# Patient Record
Sex: Female | Born: 1965 | Race: White | Hispanic: No | Marital: Married
Health system: Midwestern US, Community
[De-identification: ages and names within clinical notes are randomized; demographics above are authoritative.]

## PROBLEM LIST (undated history)

## (undated) DIAGNOSIS — R0602 Shortness of breath: Secondary | ICD-10-CM

## (undated) DIAGNOSIS — R251 Tremor, unspecified: Secondary | ICD-10-CM

## (undated) DIAGNOSIS — R52 Pain, unspecified: Secondary | ICD-10-CM

## (undated) DIAGNOSIS — K224 Dyskinesia of esophagus: Secondary | ICD-10-CM

## (undated) DIAGNOSIS — M545 Low back pain, unspecified: Secondary | ICD-10-CM

## (undated) DIAGNOSIS — J321 Chronic frontal sinusitis: Secondary | ICD-10-CM

## (undated) DIAGNOSIS — Z1231 Encounter for screening mammogram for malignant neoplasm of breast: Secondary | ICD-10-CM

## (undated) DIAGNOSIS — G4733 Obstructive sleep apnea (adult) (pediatric): Secondary | ICD-10-CM

## (undated) DIAGNOSIS — M810 Age-related osteoporosis without current pathological fracture: Secondary | ICD-10-CM

## (undated) DIAGNOSIS — M25552 Pain in left hip: Secondary | ICD-10-CM

## (undated) DIAGNOSIS — G250 Essential tremor: Secondary | ICD-10-CM

## (undated) DIAGNOSIS — M79605 Pain in left leg: Secondary | ICD-10-CM

## (undated) DIAGNOSIS — M5126 Other intervertebral disc displacement, lumbar region: Secondary | ICD-10-CM

## (undated) DIAGNOSIS — F419 Anxiety disorder, unspecified: Secondary | ICD-10-CM

## (undated) DIAGNOSIS — M5020 Other cervical disc displacement, unspecified cervical region: Secondary | ICD-10-CM

---

## 2010-01-09 LAB — CBC WITH AUTOMATED DIFF
ABS. BASOPHILS: 0 10*3/uL (ref 0.0–0.1)
ABS. EOSINOPHILS: 0.1 10*3/uL (ref 0.0–0.5)
ABS. LYMPHOCYTES: 2.1 10*3/uL (ref 0.8–3.5)
ABS. MONOCYTES: 0.5 10*3/uL — ABNORMAL LOW (ref 0.8–3.5)
ABS. NEUTROPHILS: 5.6 10*3/uL (ref 1.5–8.0)
BASOPHILS: 0 % (ref 0–2)
EOSINOPHILS: 1 % (ref 0–5)
HCT: 40.9 % — ABNORMAL LOW (ref 41–53)
HGB: 14.4 g/dL (ref 12.0–16.0)
LYMPHOCYTES: 25 % (ref 19–48)
MCH: 32.3 PG — ABNORMAL HIGH (ref 27–31)
MCHC: 35.3 g/dL (ref 31–37)
MCV: 91.7 FL (ref 80–100)
MONOCYTES: 6 % (ref 3–9)
MPV: 6.7 FL (ref 5.9–10.3)
NEUTROPHILS: 67 % (ref 40–74)
PLATELET: 226 10*3/uL (ref 130–400)
RBC: 4.46 M/uL (ref 4.2–5.4)
RDW: 14 % (ref 11.5–14.5)
WBC: 8.3 10*3/uL (ref 4.5–10.8)

## 2010-01-09 LAB — HEPATIC FUNCTION PANEL
A-G Ratio: 0.9 — ABNORMAL LOW (ref 1.2–2.2)
ALT (SGPT): 23 U/L — ABNORMAL LOW (ref 30–65)
AST (SGOT): 9 U/L — ABNORMAL LOW (ref 15–37)
Albumin: 3.7 g/dL (ref 3.4–5.0)
Alk. phosphatase: 102 U/L (ref 50–136)
Bilirubin, direct: 0.1 MG/DL (ref 0.0–0.2)
Bilirubin, total: 0.7 MG/DL (ref 0.2–1.0)
Globulin: 4 g/dL — ABNORMAL HIGH (ref 2.4–3.5)
Protein, total: 7.7 g/dL (ref 6.4–8.2)

## 2010-01-09 LAB — AMYLASE: Amylase: 53 U/L (ref 25–115)

## 2010-01-09 LAB — LIPASE: Lipase: 276 U/L (ref 114–286)

## 2010-01-09 LAB — METABOLIC PANEL, BASIC
Anion gap: 8 mmol/L (ref 6–15)
BUN/Creatinine ratio: 9 (ref 7–25)
BUN: 6 MG/DL — ABNORMAL LOW (ref 7–18)
CO2: 28 MMOL/L (ref 21–32)
Calcium: 9.2 MG/DL (ref 8.5–10.1)
Chloride: 100 MMOL/L (ref 98–107)
Creatinine: 0.7 MG/DL (ref 0.6–1.3)
GFR est AA: >60 mL/min/{1.73_m2} (ref >60)
GFR est non-AA: >60 mL/min/{1.73_m2} (ref >60)
Glucose: 89 MG/DL (ref 70–110)
Potassium: 3.5 MMOL/L (ref 3.5–5.1)
Sodium: 135 MMOL/L — ABNORMAL LOW (ref 136–145)

## 2010-01-17 LAB — C DIFFICILE TOXIN BY EIA: C. difficile toxin - EIA: NEGATIVE

## 2010-01-20 LAB — CULTURE, STOOL

## 2010-01-21 LAB — OVA & PARASITES, STOOL
O&P, Trichrome stain: NEGATIVE
Ova & Parasite exam: NEGATIVE

## 2010-01-25 NOTE — Op Note (Unsigned)
[facility]      PT Name: Amber [name], Amber [name] Admitted: 01/25/2010  MR#: [id number] DOB: [birth year]  Account #: [id number] Age: 44  Dictator: Ned Grace, MD Location:          OPERATIVE REPORT    SURGERY DATE: 01/25/2010    PREOPERATIVE DIAGNOSIS:  Rule out ulcer. Rule out colitis. Rule out malignancy.    POSTOPERATIVE DIAGNOSIS:  1) Gastric bezoar. 2) Internal hemorrhoids.    OPERATION:  1) Esophagogastroduodenoscopy with biopsy.  2) Colonoscopy with biopsy.    INDICATIONS:  Nausea. Vomiting. Abdominal pain. Diarrhea.    MEDICATIONS:  Fentanyl 100 mcg IV push. Versed 6 mg IV push.    FINDINGS:  Following informed consent, the video gastroscope was advanced under  direct vision into the esophagus. The tubular esophagus was  endoscopically normal. There was no significant hiatal hernia  present. The scope was passed into the gastric lumen. There was a  large gastric bezoar along the greater curve. The visualized  gastric mucosa was unremarkable and without inflammatory change.  The pylorus was unremarkable. Scope was passed into the duodenal  bulb and descending duodenum. The duodenal mucosa was normal.  Biopsies were taken of the duodenal mucosa to rule out sprue. The  gastroscope was removed.    Colonoscope was placed and advanced to the cecum. Cecum was  identified by visualization of the appendiceal orifice and ileocecal  valve. The cecum, right colon, transverse colon, descending colon,  sigmoid region and rectum were unremarkable. No polyps, mass  lesions or inflammatory changes were noted. No significant  diverticulosis was seen. Random biopsies were taken to rule out  microscopic colitis. A retroflex examination of the rectum  revealed small internal hemorrhoids. The patient tolerated the  procedure without any obvious complication.    IMPRESSION:  1) Gastric bezoar suggesting underlying gastroparesis.  2) Hemorrhoids.      RECOMMENDATIONS:  1) Gastric Emptying Scan will be obtained.   2) She can follow up in our office in 3-4 weeks.          _____________________________  Ned Grace, M.D.    RM:df DD: 01/25/2010 09:12:47 DT: 01/25/2010 13:18:23 Job ID:  [id number]  CC:Dr. Chales Abrahams

## 2010-01-25 NOTE — Op Note (Unsigned)
[facility]      PT Name: Amber [name], Amber [name] Admitted: 01/25/2010  MR#: [id number] DOB: 24-Jun-1966  Account #: [id number] Age: 44  Dictator: Ned Grace, MD Location:          OPERATIVE REPORT    SURGERY DATE: 01/25/2010    PREOPERATIVE DIAGNOSIS:  Rule out ulcer. Rule out colitis. Rule out malignancy.    POSTOPERATIVE DIAGNOSIS:  1) Gastric bezoar. 2) Internal hemorrhoids.    OPERATION:  1) Esophagogastroduodenoscopy with biopsy.  2) Colonoscopy with biopsy.    INDICATIONS:  Nausea. Vomiting. Abdominal pain. Diarrhea.    MEDICATIONS:  Fentanyl 100 mcg IV push. Versed 6 mg IV push.    FINDINGS:  Following informed consent, the video gastroscope was advanced under  direct vision into the esophagus. The tubular esophagus was  endoscopically normal. There was no significant hiatal hernia  present. The scope was passed into the gastric lumen. There was a  large gastric bezoar along the greater curve. The visualized  gastric mucosa was unremarkable and without inflammatory change.  The pylorus was unremarkable. Scope was passed into the duodenal  bulb and descending duodenum. The duodenal mucosa was normal.  Biopsies were taken of the duodenal mucosa to rule out sprue. The  gastroscope was removed.    Colonoscope was placed and advanced to the cecum. Cecum was  identified by visualization of the appendiceal orifice and ileocecal  valve. The cecum, right colon, transverse colon, descending colon,  sigmoid region and rectum were unremarkable. No polyps, mass  lesions or inflammatory changes were noted. No significant  diverticulosis was seen. Random biopsies were taken to rule out  microscopic colitis. A retroflex examination of the rectum  revealed small internal hemorrhoids. The patient tolerated the  procedure without any obvious complication.    IMPRESSION:  1) Gastric bezoar suggesting underlying gastroparesis.  2) Hemorrhoids.      RECOMMENDATIONS:  1) Gastric Emptying Scan will be obtained.   2) She can follow up in our office in 3-4 weeks.         Electronically signed by Ned Grace, M.D.  on 01/25/2010 15:15:32        _____________________________  Ned Grace, M.D.    RM:df DD: 01/25/2010 09:12:47 DT: 01/25/2010 13:18:23 Job ID:  [id number]  CC:Dr. Chales Abrahams

## 2010-03-15 NOTE — Procedures (Unsigned)
[facility]      PT Name: Amber [name], Amber [name] Admitted: 03/13/2010  MR#: [id number] DOB: [birth year]  Account #: [id number] Age: 44  Dictator: Elayne Snare, MD Location:      DOB: [birth year] Age: 43Y     BLOOD MONITOR REPORT      DATE: 03/13/2010    PROCEDURE:  Patient was monitored from 03/13/2010 at 1 p.m. to 03/14/2010 6:45  p.m. Total of 23 out of 30 was accepted.    FINDINGS:  Throughout monitoring blood pressure was well maintained. Maximum  systolic blood pressure was 127 with diastolic of 91 at 12 p.m. The  rest of the time blood pressure was fairly well controlled. Heart  rate was well controlled from 70 to 100 beats per minute.    CONCLUSION:  Fairly well blood pressure control throughout monitoring. Maximum  blood pressure was 127/91. Her lowest blood pressure noted was  82/51 at 8 p.m. with heart rate of 61. The rest of the time, blood  pressure was within normal limits.          _______________________________________  Elayne Snare, M.D.    YP:tds DD: 03/15/2010 12:54:49 DT: 03/15/2010 17:03:28 Job ID:  [id number]    CC:        Cira Rue  [id number]  03/13/2010 Admission        Page 1 HOLTER MONITOR REPORT

## 2010-03-23 NOTE — Procedures (Unsigned)
[facility]      PT Name: Amber [name], Amber [name] Admitted: 03/23/2010  MR#: [id number] DOB: Sep 10, 1966  Account #: [id number] Age: 44  Dictator: Elon Jester A. Shacora Zynda, MD Location:         Cardiology - GXT - DRUG INDUCED TEST  Reason for GXT: Hypertension, chest pain  Medications: Xanax, BuSpar, Lisinopril, Prilosec  Allergies: PENICILLIN, TOPAMAX, CIPRO, MIGRAINE SPRAY  INTERPRETATION OF STRESS TEST:  Protocol: Lexiscan Age: 43Y Height: 65" Weight: 172  Sex: F Race:    * * * LEXISCAN 0.4 MG ADMIN. * * *    * * * POST EXERCISE RESPONSE * * *    HEART - - PREDICTED MHR: MHR ACHIEVED: % PEAK HR ACHIEVED:  TOTAL TIME: TARGET (85% MHR): STAGE: TIME:    SYMPTOMS/REASON FOR STOPPING: No chest pain.  PRE-EXERCISE ECG: Normal sinus rhythm. PRNP, no acute STT  changes.  EKG RESPONSE: No significant ST segment changes noted. No  arrhythmia.  PEAK ACTIVITY LEVEL: METS. MAXIMUM 02 CONSUMPTION (VO2):  INDEX OF CARDIAC WORK: FITNESS CLASSIFICATION:    INTERPRETATION:  1. Negative electrocardiographic test in response to Lexiscan  2. Myoview distribution report is pending.        _______________________  Sallyanne Kuster. Dwayna Kentner, M.D.    MAF:tds DD: 03/23/2010 00:00:00 DT: 03/23/2010 13:41:25    CC:

## 2010-05-27 LAB — HCG QL SERUM: HCG, Ql.: NEGATIVE

## 2010-05-27 LAB — CBC WITH AUTOMATED DIFF
ABS. BASOPHILS: 0.2 10*3/uL — ABNORMAL HIGH (ref 0.0–0.1)
ABS. EOSINOPHILS: 0 10*3/uL (ref 0.0–0.5)
ABS. LYMPHOCYTES: 1.3 10*3/uL (ref 0.8–3.5)
ABS. MONOCYTES: 0.3 10*3/uL — ABNORMAL LOW (ref 0.8–3.5)
ABS. NEUTROPHILS: 8.1 10*3/uL — ABNORMAL HIGH (ref 1.5–8.0)
BASOPHILS: 2 % (ref 0–2)
EOSINOPHILS: 0 % (ref 0–5)
HCT: 43.6 % (ref 41–53)
HGB: 15.4 g/dL (ref 12.0–16.0)
LYMPHOCYTES: 13 % — ABNORMAL LOW (ref 19–48)
MCH: 31.9 PG — ABNORMAL HIGH (ref 27–31)
MCHC: 35.3 g/dL (ref 31–37)
MCV: 90.5 FL (ref 80–100)
MONOCYTES: 3 % (ref 3–9)
MPV: 6.6 FL (ref 5.9–10.3)
NEUTROPHILS: 82 % — ABNORMAL HIGH (ref 40–74)
PLATELET: 227 10*3/uL (ref 130–400)
RBC: 4.82 M/uL (ref 4.2–5.4)
RDW: 12.7 % (ref 11.5–14.5)
WBC: 9.9 10*3/uL (ref 4.5–10.8)

## 2010-05-27 LAB — URINE MICROSCOPIC

## 2010-05-27 LAB — URINALYSIS W/ RFLX MICROSCOPIC
Blood: NEGATIVE
Glucose: NEGATIVE MG/DL
Ketone: >80 MG/DL — AB
Leukocyte Esterase: NEGATIVE
Nitrites: NEGATIVE
Specific gravity: 1.025 (ref 1.002–1.030)
Urobilinogen: 0.2 EU/DL (ref 0–1)
pH (UA): 6 (ref 4.5–8.0)

## 2010-05-27 LAB — METABOLIC PANEL, BASIC
Anion gap: 11 mmol/L (ref 6–15)
BUN/Creatinine ratio: 8 (ref 7–25)
BUN: 5 MG/DL — ABNORMAL LOW (ref 7–18)
CO2: 21 MMOL/L (ref 21–32)
Calcium: 9.5 MG/DL (ref 8.5–10.1)
Chloride: 104 MMOL/L (ref 98–107)
Creatinine: 0.6 MG/DL (ref 0.6–1.3)
GFR est AA: >60 mL/min/{1.73_m2} (ref >60)
GFR est non-AA: >60 mL/min/{1.73_m2} (ref >60)
Glucose: 98 MG/DL (ref 70–110)
Potassium: 3.9 MMOL/L (ref 3.5–5.1)
Sodium: 136 MMOL/L (ref 136–145)

## 2010-06-04 NOTE — Progress Notes (Signed)
Quick Note:    Pt of Dr Loreen Freud, report sent to hom  ______

## 2010-08-09 LAB — DRUG SCREEN, URINE
AMPHETAMINES: NEGATIVE
BARBITURATES: POSITIVE
BENZODIAZEPINES: POSITIVE
COCAINE: NEGATIVE
METHADONE: NEGATIVE
OPIATES: POSITIVE
PCP(PHENCYCLIDINE): NEGATIVE
THC (TH-CANNABINOL): POSITIVE
TRICYCLICS: NEGATIVE

## 2010-08-09 LAB — URINALYSIS W/ RFLX MICROSCOPIC
Bilirubin: NEGATIVE
Blood: NEGATIVE
Glucose: NEGATIVE MG/DL
Ketone: NEGATIVE MG/DL
Leukocyte Esterase: NEGATIVE
Nitrites: NEGATIVE
Protein: NEGATIVE MG/DL
Specific gravity: 1.025 (ref 1.002–1.030)
Urobilinogen: 1 EU/DL (ref 0–1)
pH (UA): 6 (ref 4.5–8.0)

## 2010-08-09 LAB — METABOLIC PANEL, BASIC
Anion gap: 11 mmol/L (ref 6–15)
BUN/Creatinine ratio: 15 (ref 7–25)
BUN: 9 MG/DL (ref 7–18)
CO2: 25 MMOL/L (ref 21–32)
Calcium: 8.7 MG/DL (ref 8.5–10.1)
Chloride: 102 MMOL/L (ref 98–107)
Creatinine: 0.6 MG/DL (ref 0.6–1.3)
GFR est AA: >60 mL/min/{1.73_m2} (ref >60)
GFR est non-AA: >60 mL/min/{1.73_m2} (ref >60)
Glucose: 90 MG/DL (ref 70–110)
Potassium: 4.2 MMOL/L (ref 3.5–5.1)
Sodium: 138 MMOL/L (ref 136–145)

## 2010-08-09 LAB — URINE MICROSCOPIC

## 2010-08-09 LAB — CBC WITH AUTOMATED DIFF
ABS. BASOPHILS: 0.1 10*3/uL (ref 0.0–0.1)
ABS. EOSINOPHILS: 0 10*3/uL (ref 0.0–0.5)
ABS. LYMPHOCYTES: 1.9 10*3/uL (ref 0.8–3.5)
ABS. MONOCYTES: 0.6 10*3/uL — ABNORMAL LOW (ref 0.8–3.5)
ABS. NEUTROPHILS: 7 10*3/uL (ref 1.5–8.0)
BASOPHILS: 1 % (ref 0–2)
EOSINOPHILS: 0 % (ref 0–5)
HCT: 37.2 % — ABNORMAL LOW (ref 41–53)
HGB: 13.1 g/dL (ref 12.0–16.0)
LYMPHOCYTES: 20 % (ref 19–48)
MCH: 31.5 PG — ABNORMAL HIGH (ref 27–31)
MCHC: 35.2 g/dL (ref 31–37)
MCV: 89.5 FL (ref 80–100)
MONOCYTES: 7 % (ref 3–9)
MPV: 7.3 FL (ref 5.9–10.3)
NEUTROPHILS: 72 % (ref 40–74)
PLATELET: 190 10*3/uL (ref 130–400)
RBC: 4.15 M/uL — ABNORMAL LOW (ref 4.2–5.4)
RDW: 13 % (ref 11.5–14.5)
WBC: 9.7 10*3/uL (ref 4.5–10.8)

## 2010-08-09 LAB — LIPASE: Lipase: 160 U/L (ref 73–393)

## 2010-08-09 LAB — HEPATIC FUNCTION PANEL
A-G Ratio: 0.7 — ABNORMAL LOW (ref 1.2–2.2)
ALT (SGPT): 20 U/L — ABNORMAL LOW (ref 30–65)
AST (SGOT): 13 U/L — ABNORMAL LOW (ref 15–37)
Albumin: 3 g/dL — ABNORMAL LOW (ref 3.4–5.0)
Alk. phosphatase: 100 U/L (ref 50–136)
Bilirubin, direct: <0.1 MG/DL (ref 0.0–0.2)
Bilirubin, total: 0.1 MG/DL — ABNORMAL LOW (ref 0.2–1.0)
Globulin: 4.1 g/dL — ABNORMAL HIGH (ref 2.4–3.5)
Protein, total: 7.1 g/dL (ref 6.4–8.2)

## 2010-08-09 LAB — HCG QL SERUM: HCG, Ql.: NEGATIVE

## 2010-08-09 MED ORDER — SALINE PERIPHERAL FLUSH Q8H
Freq: Three times a day (TID) | INTRAMUSCULAR | Status: DC
Start: 2010-08-09 — End: 2010-08-09

## 2010-08-09 MED ORDER — ONDANSETRON (PF) 4 MG/2 ML INJECTION
4 mg/2 mL | Freq: Once | INTRAMUSCULAR | Status: AC
Start: 2010-08-09 — End: 2010-08-09
  Administered 2010-08-09: 22:00:00 via INTRAVENOUS

## 2010-08-09 MED ORDER — SALINE PERIPHERAL FLUSH PRN
INTRAMUSCULAR | Status: DC | PRN
Start: 2010-08-09 — End: 2010-08-09

## 2010-08-09 NOTE — ED Notes (Deleted)
Presents to triage with c/o abdominal pain RUQ and nausea since Monday.

## 2010-08-09 NOTE — Progress Notes (Signed)
I have reviewed discharge instructions with the patient.  The patient verbalized understanding.

## 2010-08-09 NOTE — ED Notes (Signed)
Presents to triage with c/o menstrual cramps x 1 month. Also c/o rectal bleeding secondary to IBS.

## 2010-08-09 NOTE — Progress Notes (Signed)
Ct abd/pelvis complete 6:23 PM  Amber [name] J Jmya Uliano

## 2010-08-10 NOTE — ED Provider Notes (Signed)
Patient is a 44 y.o. female presenting with abdominal pain and anal bleeding. The history is provided by the patient. No language interpreter was used.   Abdominal Pain   This is a new problem. The current episode started more than 1 week ago. The problem occurs daily. The problem has not changed since onset. The pain is located in the generalized abdominal region. The quality of the pain is dull. The pain is mild. Pertinent negatives include no fever, no diarrhea, no flatus, no melena, no nausea, no vomiting, no dysuria, no frequency, no hematuria, no headaches, no chest pain and no back pain. Nothing worsens the pain. The pain is relieved by nothing. Past workup includes no CT scan, no ultrasound, no surgery, no esophagogastroduodenoscopy, no UGI, no colonoscopy and no barium enema. Her past medical history does not include PUD, gallstones, irritable bowel syndrome, cancer or kidney stones.   Rectal Bleeding  This is a new problem. The problem occurs daily. The problem has not changed since onset. Associated symptoms include abdominal pain. Pertinent negatives include no chest pain, no headaches and no shortness of breath. Nothing aggravates the symptoms. Nothing relieves the symptoms.        Past Medical History   Diagnosis Date   ??? Neuropathy, diabetic    ??? Neuropathy      gastoparesis   ??? IBS (irritable bowel syndrome)    ??? HTN (hypertension)           Past Surgical History   Procedure Date   ??? Delivery c-section    ??? Hx tubal ligation    ??? Hx cholecystectomy            No family history on file.     History   Social History   ??? Marital Status: Married     Spouse Name: N/A     Number of Children: N/A   ??? Years of Education: N/A   Occupational History   ??? Not on file.   Social History Main Topics   ??? Smoking status: Current Everyday Smoker -- 1.0 packs/day for 27 years   ??? Smokeless tobacco: Never Used   ??? Alcohol Use: No   ??? Drug Use: 6 per week     Special: Opiates, Marijuana      vicodin or lortab    ??? Sexually Active: Yes -- Female partner(s)     Birth Control/ Protection: Surgical   Other Topics Concern   ??? Not on file   Social History Narrative   ??? No narrative on file                    ALLERGIES: Topamax, Nasal spray, Ciprofloxacin, Aspirin, Pcn and Levsin      Review of Systems   Constitutional: Negative for fever, chills and diaphoresis.   HENT: Negative for hearing loss, ear pain, facial swelling, trouble swallowing, neck pain, neck stiffness and ear discharge.    Eyes: Negative for pain, redness and visual disturbance.   Respiratory: Negative for cough, choking, chest tightness, shortness of breath and wheezing.    Cardiovascular: Negative for chest pain, palpitations, leg swelling and syncope.   Gastrointestinal: Positive for abdominal pain and anal bleeding. Negative for nausea, vomiting, diarrhea, blood in stool, melena, abdominal distention and flatus.   Genitourinary: Negative for dysuria, urgency, frequency, hematuria, flank pain and difficulty urinating.   Musculoskeletal: Negative for back pain.   Skin: Negative for pallor and rash.   Neurological: Negative for seizures, syncope, weakness,  light-headedness and headaches.   Hematological: Negative for adenopathy. Does not bruise/bleed easily.   Psychiatric/Behavioral: Negative for behavioral problems, confusion and agitation.   All other systems reviewed and are negative.        Filed Vitals:    08/09/10 1648 08/09/10 2115   BP: 122/91 120/88   Pulse: 107    Temp: 97.8 ??F (36.6 ??C) 98.6 ??F (37 ??C)   Resp: 16 16   Height: 5\' 5"  (1.651 m)    Weight: 149 lb (67.586 kg)    SpO2: 100% 99%              Physical Exam   Nursing note and vitals reviewed.  Constitutional: She is oriented to person, place, and time. She appears well-developed and well-nourished. No distress.   HENT:   Head: Normocephalic and atraumatic.   Nose: Nose normal.    Eyes: Conjunctivae and EOM are normal. Pupils are equal, round, and reactive to light. Right eye exhibits no discharge. Left eye exhibits no discharge. No scleral icterus.   Neck: Normal range of motion. Neck supple. No tracheal deviation present.   Cardiovascular: Normal rate, regular rhythm, normal heart sounds and intact distal pulses.  Exam reveals no gallop and no friction rub.    No murmur heard.  Pulmonary/Chest: Effort normal and breath sounds normal. No respiratory distress. She has no wheezes. She has no rales.   Abdominal: Soft. Bowel sounds are normal. She exhibits no distension and no mass. No tenderness. She has no guarding.   Musculoskeletal: Normal range of motion. She exhibits no edema and no tenderness.   Lymphadenopathy:     She has no cervical adenopathy.   Neurological: She is alert and oriented to person, place, and time. She has normal reflexes. No cranial nerve deficit. Coordination normal.   Skin: Skin is warm and dry. No rash noted.   Psychiatric: She has a normal mood and affect. Her behavior is normal.        MDM     Amount and/or Complexity of Data Reviewed:   Clinical lab tests:  Ordered and reviewed  Tests in the medicine section of the CPT??:  Reviewed and ordered  Discussion of test results with the performing providers:  No   Decide to obtain previous medical records or to obtain history from someone other than the patient:  Yes   Obtain history from someone other than the patient:  Yes   Review and summarize past medical records:  Yes   Discuss the patient with another provider:  No   Independant visualization of image, tracing, or specimen:  No      Procedures

## 2010-08-21 MED ORDER — DEXAMETHASONE SODIUM PHOSPHATE 10 MG/ML IJ SOLN
10 mg/mL | Freq: Once | INTRAMUSCULAR | Status: AC
Start: 2010-08-21 — End: 2010-08-21
  Administered 2010-08-21: 06:00:00 via INTRAVENOUS

## 2010-08-21 MED ORDER — DIPHENHYDRAMINE HCL 50 MG/ML IJ SOLN
50 mg/mL | Freq: Once | INTRAMUSCULAR | Status: AC
Start: 2010-08-21 — End: 2010-08-21
  Administered 2010-08-21: 06:00:00 via INTRAVENOUS

## 2010-08-21 MED ORDER — PREDNISONE 10 MG TABLETS IN A DOSE PACK
10 mg | ORAL_TABLET | ORAL | Status: DC
Start: 2010-08-21 — End: 2011-08-28

## 2010-08-21 NOTE — ED Notes (Signed)
Patient c/o itching and hives X 2 days.  Patient had a seizure and was taken to [facility] on Sunday.  Patient states she doesn't know if the hives are from her nerves or from a sheet she had over her at [facility].

## 2010-08-21 NOTE — ED Provider Notes (Signed)
HPI Comments: Pt c/o hives for two days. She was seen at [facility] for a seizure 2 days ago. She reports that she was not given any medications. She reports hives started on her way home from the hospital. She has been using benadryl at home since.     Patient is a 44 y.o. female presenting with allergic reaction. The history is provided by the patient.   Allergic Reaction  This is a new problem. The current episode started 2 days ago. The problem occurs constantly. The problem has been gradually worsening. Pertinent negatives include no chest pain, no abdominal pain, no headaches and no shortness of breath. Nothing aggravates the symptoms. Nothing relieves the symptoms. She has tried Benadryl for the symptoms. The treatment provided no relief.        Past Medical History   Diagnosis Date   ??? Neuropathy, diabetic    ??? Neuropathy      gastoparesis   ??? IBS (irritable bowel syndrome)    ??? HTN (hypertension)           Past Surgical History   Procedure Date   ??? Delivery c-section    ??? Hx tubal ligation    ??? Hx cholecystectomy            No family history on file.     History   Social History   ??? Marital Status: Married     Spouse Name: N/A     Number of Children: N/A   ??? Years of Education: N/A   Occupational History   ??? Not on file.   Social History Main Topics   ??? Smoking status: Current Everyday Smoker -- 1.0 packs/day for 27 years   ??? Smokeless tobacco: Never Used   ??? Alcohol Use: No   ??? Drug Use: 6 per week     Special: Opiates, Marijuana      vicodin or lortab   ??? Sexually Active: Yes -- Female partner(s)     Birth Control/ Protection: Surgical   Other Topics Concern   ??? Not on file   Social History Narrative   ??? No narrative on file                    ALLERGIES: Topamax, Nasal spray, Ciprofloxacin, Aspirin, Pcn and Levsin      Review of Systems   Constitutional: Negative for fever, chills, appetite change, fatigue and unexpected weight change.    HENT: Negative for ear pain, congestion, rhinorrhea, drooling, neck pain, neck stiffness, dental problem, sinus pressure and ear discharge.    Eyes: Negative for pain, discharge, redness, itching and visual disturbance.   Respiratory: Negative for cough, chest tightness, shortness of breath and wheezing.    Cardiovascular: Negative for chest pain, palpitations and leg swelling.   Gastrointestinal: Negative for abdominal pain, constipation, blood in stool, abdominal distention and anal bleeding.   Genitourinary: Negative for urgency, frequency, hematuria, flank pain and difficulty urinating.   Musculoskeletal: Negative for myalgias, back pain, joint swelling, arthralgias and gait problem.   Skin: Positive for color change and rash ( hives).   Neurological: Negative for dizziness, seizures, syncope, speech difficulty, weakness, numbness and headaches.   Hematological: Negative for adenopathy. Does not bruise/bleed easily.   Psychiatric/Behavioral: Negative for hallucinations, behavioral problems, confusion, sleep disturbance, self-injury and agitation. The patient is not nervous/anxious.        Filed Vitals:    08/21/10 0125   BP: 133/76   Pulse: 110  Temp: 97.9 ??F (36.6 ??C)   Resp: 22   Height: 5\' 5"  (1.651 m)   Weight: 147 lb (66.679 kg)   SpO2: 100%              Physical Exam   Nursing note and vitals reviewed.  Constitutional: She is oriented to person, place, and time. Vital signs are normal. She appears well-developed and well-nourished.   HENT:   Head: Normocephalic and atraumatic.   Right Ear: External ear normal.   Left Ear: External ear normal.   Nose: Nose normal.   Mouth/Throat: Oropharynx is clear and moist.   Eyes: Conjunctivae, EOM and lids are normal. Pupils are equal, round, and reactive to light.   Neck: Normal range of motion and full passive range of motion without pain. Neck supple. No Brudzinski's sign and no Kernig's sign noted. No mass and no thyromegaly present.    Cardiovascular: Normal rate, regular rhythm, normal heart sounds, intact distal pulses and normal pulses.    Pulmonary/Chest: Effort normal and breath sounds normal. No accessory muscle usage. No respiratory distress. She has no decreased breath sounds. She has no wheezes. She has no rhonchi. She has no rales.   Abdominal: Soft. Bowel sounds are normal. There is no hepatosplenomegaly. No tenderness. She has no rigidity, no rebound, no guarding, no CVA tenderness, no pain at McBurney's point and no Murphy's sign. No hernia.   Musculoskeletal: Normal range of motion.        Right shoulder: She exhibits normal range of motion, no swelling, no effusion, normal pulse and normal strength.   Lymphadenopathy:     She has no cervical adenopathy.   Neurological: She is alert and oriented to person, place, and time. She has normal strength and normal reflexes.   Skin: Skin is warm, dry and intact. Rash noted. Rash is urticarial.         Psychiatric: She has a normal mood and affect. Her behavior is normal. Judgment and thought content normal.        MDM     Amount and/or Complexity of Data Reviewed:    Discuss the patient with another provider:  Yes  Progress:   Patient progress:  Stable      Procedures

## 2010-08-21 NOTE — ED Notes (Signed)
Discharge instructions and prescription given, patient verbalized her understanding.

## 2010-08-21 NOTE — ED Provider Notes (Signed)
I personally saw and examined the patient.  I have reviewed and agree with the MLP's findings, including all diagnostic interpretations, and plans as written.   I was present during the key portions of separately billed procedures.    Kurstin Dimarzo, MD

## 2011-01-14 NOTE — Patient Instructions (Signed)
[facility] office hours are Monday-Friday 8 am to 5 pm. Our phone number is [phone] and the fax is [phone]. Please bring all medication bottles to each office visit.    Please give at least a 48 hour notice on any medication refills.  Thank you.    An After Visit Summary was printed and given to the patient.    MyChart Activation    Thank you for requesting access to MyChart. Please follow the instructions below to securely access and download your online medical record. MyChart allows you to send messages to your doctor, view your test results, renew your prescriptions, schedule appointments, and more.    How Do I Sign Up?    1. In your internet browser, go to [email or web address].  2. Click on the First Time User? Click Here link in the Sign In box. You will see the New Member Sign Up page.  3. Enter your MyChart Access Code exactly as it appears below. You will not need to use this code after you???ve completed the sign-up process. If you do not sign up before the expiration date, you must request a new code.    MyChart Access Code: [email or web address]  Expires: 04/14/11 02:15 PM (This is the date your MyChart access code will expire)    4. Enter the last four digits of your Social Security Number (xxxx) and Date of Birth (mm/dd/yyyy) as indicated and click Submit. You will be taken to the next sign-up page.  5. Create a MyChart ID. This will be your MyChart login ID and cannot be changed, so think of one that is secure and easy to remember.  6. Create a MyChart password. You can change your password at any time.  7. Enter your Password Reset Question and Answer. This can be used at a later time if you forget your password.   8. Enter your e-mail address. You will receive e-mail notification when new information is available in MyChart.  9. Click Sign Up. You can now view and download portions of your medical record.   10. Click the Download Summary menu link to download a portable copy of your medical information.    Additional Information    If you have questions, please call [phone]. Remember, MyChart is NOT to be used for urgent needs. For medical emergencies, dial 911.

## 2011-01-14 NOTE — Progress Notes (Signed)
A user error has taken place: encounter opened in error, closed for administrative reasons.

## 2011-03-11 NOTE — Progress Notes (Signed)
Screening mammogram performed without complication.

## 2011-03-27 LAB — METABOLIC PANEL, COMPREHENSIVE
A-G Ratio: 1.2 (ref 1.2–2.2)
ALT (SGPT): 16 U/L (ref 12–78)
AST (SGOT): 10 U/L — ABNORMAL LOW (ref 15–37)
Albumin: 4.2 g/dL (ref 3.4–5.0)
Alk. phosphatase: 132 U/L (ref 50–136)
Anion gap: 6 mmol/L (ref 6–15)
BUN/Creatinine ratio: 8 (ref 7–25)
BUN: 4 MG/DL — ABNORMAL LOW (ref 7–18)
Bilirubin, total: 0.5 MG/DL (ref <0.8)
CO2: 29 MMOL/L (ref 21–32)
Calcium: 8.8 MG/DL (ref 8.5–10.1)
Chloride: 101 MMOL/L (ref 98–107)
Creatinine: 0.5 MG/DL — ABNORMAL LOW (ref 0.6–1.3)
GFR est AA: >60 mL/min/{1.73_m2} (ref >60)
GFR est non-AA: >60 mL/min/{1.73_m2} (ref >60)
Globulin: 3.6 g/dL — ABNORMAL HIGH (ref 2.4–3.5)
Glucose: 88 MG/DL (ref 70–110)
Potassium: 4 MMOL/L (ref 3.5–5.3)
Protein, total: 7.8 g/dL (ref 6.4–8.2)
Sodium: 136 MMOL/L (ref 136–145)

## 2011-03-27 LAB — CBC WITH AUTOMATED DIFF
ABS. BASOPHILS: 0 10*3/uL (ref 0.0–0.1)
ABS. EOSINOPHILS: 0 10*3/uL (ref 0.0–0.5)
ABS. LYMPHOCYTES: 2.5 10*3/uL (ref 0.8–3.5)
ABS. MONOCYTES: 0.3 10*3/uL — ABNORMAL LOW (ref 0.8–3.5)
ABS. NEUTROPHILS: 3.9 10*3/uL (ref 1.5–8.0)
BASOPHILS: 0 % (ref 0–2)
EOSINOPHILS: 0 % (ref 0–5)
HCT: 42.5 % (ref 41–53)
HGB: 14.8 g/dL (ref 12.0–16.0)
LYMPHOCYTES: 37 % (ref 19–48)
MCH: 30.1 PG (ref 27–31)
MCHC: 34.8 g/dL (ref 31–37)
MCV: 86.4 FL (ref 80–100)
MONOCYTES: 5 % (ref 3–9)
MPV: 8.8 FL (ref 5.9–10.3)
NEUTROPHILS: 58 % (ref 40–74)
PLATELET: 132 10*3/uL (ref 130–400)
RBC: 4.92 M/uL (ref 4.2–5.4)
RDW: 14.4 % (ref 11.5–14.5)
WBC: 6.8 10*3/uL (ref 4.5–10.8)

## 2011-03-27 LAB — DRUG SCREEN, URINE
AMPHETAMINES: NEGATIVE
BARBITURATES: POSITIVE
BENZODIAZEPINES: POSITIVE
COCAINE: NEGATIVE
METHADONE: NEGATIVE
OPIATES: NEGATIVE
PCP(PHENCYCLIDINE): NEGATIVE
THC (TH-CANNABINOL): POSITIVE
TRICYCLICS: NEGATIVE

## 2011-03-27 LAB — THYROID PANEL W/TSH
Free thyroxine index: 0.6 — ABNORMAL LOW (ref 1.4–5.2)
T3 Uptake: 28 % — ABNORMAL LOW (ref 31–39)
T4, Total: 2.2 ug/dL — ABNORMAL LOW (ref 4.7–13.3)
TSH: 21.2 u[IU]/mL — ABNORMAL HIGH (ref 0.35–3.74)

## 2011-03-27 MED ORDER — ONDANSETRON (PF) 4 MG/2 ML INJECTION
4 mg/2 mL | INTRAMUSCULAR | Status: AC
Start: 2011-03-27 — End: 2011-03-27
  Administered 2011-03-27: 23:00:00 via INTRAMUSCULAR

## 2011-03-27 MED ORDER — MEPERIDINE (PF) 25 MG/ML INJ SOLUTION
25 mg/ml | INTRAMUSCULAR | Status: AC
Start: 2011-03-27 — End: 2011-03-27
  Administered 2011-03-27: 23:00:00 via INTRAMUSCULAR

## 2011-03-27 MED ORDER — BUTALBITAL-ACETAMINOPHEN-CAFFEINE 50 MG-325 MG-40 MG TAB
50-325-40 mg | ORAL | Status: DC | PRN
Start: 2011-03-27 — End: 2011-03-27
  Administered 2011-03-27: 23:00:00 via ORAL

## 2011-03-27 NOTE — ED Notes (Signed)
Discharge instructions given to pt.  No questions voiced.  Ambulated to lobby without difficulty.

## 2011-03-27 NOTE — Progress Notes (Signed)
CT HEAD WITHOUT CONTRAST COMPLETED  Kyla S Blevins RT(R)(CT)(MR)

## 2011-03-27 NOTE — ED Provider Notes (Signed)
Patient is a 45 y.o. female presenting with hypertension and headaches. The history is provided by the patient.   Hypertension   This is a new problem. Associated symptoms include headaches. Pertinent negatives include no chest pain, no palpitations, no confusion, no neck pain, no dizziness and no shortness of breath.   Headache   Pertinent negatives include no fever, no palpitations, no shortness of breath, no weakness and no dizziness.        Past Medical History   Diagnosis Date   ??? Neuropathy, diabetic    ??? HTN (hypertension)    ??? IBS (irritable bowel syndrome)         Past Surgical History   Procedure Date   ??? Delivery c-section    ??? Hx cholecystectomy    ??? Hx tubal ligation      hysterectomy         No family history on file.     History     Social History   ??? Marital Status: Married     Spouse Name: N/A     Number of Children: N/A   ??? Years of Education: N/A     Occupational History   ??? Not on file.     Social History Main Topics   ??? Smoking status: Current Everyday Smoker -- 1.0 packs/day for 27 years   ??? Smokeless tobacco: Never Used   ??? Alcohol Use: No   ??? Drug Use: 6 per week     Special: Marijuana      vicodin or lortab   ??? Sexually Active: Yes -- Female partner(s)     Birth Control/ Protection: Surgical     Other Topics Concern   ??? Not on file     Social History Narrative   ??? No narrative on file                  ALLERGIES: Topamax; Nasal spray; Aspirin; Ciprofloxacin; Levsin; and Pcn      Review of Systems   Constitutional: Negative for fever, chills, appetite change, fatigue and unexpected weight change.   HENT: Negative for ear pain, congestion, rhinorrhea, drooling, neck pain, neck stiffness, dental problem, sinus pressure and ear discharge.    Eyes: Negative for pain, discharge, redness, itching and visual disturbance.   Respiratory: Negative for cough, chest tightness, shortness of breath and wheezing.    Cardiovascular: Negative for chest pain, palpitations and leg swelling.    Gastrointestinal: Negative for abdominal pain, constipation, blood in stool, abdominal distention and anal bleeding.   Genitourinary: Negative for urgency, frequency, hematuria, flank pain and difficulty urinating.   Musculoskeletal: Negative for myalgias, back pain, joint swelling, arthralgias and gait problem.   Skin: Negative for color change and rash.   Neurological: Positive for headaches. Negative for dizziness, seizures, syncope, speech difficulty, weakness and numbness.   Hematological: Negative for adenopathy. Does not bruise/bleed easily.   Psychiatric/Behavioral: Negative for hallucinations, behavioral problems, confusion, sleep disturbance, self-injury and agitation. The patient is not nervous/anxious.        Filed Vitals:    03/27/11 1709   BP: 157/97   Temp: 97.8 ??F (36.6 ??C)   Resp: 18   Height: 5\' 5"  (1.651 m)   Weight: 135 lb (61.236 kg)   SpO2: 99%            Physical Exam   [nursing notereviewed.  Constitutional: She is oriented to person, place, and time. Vital signs are normal. She appears well-developed and well-nourished.  HENT:   Head: Normocephalic and atraumatic.   Right Ear: External ear normal.   Left Ear: External ear normal.   Nose: Nose normal.   Mouth/Throat: Oropharynx is clear and moist.   Eyes: Conjunctivae, EOM and lids are normal. Pupils are equal, round, and reactive to light.   Neck: Normal range of motion and full passive range of motion without pain. Neck supple. No Brudzinski's sign and no Kernig's sign noted. No mass and no thyromegaly present.   Cardiovascular: Normal rate, regular rhythm, normal heart sounds, intact distal pulses and normal pulses.    Pulmonary/Chest: Effort normal and breath sounds normal. No accessory muscle usage. No respiratory distress. She has no decreased breath sounds. She has no wheezes. She has no rhonchi. She has no rales.    Abdominal: Soft. Bowel sounds are normal. There is no hepatosplenomegaly. There is no tenderness. There is no rigidity, no rebound, no guarding, no CVA tenderness, no tenderness at McBurney's point and negative Murphy's sign. No hernia.   Musculoskeletal: Normal range of motion.        Right shoulder: She exhibits normal range of motion, no swelling, no effusion, normal pulse and normal strength.   Lymphadenopathy:     She has no cervical adenopathy.   Neurological: She is alert and oriented to person, place, and time. She has normal strength and normal reflexes.   Skin: Skin is warm, dry and intact.   Psychiatric: She has a normal mood and affect. Her behavior is normal. Judgment and thought content normal.        MDM     Amount and/or Complexity of Data Reviewed:   Clinical lab tests:  [ordered and reviewed  Tests in the radiology section of CPT??:  [ordered and reviewed  Tests in the medicine section of the CPT??:  [ordered and reviewed  Discussion of test results with the performing providers:  Yes   Decide to obtain previous medical records or to obtain history from someone other than the patient:  Yes   Obtain history from someone other than the patient:  Yes   Review and summarize past medical records:  Yes   Discuss the patient with another provider:  Yes   Independant visualization of image, tracing, or specimen:  Yes      Procedures

## 2011-03-27 NOTE — ED Provider Notes (Signed)
I personally saw and examined the patient.  I have reviewed and agree with the MLP's findings, including all diagnostic interpretations, and plans as written.    Etsuko Dierolf, MD

## 2011-03-27 NOTE — ED Notes (Signed)
High blood pressure and headache for 3 days and left ankle swelling today.

## 2011-04-04 LAB — METABOLIC PANEL, BASIC
Anion gap: 6 mmol/L (ref 6–15)
BUN/Creatinine ratio: 24 (ref 7–25)
BUN: 12 MG/DL (ref 7–18)
CO2: 27 MMOL/L (ref 21–32)
Calcium: 8.2 MG/DL — ABNORMAL LOW (ref 8.5–10.1)
Chloride: 106 MMOL/L (ref 98–107)
Creatinine: 0.5 MG/DL — ABNORMAL LOW (ref 0.6–1.3)
GFR est AA: >60 mL/min/{1.73_m2} (ref >60)
GFR est non-AA: >60 mL/min/{1.73_m2} (ref >60)
Glucose: 68 MG/DL — ABNORMAL LOW (ref 70–110)
Potassium: 4 MMOL/L (ref 3.5–5.3)
Sodium: 139 MMOL/L (ref 136–145)

## 2011-04-04 LAB — CBC WITH AUTOMATED DIFF
ABS. BASOPHILS: 0 10*3/uL (ref 0.0–0.1)
ABS. EOSINOPHILS: 0 10*3/uL (ref 0.0–0.5)
ABS. LYMPHOCYTES: 2.5 10*3/uL (ref 0.8–3.5)
ABS. MONOCYTES: 0.3 10*3/uL — ABNORMAL LOW (ref 0.8–3.5)
ABS. NEUTROPHILS: 3.5 10*3/uL (ref 1.5–8.0)
BASOPHILS: 0 % (ref 0–2)
EOSINOPHILS: 0 % (ref 0–5)
HCT: 40 % — ABNORMAL LOW (ref 41–53)
HGB: 14 g/dL (ref 12.0–16.0)
LYMPHOCYTES: 40 % (ref 19–48)
MCH: 30.1 PG (ref 27–31)
MCHC: 35 g/dL (ref 31–37)
MCV: 86 FL (ref 80–100)
MONOCYTES: 5 % (ref 3–9)
MPV: 9.8 FL (ref 5.9–10.3)
NEUTROPHILS: 55 % (ref 40–74)
PLATELET: 127 10*3/uL — ABNORMAL LOW (ref 130–400)
RBC: 4.65 M/uL (ref 4.2–5.4)
RDW: 14.5 % (ref 11.5–14.5)
WBC: 6.3 10*3/uL (ref 4.5–10.8)

## 2011-04-04 LAB — CK: CK: 72 U/L (ref 26–192)

## 2011-04-04 LAB — TROPONIN I: Troponin-I, Qt.: <0.02 ng/mL (ref 0.00–0.05)

## 2011-04-04 MED ORDER — INDOMETHACIN 25 MG CAP
25 mg | ORAL_CAPSULE | Freq: Three times a day (TID) | ORAL | Status: AC
Start: 2011-04-04 — End: 2011-04-14

## 2011-04-04 MED ORDER — NALBUPHINE 10 MG/ML INJECTION
10 mg/mL | INTRAMUSCULAR | Status: DC
Start: 2011-04-04 — End: 2011-04-04

## 2011-04-04 NOTE — Progress Notes (Signed)
CXR COMPLETE AT 6:28 PM WITH NO COMPLICATIONS.    MELISSA A NEAL

## 2011-04-04 NOTE — ED Notes (Signed)
Resting in bed.  No complaints of chest pain.  Family at bedside

## 2011-04-04 NOTE — ED Notes (Signed)
Resting in bed.  resp even and unlabored.

## 2011-04-04 NOTE — ED Provider Notes (Signed)
HPI Comments: Pt has hx of htm and restarted on lisinopril month ago but bp still running high in am before taking lisinopril. She also takes propanolol for thyroid issue and is scheduled for partial thyroidectomy. Pt reports she was having preop testing at [facility] and bp was 168/107 and she was told she should go to the hospital. Brother reports family hx of heart disease and htn; he is asking for a heart cath.    Patient is a 45 y.o. female presenting with chest pain, shortness of breath, and hypertension. The history is provided by the patient and a relative.   Chest Pain (Angina)   This is a chronic problem. Episode onset: 4 months. The problem has not changed since onset.The problem occurs daily. The pain is mild. The quality of the pain is described as dull. The pain does not radiate. Associated symptoms include headaches and shortness of breath. Pertinent negatives include no abdominal pain, no back pain, no claudication, no cough, no diaphoresis, no dizziness, no exertional chest pressure, no fever, no hemoptysis, no irregular heartbeat, no leg pain, no lower extremity edema, no malaise/fatigue, no nausea, no near-syncope, no numbness, no orthopnea, no palpitations, no PND, no sputum production, no syncope, no vomiting and no weakness. She has tried nothing for the symptoms. Risk factors include family history, hypertension and dyslipidemia.   Shortness of Breath  This is a chronic problem. Episode onset: 4 months. Associated symptoms include headaches and chest pain. Pertinent negatives include no fever, no coryza, no rhinorrhea, no sore throat, no swollen glands, no ear pain, no neck pain, no cough, no sputum production, no hemoptysis, no wheezing, no PND, no orthopnea, no syncope, no vomiting, no abdominal pain, no rash, no leg pain, no leg swelling and no claudication.   Hypertension    This is a chronic problem. Episode onset: 4 months. Associated symptoms include chest pain, anxiety, headaches and shortness of breath. Pertinent negatives include no orthopnea, no palpitations, no PND, no confusion, no malaise/fatigue, no blurred vision, no neck pain, no peripheral edema, no dizziness, no nausea and no vomiting.        Past Medical History   Diagnosis Date   ??? Neuropathy, diabetic    ??? HTN (hypertension)    ??? IBS (irritable bowel syndrome)         Past Surgical History   Procedure Date   ??? Delivery c-section    ??? Hx cholecystectomy    ??? Hx tubal ligation      hysterectomy         No family history on file.     History     Social History   ??? Marital Status: Married     Spouse Name: N/A     Number of Children: N/A   ??? Years of Education: N/A     Occupational History   ??? Not on file.     Social History Main Topics   ??? Smoking status: Current Everyday Smoker -- 1.0 packs/day for 27 years   ??? Smokeless tobacco: Never Used   ??? Alcohol Use: No   ??? Drug Use: 6 per week     Special: Marijuana      vicodin or lortab   ??? Sexually Active: Yes -- Female partner(s)     Birth Control/ Protection: Surgical     Other Topics Concern   ??? Not on file     Social History Narrative   ??? No narrative on file  ALLERGIES: Topamax; Nasal spray; Aspirin; Ciprofloxacin; Levsin; and Pcn      Review of Systems   Constitutional: Negative.  Negative for fever, malaise/fatigue and diaphoresis.   HENT: Negative for ear pain, sore throat, rhinorrhea and neck pain.    Eyes: Negative.  Negative for blurred vision.   Respiratory: Positive for shortness of breath. Negative for cough, hemoptysis, sputum production and wheezing.    Cardiovascular: Positive for chest pain. Negative for palpitations, orthopnea, claudication, leg swelling, syncope, PND and near-syncope.   Gastrointestinal: Negative.  Negative for nausea, vomiting and abdominal pain.   Genitourinary: Negative.     Musculoskeletal: Negative.  Negative for back pain.   Skin: Negative for rash.   Neurological: Positive for headaches. Negative for dizziness, weakness and numbness.   Psychiatric/Behavioral: Negative for confusion.   [all other systems reviewed and are negative        Filed Vitals:    04/04/11 1647   BP: 156/85   Temp: 98.1 ??F (36.7 ??C)   Resp: 18   Height: 5\' 3"  (1.6 m)   Weight: 139 lb (63.05 kg)   SpO2: 100%            Physical Exam   [nursing notereviewed.  Constitutional: She is oriented to person, place, and time. She appears well-developed and well-nourished. No distress.   HENT:   Head: Normocephalic and atraumatic.   Nose: Nose normal.   Eyes: Conjunctivae and EOM are normal. Pupils are equal, round, and reactive to light.   Neck: Normal range of motion. Neck supple.   Cardiovascular: Normal rate, regular rhythm, normal heart sounds and intact distal pulses.    Pulmonary/Chest: Effort normal and breath sounds normal.   Musculoskeletal: Normal range of motion. She exhibits no edema and no tenderness.   Neurological: She is alert and oriented to person, place, and time. No cranial nerve deficit.   Skin: Skin is warm and dry.   Psychiatric: She has a normal mood and affect. Her behavior is normal. Judgment and thought content normal.        MDM     Amount and/or Complexity of Data Reviewed:   Clinical lab tests:  [ordered and reviewed  Tests in the radiology section of CPT??:  [ordered and reviewed  Tests in the medicine section of the CPT??:  [ordered and reviewed  Discussion of test results with the performing providers:  Yes   Obtain history from someone other than the patient:  Yes   Discuss the patient with another provider:  Yes   Independant visualization of image, tracing, or specimen:  Yes      Procedures      EKG Rate 63 sinus rhythm; normal axis; normal QRS  LABS Reviewed  Results for orders placed during the hospital encounter of 04/04/11   METABOLIC PANEL, BASIC       Component Value Range     Sodium 139  136 - 145 (MMOL/L)    Potassium 4.0  3.5 - 5.3 (MMOL/L)    Chloride 106  98 - 107 (MMOL/L)    CO2 27  21 - 32 (MMOL/L)    Anion gap 6  6 - 15 (mmol/L)    Glucose 68 (*) 70 - 110 (MG/DL)    BUN 12  7 - 18 (MG/DL)    Creatinine 0.5 (*) 0.6 - 1.3 (MG/DL)    BUN/Creatinine ratio 24  7 - 25 ( )    GFR est AA >60  >60 (ml/min/1.19m2)    GFR  est non-AA >60  >60 (ml/min/1.56m2)    Calcium 8.2 (*) 8.5 - 10.1 (MG/DL)   CBC WITH AUTOMATED DIFF       Component Value Range    WBC 6.3  4.5 - 10.8 (K/uL)    RBC 4.65  4.2 - 5.4 (M/uL)    HGB 14.0  12.0 - 16.0 (g/dL)    HCT 16.1 (*) 41 - 53 (%)    MCV 86.0  80 - 100 (FL)    MCH 30.1  27 - 31 (PG)    MCHC 35.0  31 - 37 (g/dL)    RDW 09.6  04.5 - 40.9 (%)    PLATELET 127 (*) 130 - 400 (K/uL)    MPV 9.8  5.9 - 10.3 (FL)    NEUTROPHILS 55  40 - 74 (%)    LYMPHOCYTES 40  19 - 48 (%)    MONOCYTES 5  3 - 9 (%)    EOSINOPHILS 0  0 - 5 (%)    BASOPHILS 0  0 - 2 (%)    ABS. NEUTROPHILS 3.5  1.5 - 8.0 (K/UL)    ABS. LYMPHOCYTES 2.5  0.8 - 3.5 (K/UL)    ABS. MONOCYTES 0.3 (*) 0.8 - 3.5 (K/UL)    ABS. EOSINOPHILS 0.0  0.0 - 0.5 (K/UL)    ABS. BASOPHILS 0.0  0.0 - 0.1 (K/UL)    DF AUTOMATED     TROPONIN I       Component Value Range    Troponin-I, Qt. <0.02  0.00 - 0.05 (ng/mL)   CK       Component Value Range    CK 72  26 - 192 (U/L)

## 2011-04-04 NOTE — ED Notes (Signed)
Discharge instructions given to pt.  No questions voiced.  Ambulated to lobby without difficulty.

## 2011-04-04 NOTE — ED Notes (Signed)
Pt presents with c/o chest pain last few hours epigastric in nature.  resp unlabored.  Pt states sob at times.  Skin pink, warm and dry.

## 2011-04-04 NOTE — ED Notes (Signed)
Dull chest pain,shortness of breath,andhigh blood pressure for 4 months.

## 2011-04-04 NOTE — ED Provider Notes (Signed)
I personally saw and examined the patient.  I have reviewed and agree with the MLP's findings, including all diagnostic interpretations, and plans as written.   I was present during the key portions of separately billed procedures.    Niylah Hassan, MD

## 2011-04-05 LAB — EKG, 12 LEAD, INITIAL
Atrial Rate: 63 {beats}/min
Calculated P Axis: 24 degrees
Calculated R Axis: 8 degrees
Calculated T Axis: 21 degrees
Diagnosis: NORMAL
P-R Interval: 154 ms
Q-T Interval: 440 ms
QRS Duration: 76 ms
QTC Calculation (Bezet): 450 ms
Ventricular Rate: 63 {beats}/min

## 2011-06-08 NOTE — ED Notes (Signed)
S/p fall down stairs x 2 hours ago.   Pain and swelling to right wrist and ankle.

## 2011-06-09 MED ORDER — TRAMADOL 50 MG TAB
50 mg | ORAL_TABLET | Freq: Four times a day (QID) | ORAL | Status: DC | PRN
Start: 2011-06-09 — End: 2012-02-04

## 2011-06-09 MED ORDER — KETOROLAC TROMETHAMINE 10 MG TAB
10 mg | ORAL_TABLET | Freq: Three times a day (TID) | ORAL | Status: DC
Start: 2011-06-09 — End: 2012-02-04

## 2011-06-09 MED ORDER — TRAMADOL 50 MG TAB
50 mg | ORAL | Status: DC
Start: 2011-06-09 — End: 2011-06-09

## 2011-06-09 MED ORDER — KETOROLAC TROMETHAMINE 10 MG TAB
10 mg | ORAL | Status: AC
Start: 2011-06-09 — End: 2011-06-09
  Administered 2011-06-09: 07:00:00 via ORAL

## 2011-06-09 MED ORDER — BUPRENORPHINE 0.3 MG/ML INJECTION
0.3 mg/mL | Freq: Once | INTRAMUSCULAR | Status: AC
Start: 2011-06-09 — End: 2011-06-09
  Administered 2011-06-09: 05:00:00 via INTRAMUSCULAR

## 2011-06-09 NOTE — ED Notes (Signed)
Pt was waiting on change of meds. Stated has allergy to ultram that it cause hives and itching, informed MD, and added this med to her allergy list, awaited med order change per MD.

## 2011-06-09 NOTE — ED Notes (Signed)
Pt and family received dc instructions, rx and meds given to go, splint care given, ice packs given, ortho info given, no voiced concerns, verbalized understanding.

## 2011-06-09 NOTE — ED Provider Notes (Addendum)
Patient is a 45 y.o. female presenting with fall. The history is provided by the patient. No language interpreter was used.   Fall  The accident occurred 6 to 12 hours ago. The fall occurred while walking. She fell from a height of ground level. She landed on hard floor. The point of impact was the right wrist. The pain is present in the right wrist. The pain is mild. She was ambulatory at the scene. There was no entrapment after the fall. There was no drug use involved in the accident. There was no alcohol use involved in the accident. Pertinent negatives include no abdominal pain, no nausea, no vomiting, no headaches, no loss of consciousness and no laceration. The risk factors include none.  The symptoms are aggravated by activity, standing and ambulation. She has tried nothing for the symptoms. It is unknown when the patient last had a tetanus shot.        Past Medical History   Diagnosis Date   ??? Neuropathy, diabetic    ??? HTN (hypertension)    ??? IBS (irritable bowel syndrome)         Past Surgical History   Procedure Date   ??? Delivery c-section    ??? Hx cholecystectomy    ??? Hx tubal ligation      hysterectomy   ??? Hx other surgical      thyroidectomy april 2012         No family history on file.     History     Social History   ??? Marital Status: Married     Spouse Name: N/A     Number of Children: N/A   ??? Years of Education: N/A     Occupational History   ??? Not on file.     Social History Main Topics   ??? Smoking status: Current Everyday Smoker -- 1.0 packs/day for 27 years   ??? Smokeless tobacco: Never Used   ??? Alcohol Use: No   ??? Drug Use: 6 per week     Special: Marijuana      vicodin or lortab   ??? Sexually Active: Yes -- Female partner(s)     Birth Control/ Protection: Surgical     Other Topics Concern   ??? Not on file     Social History Narrative   ??? No narrative on file                  ALLERGIES: Topamax; Nasal spray; Aspirin; Ciprofloxacin; Levsin; and Pcn      Review of Systems    Constitutional: Negative.  Negative for chills, activity change and appetite change.   HENT: Negative.  Negative for hearing loss, sore throat, facial swelling, neck pain and neck stiffness.    Eyes: Negative.  Negative for pain, discharge, redness and itching.   Respiratory: Negative.  Negative for cough, chest tightness, shortness of breath and wheezing.    Cardiovascular: Negative.  Negative for chest pain.   Gastrointestinal: Negative.  Negative for nausea, vomiting, abdominal pain, diarrhea and abdominal distention.   Genitourinary: Negative.  Negative for dysuria, urgency, frequency and flank pain.   Musculoskeletal: Negative.  Negative for myalgias, back pain and arthralgias.   Skin: Negative.  Negative for color change, rash and wound.   Neurological: Negative.  Negative for dizziness, seizures, loss of consciousness, weakness, light-headedness and headaches.   Hematological: Negative.  Negative for adenopathy.   Psychiatric/Behavioral: Negative.  Negative for suicidal ideas, hallucinations, confusion, self-injury and agitation.   [  all other systems reviewed and are negative        Filed Vitals:    06/08/11 2330   BP: 113/83   Pulse: 91   Temp: 98.4 ??F (36.9 ??C)   Resp: 18   Height: 5\' 3"  (1.6 m)   Weight: 60.328 kg (133 lb)   SpO2: 100%            Physical Exam   [nursing notereviewed.  Constitutional: She is oriented to person, place, and time. She appears well-developed and well-nourished.   HENT:   Head: Normocephalic and atraumatic.   Eyes: EOM are normal. Pupils are equal, round, and reactive to light.   Neck: Normal range of motion. Neck supple.   Cardiovascular: Normal rate and regular rhythm.    Pulmonary/Chest: Effort normal. No respiratory distress.   Abdominal: She exhibits no distension. There is no guarding.   Musculoskeletal: Normal range of motion. She exhibits edema and tenderness.   Neurological: She is alert and oriented to person, place, and time.    Skin: No laceration and no rash noted. No erythema.   Psychiatric: She has a normal mood and affect. Her behavior is normal.        MDM     Amount and/or Complexity of Data Reviewed:   Tests in the radiology section of CPT??:  [ordered and reviewed  Discussion of test results with the performing providers:  No   Decide to obtain previous medical records or to obtain history from someone other than the patient:  Yes   Obtain history from someone other than the patient:  Yes   Review and summarize past medical records:  Yes   Discuss the patient with another provider:  No   Independant visualization of image, tracing, or specimen:  Yes  Risk of Significant Complications, Morbidity, and/or Mortality:   Presenting problems:  [high  Diagnostic procedures:  [high  Management options:  [high  Progress:   Patient progress:  [stable      Splint, Ankle  Date/Time: 06/09/2011 12:27 AM  Performed by: tech.Supervising provider: Jearldine Cassady  Pre-procedure re-eval: Immediately prior to the procedure, the patient was reevaluated and found suitable for the planned procedure and any planned medications.  Time out: Immediately prior to the procedure a time out was called to verify the correct patient, procedure, equipment, staff and marking as appropriate..  Location details: left ankle  Splint type: sugar tong  Approach: medial and lateral  Supplies used: Ortho-Glass  Post-procedure: The splinted body part was neurovascularly unchanged following the procedure.  Patient tolerance: Patient tolerated the procedure well with no immediate complications.  My total time at bedside, performing this procedure was 16-30 minutes.  Splint, Volar  Date/Time: 06/09/2011 12:30 AM  Performed by: tech.Supervising provider: Chrissa Meetze  Pre-procedure re-eval: Immediately prior to the procedure, the patient was reevaluated and found suitable for the planned procedure and any planned medications.   Time out: Immediately prior to the procedure a time out was called to verify the correct patient, procedure, equipment, staff and marking as appropriate..  Location details: right wrist  Splint type: volar short arm  Approach: medial  Supplies used: Ortho-Glass  Post-procedure: The splinted body part was neurovascularly unchanged following the procedure.  Patient tolerance: Patient tolerated the procedure well with no immediate complications.  My total time at bedside, performing this procedure was 16-30 minutes.

## 2011-06-09 NOTE — ED Notes (Signed)
Pt denies hitting head, there is some deformity noted to right wrist area and swelling noted to right ankle outer ankle area, both areas have 2 +pulses noted (right pedal pulse and right radial pulse). Pt states she was twisting around and missed the steps landing on right side.

## 2011-06-21 NOTE — Progress Notes (Signed)
CT RIGHT WRIST COMPLETED 4:40 PM     ETHAN K HAMMONDS

## 2011-08-28 MED ORDER — HYDROCHLOROTHIAZIDE 25 MG TAB
25 mg | ORAL_TABLET | Freq: Every day | ORAL | Status: DC
Start: 2011-08-28 — End: 2011-08-28

## 2011-08-28 MED ORDER — AMLODIPINE 5 MG TAB
5 mg | ORAL_TABLET | Freq: Every day | ORAL | Status: DC
Start: 2011-08-28 — End: 2011-08-28

## 2011-08-28 MED ORDER — HYDROCHLOROTHIAZIDE 25 MG TAB
25 mg | ORAL_TABLET | Freq: Every day | ORAL | Status: DC
Start: 2011-08-28 — End: 2011-09-10

## 2011-08-28 MED ORDER — AMLODIPINE 5 MG TAB
5 mg | ORAL_TABLET | Freq: Every day | ORAL | Status: DC
Start: 2011-08-28 — End: 2011-12-23

## 2011-08-28 NOTE — Progress Notes (Signed)
Patient: Amber [name]               Sex: female          Enc Date:  08/28/2011        Date of Birth:  05-Aug-1966      Age:  45 y.o.           HPI:     Amber [name] is a 45 y.o. female who Hypertension  Patient is in for Hypertension which is increasing in severity, ran out of blood pressure medication and didn't have it refilled, no PCP.    Diet and Lifestyle: generally follows a low sodium diet, sedentary, smoker 2 ppd, caffeine intake 2 liters of pop every 2 days.  Home BP Monitoring: is not measured at home.  Pertinent ROS: taking medications as instructed, no medication side effects noted, no chest pain on exertion, no dyspnea on exertion, no swelling of ankles, no palpitations, was on lisinopril with water pill. Cardiac risk factors consist of smoking/ tobacco exposure.        Past Medical History   Diagnosis Date   ??? Neuropathy, diabetic    ??? HTN (hypertension)    ??? IBS (irritable bowel syndrome)        Past Surgical History   Procedure Date   ??? Delivery c-section    ??? Hx cholecystectomy    ??? Hx tubal ligation      hysterectomy   ??? Hx other surgical      thyroidectomy april 2012       No family history on file.    History     Social History   ??? Marital Status: Married     Spouse Name: N/A     Number of Children: N/A   ??? Years of Education: N/A     Social History Main Topics   ??? Smoking status: Current Everyday Smoker -- 1.0 packs/day for 27 years   ??? Smokeless tobacco: Never Used   ??? Alcohol Use: No   ??? Drug Use: 6 per week     Special: Marijuana      vicodin or lortab   ??? Sexually Active: Yes -- Female partner(s)     Birth Control/ Protection: Surgical     Other Topics Concern   ??? Not on file     Social History Narrative   ??? No narrative on file       Current Outpatient Prescriptions   Medication Sig Dispense Refill   ??? levothyroxine (SYNTHROID) 125 mcg tablet Take  by mouth Daily (before breakfast).         ??? conjugated estrogens (PREMARIN) 1.25 mg tablet Take 1.25 mg by mouth daily.          ??? hydrOXYzine (ATARAX) 25 mg tablet Take  by mouth two (2) times daily as needed.         ??? omeprazole (PRILOSEC) 20 mg capsule Take 20 mg by mouth daily.       ??? atropine-phenobarbital-scopolamine-hyoscyamine (DONNATAL) 16.2-0.1037 -0.0194 mg per tablet Take 1 Tab by mouth every six (6) hours as needed.       ??? alprazolam (XANAX) 1 mg tablet Take 1 mg by mouth four (4) times daily.       ??? PROMETHAZINE HCL (PHENERGAN PO) Take 25 mg by mouth daily as needed.       ??? traMADol (ULTRAM) 50 mg tablet Take 1 Tab by mouth every six (6) hours as needed for Pain.  12 Tab  0   ??? ketorolac (TORADOL) 10 mg tablet Take 1 Tab by mouth every eight (8) hours.  15 Tab  0   ??? primidone (MYSOLINE) 50 mg tablet Take 50 mg by mouth two (2) times a day.         ??? lisinopril (PRINIVIL, ZESTRIL) 20 mg tablet Take 20 mg by mouth daily.            Allergies   Allergen Reactions   ??? Topamax (Topiramate) Anaphylaxis   ??? Ultram (Tramadol) Itching   ??? Nasal Spray (Sodium Chloride) Other (comments)     Migraine nasal spray makes her throat bleed   ??? Aspirin Other (comments)     Swelling "knot on side of neck"   ??? Ciprofloxacin Other (comments)     Causes facial redness     ??? Levsin (Hyoscyamine Sulfate) Other (comments)     Blurred vision     ??? Pcn (Penicillins) Nausea and Vomiting       Review of Systems  Constitutional: positive for fatigue  Respiratory: positive for dyspnea on exertion or suppose to use CPAP mask, and does not use it  Cardiovascular: negative  Gastrointestinal: positive for diarrhea and IBS  Neurological: negative      Physical Exam:      BP 138/98   Pulse 77   Resp 16   Ht 5\' 3"  (1.6 m)   Wt 138 lb (62.596 kg)   BMI 24.45 kg/m2   SpO2 94%   LMP 07/27/2010     General appearance - alert, well appearing, and in no distress  Neck - supple, no significant adenopathy, carotids upstroke normal bilaterally, no bruits  Chest - clear to auscultation, no wheezes, rales or rhonchi, symmetric air entry   Heart - normal rate, regular rhythm, normal S1, S2, no murmurs, rubs, clicks or gallops  Abdomen - soft, nontender, nondistended, no masses or organomegaly  Extremities - peripheral pulses normal, no pedal edema, no clubbing or cyanosis    Labs Reviewed:  Lab Results   Component Value Date/Time    WBC 6.3 04/04/2011  5:25 PM    HGB 14.0 04/04/2011  5:25 PM    HCT 40.0 04/04/2011  5:25 PM    PLATELET 127 04/04/2011  5:25 PM    MCV 86.0 04/04/2011  5:25 PM     Lab Results   Component Value Date/Time    Sodium 139 04/04/2011  5:25 PM    Potassium 4.0 04/04/2011  5:25 PM    Chloride 106 04/04/2011  5:25 PM    CO2 27 04/04/2011  5:25 PM    Anion gap 6 04/04/2011  5:25 PM    Glucose 68 04/04/2011  5:25 PM    BUN 12 04/04/2011  5:25 PM    Creatinine 0.5 04/04/2011  5:25 PM    BUN/Creatinine ratio 24 04/04/2011  5:25 PM    GFR est non-AA >60 04/04/2011  5:25 PM    Calcium 8.2 04/04/2011  5:25 PM    GFR est AA >60 04/04/2011  5:25 PM     Lab Results   Component Value Date/Time    Sodium 139 04/04/2011  5:25 PM    Potassium 4.0 04/04/2011  5:25 PM    Chloride 106 04/04/2011  5:25 PM    CO2 27 04/04/2011  5:25 PM    Anion gap 6 04/04/2011  5:25 PM    Glucose 68 04/04/2011  5:25 PM    BUN 12 04/04/2011  5:25  PM    Creatinine 0.5 04/04/2011  5:25 PM    BUN/Creatinine ratio 24 04/04/2011  5:25 PM    GFR est AA >60 04/04/2011  5:25 PM    GFR est non-AA >60 04/04/2011  5:25 PM    Calcium 8.2 04/04/2011  5:25 PM    Bilirubin, total 0.5 03/27/2011  5:37 PM    ALT 16 03/27/2011  5:37 PM    AST 10 03/27/2011  5:37 PM    Alk. phosphatase 132 03/27/2011  5:37 PM    Protein, total 7.8 03/27/2011  5:37 PM    Albumin 4.2 03/27/2011  5:37 PM    Globulin 3.6 03/27/2011  5:37 PM    A-G Ratio 1.2 03/27/2011  5:37 PM           Imaging and Procedure:      Assessment/Plan     There is no problem list on file for this patient.       Encounter Diagnoses   Name Primary?   ??? Essential hypertension, benign Yes   ??? Palpitations      Orders Placed This Encounter    ??? levothyroxine (SYNTHROID) 125 mcg tablet   ??? DISCONTD: amLODIPine (NORVASC) 5 mg tablet   ??? DISCONTD: hydrochlorothiazide (HYDRODIURIL) 25 mg tablet     Myndi was seen today for hypertension.    Diagnoses and associated orders for this visit:    Essential hypertension, benign    Palpitations    Other Orders  - levothyroxine (SYNTHROID) 125 mcg tablet; Take  by mouth Daily (before breakfast).    - Discontinue: amLODIPine (NORVASC) 5 mg tablet; Take 1 Tab by mouth daily.  - Discontinue: hydrochlorothiazide (HYDRODIURIL) 25 mg tablet; Take 1 Tab by mouth daily.        Follow-up Disposition:  Return in about 4 months (around 12/28/2011).    Orders Placed This Encounter   ??? levothyroxine (SYNTHROID) 125 mcg tablet     Sig: Take  by mouth Daily (before breakfast).              LOW salt  Add Norvasc 5 mg daily  Quit Sodas

## 2011-08-28 NOTE — Patient Instructions (Signed)
MyChart Activation    Thank you for requesting access to MyChart. Please follow the instructions below to securely access and download your online medical record. MyChart allows you to send messages to your doctor, view your test results, renew your prescriptions, schedule appointments, and more.    How Do I Sign Up?    1. In your internet browser, go to [email or web address]  2. Click on the First Time User? Click Here link in the Sign In box. You will be redirect to the New Member Sign Up page.  3. Enter your MyChart Access Code exactly as it appears below. You will not need to use this code after you???ve completed the sign-up process. If you do not sign up before the expiration date, you must request a new code.    MyChart Access Code: [email or web address]  Expires: 11/26/2011  2:22 PM (This is the date your MyChart access code will expire)    4. Enter the last four digits of your Social Security Number (xxxx) and Date of Birth (mm/dd/yyyy) as indicated and click Submit. You will be taken to the next sign-up page.  5. Create a MyChart ID. This will be your MyChart login ID and cannot be changed, so think of one that is secure and easy to remember.  6. Create a MyChart password. You can change your password at any time.  7. Enter your Password Reset Question and Answer. This can be used at a later time if you forget your password.   8. Enter your e-mail address. You will receive e-mail notification when new information is available in MyChart.  9. Click Sign Up. You can now view and download portions of your medical record.  10. Click the Download Summary menu link to download a portable copy of your medical information.    Additional Information    If you have questions, please visit the Frequently Asked Questions section of the MyChart website at [email or web address] Remember, MyChart is NOT to be used for urgent needs. For medical emergencies, dial 911.

## 2011-09-02 NOTE — Telephone Encounter (Signed)
Advise patient to stop the medication and see if this relieves her symptoms.  If it does, let me know and I will see if we can order another medication for her blood pressure.

## 2011-09-02 NOTE — Telephone Encounter (Signed)
Pt notified.  Verb und.

## 2011-09-02 NOTE — Telephone Encounter (Signed)
Pt called to let you know she might be having a medication reaction to the Norvasc that you put her on recently.  She said that since she started taking the Norvasc she has started breaking out in blotchy patches on her face.  Pt states she didn't have this problem until she started the norvasc.  Pt wanting to know if you want her to continue taking the medication.

## 2011-09-09 NOTE — Telephone Encounter (Signed)
I talked with pt's spouse and he will have her call us back.

## 2011-09-09 NOTE — Telephone Encounter (Signed)
Do not restart norvasc, as this is possible allergic reaction.  Can double her lisinopril and watch her blood pressure.

## 2011-09-09 NOTE — Telephone Encounter (Signed)
Pt called last week saying that she thought Norvasc, which was a new med for her, was causing a rash.  We told her to stop it for a few days and call us back to see if rash improved.  She called back today and rash is almost completely gone.  Wants to know about restarting Norvasc.  She also wanted [place] to know that she cut her hydrochlorothiazide in half because she felt she was going to the BR too much.

## 2011-09-10 MED ORDER — LOSARTAN 50 MG TAB
50 mg | ORAL_TABLET | Freq: Every day | ORAL | Status: DC
Start: 2011-09-10 — End: 2011-09-23

## 2011-09-10 MED ORDER — HYDROCHLOROTHIAZIDE 25 MG TAB
25 mg | ORAL_TABLET | Freq: Every day | ORAL | Status: DC
Start: 2011-09-10 — End: 2011-12-23

## 2011-09-10 NOTE — Telephone Encounter (Signed)
Pt notified.

## 2011-09-10 NOTE — Telephone Encounter (Signed)
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

## 2011-09-10 NOTE — Telephone Encounter (Signed)
She is not compliant with medications, never told us she was not taking lisinopril. Since she alleges that it did not help her,  I will send cozaar to her pharmacy ([facility])  and see how this does for her.

## 2011-09-10 NOTE — Telephone Encounter (Signed)
Pt was returning your call from yesterday.  Pt said she had stopped taking the Norvasc last Monday and whelps went away on face but pt is having bad headache and bp was 129/89 and pulse 89.  Please call pt back with instructions on what to do.

## 2011-09-10 NOTE — Telephone Encounter (Signed)
Amber [name], we had stopped the Norvasc and was going to double her lisinopril.  Do you still want to double the lisinopril?  And what to tell her about her H/A?

## 2011-09-10 NOTE — Telephone Encounter (Signed)
Check notes,  I advised you to have her double lisinopril. Did she do that? Her blood pressure and pulse are not that bad, advise to see PCP re headache.

## 2011-09-10 NOTE — Telephone Encounter (Signed)
I called pt to discuss.  She states she has not taken the lisinopril for awhile now.  She ran out several weeks ago and didn't restart it.  She feels like it did not control her BP.  Her most recent readings on BP are 121/93, HR 89 today and 125/90, HR 81 last PM.

## 2011-09-23 MED ORDER — LOSARTAN 50 MG TAB
50 mg | ORAL_TABLET | Freq: Every day | ORAL | Status: DC
Start: 2011-09-23 — End: 2011-12-23

## 2011-09-23 NOTE — Telephone Encounter (Signed)
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

## 2011-10-24 LAB — DRUG SCREEN, URINE
AMPHETAMINES: NEGATIVE
BARBITURATES: POSITIVE
BENZODIAZEPINES: POSITIVE
COCAINE: NEGATIVE
METHADONE: NEGATIVE
OPIATES: NEGATIVE
PCP(PHENCYCLIDINE): NEGATIVE
THC (TH-CANNABINOL): POSITIVE
TRICYCLICS: NEGATIVE

## 2011-10-25 LAB — FAX TO: FAX TO NUMBER: 6063269809

## 2011-12-08 LAB — CBC WITH AUTOMATED DIFF
ABS. BASOPHILS: 0 10*3/uL (ref 0.0–0.1)
ABS. EOSINOPHILS: 0 10*3/uL (ref 0.0–0.5)
ABS. LYMPHOCYTES: 2.6 10*3/uL (ref 0.8–3.5)
ABS. MONOCYTES: 0.5 10*3/uL — ABNORMAL LOW (ref 0.8–3.5)
ABS. NEUTROPHILS: 6.4 10*3/uL (ref 1.5–8.0)
BASOPHILS: 0 % (ref 0–2)
EOSINOPHILS: 0 % (ref 0–5)
HCT: 40.1 % — ABNORMAL LOW (ref 41–53)
HGB: 14.2 g/dL (ref 12.0–16.0)
LYMPHOCYTES: 27 % (ref 19–48)
MCH: 32.3 PG — ABNORMAL HIGH (ref 27–31)
MCHC: 35.4 g/dL (ref 31–37)
MCV: 91.3 FL (ref 80–100)
MONOCYTES: 5 % (ref 3–9)
MPV: 8.9 FL (ref 5.9–10.3)
NEUTROPHILS: 68 % (ref 40–74)
PLATELET: 190 10*3/uL (ref 130–400)
RBC: 4.39 M/uL (ref 4.2–5.4)
RDW: 12.9 % (ref 11.5–14.5)
WBC: 9.5 10*3/uL (ref 4.5–10.8)

## 2011-12-08 LAB — URINALYSIS W/ RFLX MICROSCOPIC
Bilirubin: NEGATIVE
Blood: NEGATIVE
Glucose: NEGATIVE MG/DL
Ketone: NEGATIVE MG/DL
Leukocyte Esterase: NEGATIVE
Nitrites: NEGATIVE
Protein: NEGATIVE MG/DL
Specific gravity: <1.005 (ref 1.002–1.030)
Urobilinogen: 0.2 EU/DL (ref 0–1)
pH (UA): 6 (ref 4.5–8.0)

## 2011-12-08 LAB — DRUG SCREEN, URINE
AMPHETAMINES: NEGATIVE
BARBITURATES: POSITIVE
BENZODIAZEPINES: POSITIVE
COCAINE: NEGATIVE
METHADONE: NEGATIVE
OPIATES: NEGATIVE
PCP(PHENCYCLIDINE): NEGATIVE
THC (TH-CANNABINOL): NEGATIVE
TRICYCLICS: NEGATIVE

## 2011-12-08 LAB — PHENOBARBITAL LEVEL: Phenobarbital: 6.1 ug/mL — ABNORMAL LOW (ref 15–40)

## 2011-12-08 LAB — HCG QL SERUM: HCG, Ql.: NEGATIVE

## 2011-12-08 LAB — METABOLIC PANEL, BASIC
Anion gap: 12 mmol/L (ref 6–15)
BUN/Creatinine ratio: 12 (ref 7–25)
BUN: 7 MG/DL (ref 7–18)
CO2: 22 MMOL/L (ref 21–32)
Calcium: 8.4 MG/DL — ABNORMAL LOW (ref 8.5–10.1)
Chloride: 111 MMOL/L — ABNORMAL HIGH (ref 98–107)
Creatinine: 0.6 MG/DL (ref 0.60–1.30)
GFR est AA: >60 mL/min/{1.73_m2} (ref >60)
GFR est non-AA: >60 mL/min/{1.73_m2} (ref >60)
Glucose: 100 MG/DL (ref 70–110)
Potassium: 3.9 MMOL/L (ref 3.5–5.3)
Sodium: 145 MMOL/L (ref 136–145)

## 2011-12-08 LAB — ETHYL ALCOHOL: ALCOHOL(ETHYL),SERUM: 259 MG/DL

## 2011-12-08 MED ORDER — ACTIVATED CHARCOAL 50 GRAM/240 ML ORAL SUSP
50 gram/240 mL | ORAL | Status: DC
Start: 2011-12-08 — End: 2011-12-08

## 2011-12-08 NOTE — ED Provider Notes (Signed)
Patient is a 45 y.o. female presenting with intoxication. The history is provided by the patient, the EMS personnel and the police. The history is limited by the condition of the patient.   Alcohol intoxication  Primary symptoms include: intoxication.  Primary symptoms comment: EMS says called to pt due to pt publically intoxicated and takig xanax, pt has no medical complaints This is a new problem. Episode onset: today. The problem has not changed since onset.Suspected agents include alcohol and prescription drugs. Pertinent negatives include no fever and no vomiting.        Past Medical History   Diagnosis Date   ??? Neuropathy, diabetic    ??? HTN (hypertension)    ??? IBS (irritable bowel syndrome)         Past Surgical History   Procedure Date   ??? Delivery c-section    ??? Hx cholecystectomy    ??? Hx tubal ligation      hysterectomy   ??? Hx other surgical      thyroidectomy april 2012         No family history on file.     History     Social History   ??? Marital Status: Married     Spouse Name: N/A     Number of Children: N/A   ??? Years of Education: N/A     Occupational History   ??? Not on file.     Social History Main Topics   ??? Smoking status: Current Everyday Smoker -- 1.0 packs/day for 27 years   ??? Smokeless tobacco: Never Used   ??? Alcohol Use: No   ??? Drug Use: 6 per week     Special: Marijuana      vicodin or lortab   ??? Sexually Active: Yes -- Female partner(s)     Birth Control/ Protection: Surgical     Other Topics Concern   ??? Not on file     Social History Narrative   ??? No narrative on file                  ALLERGIES: Topamax; Ultram; Nasal spray; Aspirin; Ciprofloxacin; Levsin; and Pcn      Review of Systems   Unable to perform ROS  Constitutional: Negative for fever.   Gastrointestinal: Negative for vomiting.       Filed Vitals:    12/08/11 0153   BP: 119/69   Pulse: 119   Temp: 97.4 ??F (36.3 ??C)   Resp: 32   SpO2: 99%            Physical Exam   Nursing note and vitals reviewed.  Constitutional:         Completely uncooperative and cursing at the police and will not answer my questions   HENT:   Head: Normocephalic and atraumatic.   Eyes: Pupils are equal, round, and reactive to light.   Cardiovascular: Normal rate and regular rhythm.    No murmur heard.  Pulmonary/Chest: Effort normal and breath sounds normal. No stridor.   Abdominal: Soft. She exhibits no distension. There is no tenderness. There is no rebound and no guarding.   Musculoskeletal: She exhibits no edema.   Neurological: She is alert.   Skin: No rash noted. She is not diaphoretic.   Psychiatric:        Angry, uncooperative        MDM    Procedures

## 2011-12-08 NOTE — ED Notes (Signed)
[place] police at bedside

## 2011-12-08 NOTE — ED Notes (Signed)
Pt was brought to ER per [facility]. Pt report was intoxication and combative.

## 2011-12-08 NOTE — ED Notes (Signed)
Pt refused charcoal.

## 2011-12-08 NOTE — ED Notes (Signed)
Restraints removed. Pt was in police custody.

## 2011-12-08 NOTE — ED Notes (Signed)
I have reviewed discharge instructions with the patient.  The patient verbalized understanding.

## 2011-12-08 NOTE — ED Notes (Signed)
Pt was brought in by [facility]. Pt is combative and has been drinking. Pt refuses to answer any question.

## 2011-12-20 LAB — DRUG SCREEN, URINE
AMPHETAMINES: NEGATIVE
BARBITURATES: POSITIVE
BENZODIAZEPINES: POSITIVE
COCAINE: NEGATIVE
METHADONE: NEGATIVE
OPIATES: NEGATIVE
PCP(PHENCYCLIDINE): NEGATIVE
THC (TH-CANNABINOL): NEGATIVE
TRICYCLICS: NEGATIVE

## 2011-12-23 LAB — T4, FREE: T4, Free: 1.1 NG/DL (ref 0.76–1.46)

## 2011-12-23 LAB — FAX TO: FAX TO NUMBER: 3269809

## 2011-12-23 LAB — TSH 3RD GENERATION: TSH: 3.17 u[IU]/mL (ref 0.35–3.74)

## 2011-12-23 MED ORDER — LOSARTAN-HYDROCHLOROTHIAZIDE 100 MG-12.5 MG TAB
ORAL_TABLET | Freq: Every day | ORAL | Status: DC
Start: 2011-12-23 — End: 2014-03-24

## 2011-12-23 NOTE — Patient Instructions (Addendum)
MyChart Activation    Thank you for requesting access to MyChart. Please follow the instructions below to securely access and download your online medical record. MyChart allows you to send messages to your doctor, view your test results, renew your prescriptions, schedule appointments, and more.    How Do I Sign Up?    1. In your internet browser, go to [email or web address]  2. Click on the First Time User? Click Here link in the Sign In box. You will be redirect to the New Member Sign Up page.  3. Enter your MyChart Access Code exactly as it appears below. You will not need to use this code after you???ve completed the sign-up process. If you do not sign up before the expiration date, you must request a new code.    MyChart Access Code: [email or web address]  Expires: 03/22/2012  1:52 PM (This is the date your MyChart access code will expire)    4. Enter the last four digits of your Social Security Number (xxxx) and Date of Birth (mm/dd/yyyy) as indicated and click Submit. You will be taken to the next sign-up page.  5. Create a MyChart ID. This will be your MyChart login ID and cannot be changed, so think of one that is secure and easy to remember.  6. Create a MyChart password. You can change your password at any time.  7. Enter your Password Reset Question and Answer. This can be used at a later time if you forget your password.   8. Enter your e-mail address. You will receive e-mail notification when new information is available in MyChart.  9. Click Sign Up. You can now view and download portions of your medical record.  10. Click the Download Summary menu link to download a portable copy of your medical information.    Additional Information    If you have questions, please visit the Frequently Asked Questions section of the MyChart website at [email or web address] Remember, MyChart is NOT to be used for urgent needs. For medical emergencies, dial 911.      Call Lona Millard PA at [facility] for appointment  Stop cozaar and hydrochlorathiazide  Start Hyzaar 100/12.5 mg daily for high blood pressure  Follow up in 1 year.

## 2011-12-23 NOTE — Progress Notes (Signed)
Patient: Amber [name]               Sex: female          Enc Date:  12/23/2011        Date of Birth:  21-Oct-1966      Age:  46 y.o.           HPI:     Amber [name] is a 46 y.o. female who presents for high blood pressure, it has been up, brings in lists that indicate 151/94, 147/97, 134/89, 143.87 150/97 143/98, 145/96.  Still smokes, rare ETOH use, lightly salts food, drinks lots of sodas, mainly sprite.  Denies chest pain, has SOB, has muscle pains.  No edema, occasional palpitations.  Patient states she has stopped smoking marijuana. Does not take hctz every day, she runs to the bath room too much.    Past Medical History   Diagnosis Date   ??? Neuropathy, diabetic    ??? HTN (hypertension)    ??? IBS (irritable bowel syndrome)    ??? OSA (obstructive sleep apnea)        Past Surgical History   Procedure Date   ??? Delivery c-section    ??? Hx cholecystectomy    ??? Hx tubal ligation      hysterectomy   ??? Hx other surgical      thyroidectomy april 2012       No family history on file.    History     Social History   ??? Marital Status: Married     Spouse Name: N/A     Number of Children: N/A   ??? Years of Education: N/A     Social History Main Topics   ??? Smoking status: Current Everyday Smoker -- 1.0 packs/day for 27 years   ??? Smokeless tobacco: Never Used   ??? Alcohol Use: No   ??? Drug Use: 6 per week     Special: Marijuana      vicodin or lortab   ??? Sexually Active: Yes -- Female partner(s)     Birth Control/ Protection: Surgical     Other Topics Concern   ??? Not on file     Social History Narrative   ??? No narrative on file       Current Outpatient Prescriptions   Medication Sig Dispense Refill   ??? hydrOXYzine (VISTARIL) 25 mg capsule Take 25 mg by mouth three (3) times daily as needed.         ??? losartan-hydrochlorothiazide (HYZAAR) 100-12.5 mg per tablet Take 1 Tab by mouth daily.  30 Tab  5   ??? levothyroxine (SYNTHROID) 125 mcg tablet Take  by mouth Daily (before breakfast).         ??? primidone (MYSOLINE) 50 mg tablet Take 50  mg by mouth two (2) times a day.         ??? conjugated estrogens (PREMARIN) 1.25 mg tablet Take 1.25 mg by mouth daily.         ??? omeprazole (PRILOSEC) 20 mg capsule Take 20 mg by mouth daily.       ??? atropine-phenobarbital-scopolamine-hyoscyamine (DONNATAL) 16.2-0.1037 -0.0194 mg per tablet Take 1 Tab by mouth every six (6) hours as needed.       ??? alprazolam (XANAX) 1 mg tablet Take 1 mg by mouth four (4) times daily.       ??? PROMETHAZINE HCL (PHENERGAN PO) Take 25 mg by mouth daily as needed.       ???  traMADol (ULTRAM) 50 mg tablet Take 1 Tab by mouth every six (6) hours as needed for Pain.  12 Tab  0   ??? ketorolac (TORADOL) 10 mg tablet Take 1 Tab by mouth every eight (8) hours.  15 Tab  0   ??? hydrOXYzine (ATARAX) 25 mg tablet Take  by mouth two (2) times daily as needed.       ??? lisinopril (PRINIVIL, ZESTRIL) 20 mg tablet Take 20 mg by mouth daily.            Allergies   Allergen Reactions   ??? Topamax (Topiramate) Anaphylaxis   ??? Ultram (Tramadol) Itching   ??? Nasal Spray (Sodium Chloride) Other (comments)     Migraine nasal spray makes her throat bleed   ??? Aspirin Other (comments)     Swelling "knot on side of neck"   ??? Ciprofloxacin Other (comments)     Causes facial redness     ??? Norvasc (Amlodipine) Hives   ??? Levsin (Hyoscyamine Sulfate) Other (comments)     Blurred vision     ??? Pcn (Penicillins) Nausea and Vomiting       Review of Systems  Constitutional: positive for fatigue and malaise  Respiratory: positive for dyspnea on exertion  Cardiovascular: negative  Gastrointestinal: negative  Neurological: positive for insomnia      Physical Exam:      BP 140/80   Pulse 67   Resp 16   Ht 5\' 6"  (1.676 m)   Wt 140 lb (63.504 kg)   BMI 22.60 kg/m2   SpO2 100%   LMP 07/27/2010     General appearance - alert, well appearing, and in no distress, oriented to person, place, and time and normal appearing weight  Neck - supple, no significant adenopathy, carotids upstroke normal bilaterally, no bruits, thyroid exam: thyroid  is normal in size without nodules or tenderness  Chest - clear to auscultation, no wheezes, rales or rhonchi, symmetric air entry  Heart - normal rate, regular rhythm, normal S1, S2, no murmurs, rubs, clicks or gallops  Abdomen - soft, nontender, nondistended, no masses or organomegaly  Extremities - peripheral pulses normal, no pedal edema, no clubbing or cyanosis  Skin - normal coloration and turgor, no rashes, no suspicious skin lesions noted    Labs Reviewed:  Lab Results   Component Value Date/Time    WBC 9.5 12/08/2011  2:20 AM    HGB 14.2 12/08/2011  2:20 AM    HCT 40.1 12/08/2011  2:20 AM    PLATELET 190 12/08/2011  2:20 AM    MCV 91.3 12/08/2011  2:20 AM     Lab Results   Component Value Date/Time    Sodium 145 12/08/2011  2:20 AM    Potassium 3.9 12/08/2011  2:20 AM    Chloride 111 12/08/2011  2:20 AM    CO2 22 12/08/2011  2:20 AM    Anion gap 12 12/08/2011  2:20 AM    Glucose 100 12/08/2011  2:20 AM    BUN 7 12/08/2011  2:20 AM    Creatinine 0.60 12/08/2011  2:20 AM    BUN/Creatinine ratio 12 12/08/2011  2:20 AM    GFR est non-AA >60 12/08/2011  2:20 AM    Calcium 8.4 12/08/2011  2:20 AM    GFR est AA >60 12/08/2011  2:20 AM     Lab Results   Component Value Date/Time    Sodium 145 12/08/2011  2:20 AM    Potassium 3.9 12/08/2011  2:20 AM    Chloride 111 12/08/2011  2:20 AM    CO2 22 12/08/2011  2:20 AM    Anion gap 12 12/08/2011  2:20 AM    Glucose 100 12/08/2011  2:20 AM    BUN 7 12/08/2011  2:20 AM    Creatinine 0.60 12/08/2011  2:20 AM    BUN/Creatinine ratio 12 12/08/2011  2:20 AM    GFR est AA >60 12/08/2011  2:20 AM    GFR est non-AA >60 12/08/2011  2:20 AM    Calcium 8.4 12/08/2011  2:20 AM    Bilirubin, total 0.5 03/27/2011  5:37 PM    ALT 16 03/27/2011  5:37 PM    AST 10 03/27/2011  5:37 PM    Alk. phosphatase 132 03/27/2011  5:37 PM    Protein, total 7.8 03/27/2011  5:37 PM    Albumin 4.2 03/27/2011  5:37 PM    Globulin 3.6 03/27/2011  5:37 PM    A-G Ratio 1.2 03/27/2011  5:37 PM                  Assessment/Plan     There is no problem list on file for this patient.       1. HTN (hypertension)  hydrOXYzine (VISTARIL) 25 mg capsule, AMB POC EKG ROUTINE W/ 12 LEADS, INTER & REP, TSH, 3RD GENERATION, T4, FREE   2. Hypothyroid  TSH, 3RD GENERATION, T4, FREE     Orders Placed This Encounter   ??? TSH, 3RD GENERATION   ??? T4, FREE   ??? AMB POC EKG ROUTINE W/ 12 LEADS, INTER & REP   ??? hydrOXYzine (VISTARIL) 25 mg capsule   ??? losartan-hydrochlorothiazide (HYZAAR) 100-12.5 mg per tablet     Jonnette was seen today for hypertension.    Diagnoses and associated orders for this visit:    Htn (hypertension)  - hydrOXYzine (VISTARIL) 25 mg capsule; Take 25 mg by mouth three (3) times daily as needed.    - AMB POC EKG ROUTINE W/ 12 LEADS, INTER & REP  - TSH, 3RD GENERATION; Future  - T4, FREE; Future    Hypothyroid  - TSH, 3RD GENERATION; Future  - T4, FREE; Future    Other Orders  - losartan-hydrochlorothiazide (HYZAAR) 100-12.5 mg per tablet; Take 1 Tab by mouth daily.        Follow-up Disposition:  Return in about 1 year (around 12/22/2012).  the following changes in treatment are made: start hyzaar 100/12.5  reviewed diet, exercise and weight control  very strongly urged to quit smoking to reduce cardiovascular risk  Asked her to see PCP - referred to Lona Millard PA. Voiced understanding.    Orders Placed This Encounter   ??? TSH, 3RD GENERATION     Standing Status: Future      Number of Occurrences:       Standing Expiration Date: 06/22/2012   ??? T4, FREE     Standing Status: Future      Number of Occurrences:       Standing Expiration Date: 06/22/2012   ??? AMB POC EKG ROUTINE W/ 12 LEADS, INTER & REP     Order Specific Question:  Reason for Exam:     Answer:  htn   ??? hydrOXYzine (VISTARIL) 25 mg capsule     Sig: Take 25 mg by mouth three (3) times daily as needed.     ??? losartan-hydrochlorothiazide (HYZAAR) 100-12.5 mg per tablet     Sig: Take 1 Tab by  mouth daily.     Dispense:  30 Tab     Refill:  5

## 2012-01-13 ENCOUNTER — Encounter

## 2012-01-22 ENCOUNTER — Encounter

## 2012-02-04 NOTE — Other (Signed)
Armband checked, Name, DOB, and procedure verified with patient.

## 2012-02-05 ENCOUNTER — Inpatient Hospital Stay: Payer: BLUE CROSS/BLUE SHIELD

## 2012-02-05 LAB — EKG, 12 LEAD, INITIAL
Atrial Rate: 65 {beats}/min
Calculated P Axis: 53 degrees
Calculated R Axis: 31 degrees
Calculated T Axis: 23 degrees
Diagnosis: NORMAL
P-R Interval: 162 ms
Q-T Interval: 400 ms
QRS Duration: 78 ms
QTC Calculation (Bezet): 416 ms
Ventricular Rate: 65 {beats}/min

## 2012-02-05 LAB — ELECTROLYTES
Anion gap: 9 mmol/L (ref 6–15)
CO2: 27 MMOL/L (ref 21–32)
Chloride: 101 MMOL/L (ref 98–107)
Potassium: 3.9 MMOL/L (ref 3.5–5.3)
Sodium: 137 MMOL/L (ref 136–145)

## 2012-02-05 LAB — HEMOGLOBIN: HGB: 13.4 g/dL (ref 12.0–16.0)

## 2012-02-05 LAB — CREATININE: Creatinine: 0.5 MG/DL — ABNORMAL LOW (ref 0.60–1.30)

## 2012-02-05 MED ORDER — FENTANYL CITRATE (PF) 50 MCG/ML IJ SOLN
50 mcg/mL | INTRAMUSCULAR | Status: DC | PRN
Start: 2012-02-05 — End: 2012-02-05

## 2012-02-05 MED ORDER — PROPOFOL 10 MG/ML IV EMUL
10 mg/mL | INTRAVENOUS | Status: DC | PRN
Start: 2012-02-05 — End: 2012-02-05

## 2012-02-05 MED ORDER — ONDANSETRON (PF) 4 MG/2 ML INJECTION
4 mg/2 mL | Freq: Once | INTRAMUSCULAR | Status: AC
Start: 2012-02-05 — End: 2012-02-05
  Administered 2012-02-05: 14:00:00 via INTRAVENOUS

## 2012-02-05 MED ORDER — FLUMAZENIL 0.1 MG/ML IV SOLN
0.1 mg/mL | INTRAVENOUS | Status: DC | PRN
Start: 2012-02-05 — End: 2012-02-05

## 2012-02-05 MED ORDER — MIDAZOLAM 1 MG/ML IJ SOLN
1 mg/mL | Freq: Once | INTRAMUSCULAR | Status: AC
Start: 2012-02-05 — End: 2012-02-05
  Administered 2012-02-05: 14:00:00 via INTRAVENOUS

## 2012-02-05 MED ORDER — WATER FOR IRRIGATION, STERILE SOLUTION
Status: AC
Start: 2012-02-05 — End: 2012-02-05
  Administered 2012-02-05: 14:00:00

## 2012-02-05 MED ORDER — LACTATED RINGERS IV
INTRAVENOUS | Status: DC
Start: 2012-02-05 — End: 2012-02-05
  Administered 2012-02-05: 14:00:00 via INTRAVENOUS

## 2012-02-05 MED ORDER — NALOXONE 0.4 MG/ML INJECTION
0.4 mg/mL | INTRAMUSCULAR | Status: DC | PRN
Start: 2012-02-05 — End: 2012-02-05

## 2012-02-05 MED FILL — MIDAZOLAM 1 MG/ML IJ SOLN: 1 mg/mL | INTRAMUSCULAR | Qty: 2

## 2012-02-05 MED FILL — ONDANSETRON (PF) 4 MG/2 ML INJECTION: 4 mg/2 mL | INTRAMUSCULAR | Qty: 2

## 2012-02-05 MED FILL — WATER FOR IRRIGATION, STERILE SOLUTION: Qty: 1000

## 2012-02-05 NOTE — Procedures (Signed)
EGD and biopsy   Esophageal spasm  A 5 mm nodule in the mid esophagus, biopsied  Antral gastritis. Biopsy for H pylori taken  Normal duodenum  Dictated #[id number]

## 2012-02-05 NOTE — Op Note (Signed)
[facility]      PT Name:  Amber [name], [place]            Admitted:  02/05/2012  MR#:  [id number]                     DOB:  1966-04-15  Account #:  [id number]            Age:  46  Dictator:  Joretta Bachelor, M.D.   Location:      OPERATIVE REPORT    SURGERY DATE: 02/05/2012    PREOPERATIVE DIAGNOSIS:  Dysphagia    POSTOPERATIVE DIAGNOSIS:  1. Esophageal spasm  2. A nodule in the mid esophagus, biopsied and removed  3. Antral gastritis, biopsies  4. Normal duodenum    OPERATION:  Esophagogastroduodenoscopy and biopsy from the mid esophagus and  biopsy from the gastric antrum    ANESTHESIA:  MAC  ENDOSCOPIST:  Dr. Lyndee Leo    PERTINENT HISTORY and INDICATIONS FOR PROCEDURE:  This 46 year old White female has been complaining intermittent  dysphagia.  Esophageal spasm versus stricture versus neoplasm was  considered.  EGD was recommended.    PROCEDURE and FINDINGS:  The patient was kept in the left lateral position.  She was sedated  by the CRNA.    Olympus video endoscope was advanced through the pharynx into the  esophagus, stomach, duodenal bulb and second portion of the  duodenum.    The esophagus showed prominent contractions consistent with  esophageal dysmotility and spasm.  In the mid esophagus there was a  approximately 5 mm sized nodule.  This could be a area of thickening  on the mucosal lining.  This was biopsied and removed.  The  gastroesophageal junction was normal.  There was no appreciable  hiatus hernia.  The gastric mucosa showed antral gastritis.  Biopsies were taken for H-pylori.  Pylorus, duodenal bulb and second  portion of the duodenum appeared normal.  A retroflexed view of the  fundus was also normal.  The scope was withdrawn and the procedure  was terminated.    The endoscopic findings were discussed with patient and her family.  Appropriate postoperative instructions and follow-up appointment to  the office was given.        _____________________________  Markus Daft, M.D.    RKW:bsb DD: 02/05/2012 08:47:11  DT: 02/05/2012 09:10:07 Job ID:  [id number]  CC:          Document #:  [id number]

## 2012-02-05 NOTE — Op Note (Signed)
[facility]      PT Name:  [name], Amber [name]            Admitted:  02/05/2012  MR#:  [id number]                     DOB:  04/09/1966  Account #:  [id number]            Age:  45  Dictator:  Pieper Kasik, M.D.   Location:      OPERATIVE REPORT    SURGERY DATE: 02/05/2012    PREOPERATIVE DIAGNOSIS:  Dysphagia    POSTOPERATIVE DIAGNOSIS:  1. Esophageal spasm  2. A nodule in the mid esophagus, biopsied and removed  3. Antral gastritis, biopsies  4. Normal duodenum    OPERATION:  Esophagogastroduodenoscopy and biopsy from the mid esophagus and  biopsy from the gastric antrum    ANESTHESIA:  MAC  ENDOSCOPIST:  Dr. Girard Koontz    PERTINENT HISTORY and INDICATIONS FOR PROCEDURE:  This 45 year old White female has been complaining intermittent  dysphagia.  Esophageal spasm versus stricture versus neoplasm was  considered.  EGD was recommended.    PROCEDURE and FINDINGS:  The patient was kept in the left lateral position.  She was sedated  by the CRNA.    Olympus video endoscope was advanced through the pharynx into the  esophagus, stomach, duodenal bulb and second portion of the  duodenum.    The esophagus showed prominent contractions consistent with  esophageal dysmotility and spasm.  In the mid esophagus there was a  approximately 5 mm sized nodule.  This could be a area of thickening  on the mucosal lining.  This was biopsied and removed.  The  gastroesophageal junction was normal.  There was no appreciable  hiatus hernia.  The gastric mucosa showed antral gastritis.  Biopsies were taken for H-pylori.  Pylorus, duodenal bulb and second  portion of the duodenum appeared normal.  A retroflexed view of the  fundus was also normal.  The scope was withdrawn and the procedure  was terminated.    The endoscopic findings were discussed with patient and her family.  Appropriate postoperative instructions and follow-up appointment to  the office was given.        _____________________________  R.  K. Jaycen Vercher, M.D.    RKW:bsb DD: 02/05/2012 08:47:11  DT: 02/05/2012 09:10:07 Job ID:  [id number]  CC:          Document #:  [id number]

## 2012-02-05 NOTE — Procedures (Signed)
EGD and biopsy   Esophageal spasm  A 5 mm nodule in the mid esophagus, biopsied  Antral gastritis. Biopsy for H pylori taken  Normal duodenum  Dictated #[id number]

## 2012-02-05 NOTE — Progress Notes (Signed)
Post - Anesthesia Evaluation & Assessment Note:    Name: Amber [name]  Age: 46 y.o.  Sex: female  DOB: [birth year]  MRN: [id number]  CSN: [id number]     Patient is s/p (type of anesthesia): MAC    Visit Vitals   Item Reading   ??? BP 101/73   ??? Pulse 112   ??? Temp 97.8 ??F (36.6 ??C)   ??? Resp 14   ??? Ht 5\' 4"  (1.626 m)   ??? Wt 66.906 kg (147 lb 8 oz)   ??? BMI 25.32 kg/m2   ??? SpO2 99%     Nausea/Vomiting: no nausea and no vomiting       Post-operative hydration adequate.      Pain score: 0      Mental status & level of consciousness: somewhat drowsy, but oriented x 3.    Neurological status: moves all extremities, sensation grossly intact.      Pulmonary status: airway patent, receiving supplemental oxygen.      No cyanosis noted.  Swallowing and gag reflex intact/normal.      Complications related to anesthesia: None.     Additional comments: None.      Patient has met all discharge requirements.       Dian Situ, MD    02/05/2012 9:26 AM

## 2012-02-06 MED FILL — PROPOFOL 10 MG/ML IV EMUL: 10 mg/mL | INTRAVENOUS | Qty: 7

## 2012-02-12 ENCOUNTER — Encounter

## 2012-03-06 LAB — PAIN MGMT PANEL, URINE W/RFLX CONFIRM
Amphetamine Screen, urine: NEGATIVE ng/mL
Cannabinoid Screen, urine: NEGATIVE ng/mL
Cocaine Metab. Screen, urine: NEGATIVE ng/mL
Creatinine, urine: 18.3 mg/dL — ABNORMAL LOW (ref 20.0–300.0)
Fentanyl Screen, urine: NEGATIVE pg/mL
Meperidine Screen, urine: NEGATIVE ng/mL
Methadone Screen, urine: NEGATIVE ng/mL
Opiate Screen, urine: NEGATIVE ng/mL
Oxycodone/Oxymorphone, urine: NEGATIVE ng/mL
Phencyclidine Screen, urine: NEGATIVE ng/mL
Propoxyphene Screen, urine: NEGATIVE ng/mL
Specific Gravity: 1.0015
Tramadol Screen, urine: NEGATIVE ng/mL
pH, urine: 6.6 (ref 4.5–8.9)

## 2012-03-06 LAB — BENZODIAZEPINES, CONFIRM
Alprazolam confirm: 144 ng/mL
Alprazolam: POSITIVE — AB
Benzodiazepines: POSITIVE ng/mL — AB
Clonazepam: NEGATIVE
Flurazepam: NEGATIVE
Lorazepam: NEGATIVE
Midazolam: NEGATIVE
Nordiazepam: NEGATIVE
Oxazepam: NEGATIVE
Temazepam: NEGATIVE
Triazolam: NEGATIVE

## 2012-03-06 LAB — BARBITURATES, CONFIRM
Amobarbital: NEGATIVE
Barbiturates: POSITIVE — AB
Butalbital: NEGATIVE
Pentobarbital: NEGATIVE
Phenobarbital confirm: 585 ng/mL
Phenobarbital: POSITIVE — AB
Secobarbital: NEGATIVE

## 2012-03-20 NOTE — Progress Notes (Signed)
SCREENING MAMMOGRAM COMPLETED.

## 2012-06-12 ENCOUNTER — Encounter

## 2012-06-17 NOTE — Progress Notes (Signed)
Mri cervical and lumbar spine completed without complication. Amber [name]

## 2012-07-15 ENCOUNTER — Encounter

## 2013-03-22 ENCOUNTER — Inpatient Hospital Stay
Admit: 2013-03-22 | Discharge: 2013-03-25 | Disposition: A | Discharge status: Home / Self Care | Payer: BLUE CROSS/BLUE SHIELD | Attending: Internal Medicine | Admitting: Internal Medicine

## 2013-03-22 DIAGNOSIS — S82843A Displaced bimalleolar fracture of unspecified lower leg, initial encounter for closed fracture: Secondary | ICD-10-CM

## 2013-03-22 MED ORDER — KETOROLAC TROMETHAMINE 30 MG/ML INJECTION
30 mg/mL (1 mL) | INTRAMUSCULAR | Status: AC
Start: 2013-03-22 — End: 2013-03-22
  Administered 2013-03-22: 23:00:00 via INTRAVENOUS

## 2013-03-22 MED ORDER — HYDROMORPHONE 2 MG/ML INJECTION SOLUTION
2 mg/mL | INTRAMUSCULAR | Status: AC
Start: 2013-03-22 — End: 2013-03-22
  Administered 2013-03-22: 23:00:00 via INTRAVENOUS

## 2013-03-22 MED ORDER — ONDANSETRON (PF) 4 MG/2 ML INJECTION
4 mg/2 mL | INTRAMUSCULAR | Status: AC
Start: 2013-03-22 — End: 2013-03-22
  Administered 2013-03-22: 23:00:00 via INTRAVENOUS

## 2013-03-22 NOTE — ED Notes (Signed)
Criselda Peaches PA at bedside.

## 2013-03-22 NOTE — ED Notes (Signed)
Lab states that Nasal MRSA by PCR is negative.

## 2013-03-22 NOTE — ED Notes (Signed)
Report received from [facility]. Patient resting in bed with her spouse at bedside. Medicated per [facility] for pain. No further voiced needs at this time. No s/s of acute distress noted.

## 2013-03-22 NOTE — ED Provider Notes (Signed)
Patient is a 47 y.o. female presenting with ankle problem. The history is provided by the patient.   Ankle Injury   This is a new problem. The current episode started less than 1 hour ago. The problem occurs constantly. The problem has not changed since onset.The pain is present in the left ankle. The pain is at a severity of 10/10. Pertinent negatives include no numbness, full range of motion, no stiffness, no tingling, no itching, no back pain and no neck pain. The symptoms are aggravated by movement and palpation. There has been a history of trauma.        Past Medical History   Diagnosis Date   ??? Neuropathy, diabetic    ??? HTN (hypertension)    ??? IBS (irritable bowel syndrome)    ??? OSA (obstructive sleep apnea)    ??? GERD (gastroesophageal reflux disease)    ??? Psychiatric disorder      "nervousness"   ??? Unspecified sleep apnea      C-PAP   ??? Migraine    ??? Hypercholesteremia    ??? Diabetes      Borderline   ??? Thyroid disease      thyroidectomy   ??? Seizures      2012   ??? Difficult intubation      1992        Past Surgical History   Procedure Laterality Date   ??? Delivery c-section     ??? Hx cholecystectomy     ??? Hx other surgical       thyroidectomy april 2012   ??? Hx tubal ligation       hysterectomy         Family History   Problem Relation Age of Onset   ??? Arthritis-osteo Mother    ??? Cancer Mother    ??? Migraines Mother    ??? Headache Mother    ??? Heart Disease Mother    ??? Heart Disease Father    ??? Asthma Sister    ??? Lung Disease Brother    ??? Arthritis-osteo Maternal Grandmother    ??? Cancer Maternal Grandmother    ??? Diabetes Maternal Grandmother    ??? Hypertension Maternal Grandmother    ??? Stroke Maternal Grandmother    ??? Hypertension Maternal Grandfather    ??? Alcohol abuse Neg Hx    ??? Bleeding Prob Neg Hx    ??? Elevated Lipids Neg Hx    ??? Psychiatric Disorder Neg Hx    ??? Mental Retardation Neg Hx         History     Social History   ??? Marital Status: MARRIED     Spouse Name: N/A     Number of Children: N/A   ??? Years of  Education: N/A     Occupational History   ??? Not on file.     Social History Main Topics   ??? Smoking status: Current Every Day Smoker -- 1.00 packs/day for 27 years   ??? Smokeless tobacco: Never Used   ??? Alcohol Use: No   ??? Drug Use: 6.00 per week     Special: Marijuana      Comment: vicodin or lortab   ??? Sexually Active: Yes -- Female partner(s)     Birth Control/ Protection: Surgical     Other Topics Concern   ??? Not on file     Social History Narrative   ??? No narrative on file  ALLERGIES: Topamax; Ultram; Nasal spray; Aspirin; Ciprofloxacin; Norvasc; Levsin; and Pcn      Review of Systems   Constitutional: Negative for fever and chills.   HENT: Negative for ear pain, congestion, sore throat, rhinorrhea, trouble swallowing, neck pain, neck stiffness, dental problem, sinus pressure and ear discharge.    Eyes: Negative for photophobia, pain and discharge.   Respiratory: Negative for cough, chest tightness, shortness of breath, wheezing and stridor.    Cardiovascular: Negative for chest pain, palpitations and leg swelling.   Gastrointestinal: Negative for nausea, vomiting, abdominal pain, diarrhea, constipation and blood in stool.   Genitourinary: Negative for dysuria, urgency, frequency, hematuria, flank pain, decreased urine volume and difficulty urinating.   Musculoskeletal: Negative for myalgias, back pain, arthralgias and stiffness.   Skin: Negative for color change, itching, rash and wound.   Neurological: Negative for dizziness, tingling, seizures, syncope, light-headedness, numbness and headaches.   Hematological: Negative for adenopathy. Does not bruise/bleed easily.   Psychiatric/Behavioral: Negative for suicidal ideas, self-injury and agitation. The patient is not nervous/anxious.    All other systems reviewed and are negative.        Filed Vitals:    03/22/13 1821   BP: 122/82   Pulse: 67   Temp: 98.6 ??F (37 ??C)   Resp: 16   Height: 5\' 5"  (1.651 m)   Weight: 68.04 kg (150 lb)   SpO2: 99%             Physical Exam   Nursing note and vitals reviewed.  Constitutional: She is oriented to person, place, and time. She appears well-developed.   HENT:   Head: Normocephalic and atraumatic.   Right Ear: External ear normal.   Left Ear: External ear normal.   Nose: Nose normal.   Mouth/Throat: Oropharynx is clear and moist. No oropharyngeal exudate.   Eyes: Conjunctivae are normal. Pupils are equal, round, and reactive to light. No scleral icterus.   Neck: Normal range of motion. Neck supple. No JVD present. No tracheal deviation present. No thyromegaly present.   Cardiovascular: Normal rate, normal heart sounds and intact distal pulses.    No murmur heard.  Pulmonary/Chest: Effort normal and breath sounds normal. No respiratory distress. She has no wheezes. She has no rales. She exhibits no tenderness.   Abdominal: Soft. Bowel sounds are normal. She exhibits no distension and no mass. There is no tenderness. There is no rebound and no guarding.   Musculoskeletal: She exhibits no edema and no tenderness.        Left ankle: She exhibits decreased range of motion, swelling, ecchymosis and deformity. She exhibits no laceration. Tenderness. No head of 5th metatarsal and no proximal fibula tenderness found. Achilles tendon normal.   Lymphadenopathy:     She has no cervical adenopathy.   Neurological: She is alert and oriented to person, place, and time. She has normal strength and normal reflexes. No cranial nerve deficit or sensory deficit. GCS eye subscore is 4. GCS verbal subscore is 5. GCS motor subscore is 6.   Skin: Skin is warm and dry. No rash noted.   No track marks noted   Psychiatric: She has a normal mood and affect. Her behavior is normal.        MDM     Amount and/or Complexity of Data Reviewed:   Tests in the radiology section of CPT??:  Reviewed and ordered  Discussion of test results with the performing providers:  No   Decide to obtain previous medical records or  to obtain history from someone other than  the patient:  No   Obtain history from someone other than the patient:  No   Review and summarize past medical records:  No   Discuss the patient with another provider:  Yes   Independant visualization of image, tracing, or specimen:  Yes      Reduction of Joint  Consent: Verbal consent obtained.  Consent given by: patient  Date/Time: 03/22/2013 10:38 PM  Performed by: PASupervising provider: spears  Pre-proc eval:Immediately prior to the procedure, the patient was reevaluated and found suitable for the planned procedure and any planned medications.  Dislocation reduced: left ankle  Reduction method: direct traction  Post-reduction assessment: neurologic function unchanged and distal perfusion intact  Procedure: Successful  Patient tolerance: Patient tolerated the procedure well with no immediate complications  My total time at bedside, performing this procedure was 1-15 minutes.  Comments: Fracture care given  Splint, Sugar Tong  Date/Time: 03/22/2013 10:39 PM  Performed by: PASupervising provider: spears  Pre-procedure re-eval: Immediately prior to the procedure, the patient was reevaluated and found suitable for the planned procedure and any planned medications.  Location details: left ankle  Splint type: sugar tong  Approach: lateral and medial  Supplies used: cotton padding, elastic bandage and Ortho-Glass  Post-procedure: The splinted body part was neurovascularly unchanged following the procedure.  Patient tolerance: Patient tolerated the procedure well with no immediate complications.  My total time at bedside, performing this procedure was 1-15 minutes.  Comments: fractur care given

## 2013-03-22 NOTE — ED Provider Notes (Signed)
I personally saw and examined the patient.  I have reviewed and agree with the MLP's findings, including all diagnostic interpretations, and plans as written.   I was present during the key portions of separately billed procedures.    Lamon Rotundo, MD

## 2013-03-22 NOTE — ED Notes (Signed)
Patient complained of left ankle pain after trip and fall over dog.

## 2013-03-23 LAB — METABOLIC PANEL, BASIC
Anion gap: 10 mmol/L (ref 6–15)
BUN/Creatinine ratio: 13 (ref 7–25)
BUN: 9 MG/DL (ref 7–18)
CO2: 27 mmol/L (ref 21–32)
Calcium: 9.5 MG/DL (ref 8.5–10.1)
Chloride: 102 mmol/L (ref 98–107)
Creatinine: 0.7 MG/DL (ref 0.60–1.30)
GFR est AA: >60 mL/min/{1.73_m2} (ref >60)
GFR est non-AA: >60 mL/min/{1.73_m2} (ref >60)
Glucose: 76 mg/dL (ref 70–110)
Potassium: 3.3 mmol/L — ABNORMAL LOW (ref 3.5–5.3)
Sodium: 139 mmol/L (ref 136–145)

## 2013-03-23 LAB — CBC WITH AUTOMATED DIFF
ABS. BASOPHILS: 0 10*3/uL (ref 0.0–0.1)
ABS. EOSINOPHILS: 0 10*3/uL (ref 0.0–0.5)
ABS. LYMPHOCYTES: 2.7 10*3/uL (ref 0.8–3.5)
ABS. MONOCYTES: 0.5 10*3/uL — ABNORMAL LOW (ref 0.8–3.5)
ABS. NEUTROPHILS: 6.4 10*3/uL (ref 1.5–8.0)
BASOPHILS: 0 % (ref 0–2)
EOSINOPHILS: 0 % (ref 0–5)
HCT: 38.4 % — ABNORMAL LOW (ref 41–53)
HGB: 13.8 g/dL (ref 12.0–16.0)
LYMPHOCYTES: 28 % (ref 19–48)
MCH: 32.5 PG — ABNORMAL HIGH (ref 27–31)
MCHC: 35.9 g/dL (ref 31–37)
MCV: 90.4 FL (ref 80–100)
MONOCYTES: 5 % (ref 3–9)
MPV: 10.5 FL — ABNORMAL HIGH (ref 5.9–10.3)
NEUTROPHILS: 67 % (ref 40–74)
PLATELET: 195 10*3/uL (ref 130–400)
RBC: 4.25 M/uL (ref 4.2–5.4)
RDW: 12.6 % (ref 11.5–14.5)
WBC: 9.6 10*3/uL (ref 4.5–10.8)

## 2013-03-23 LAB — MRSA SCREEN - PCR (NASAL): MRSA by PCR, Nasal: NEGATIVE

## 2013-03-23 MED ORDER — HYDROMORPHONE 2 MG/ML INJECTION SOLUTION
2 mg/mL | Freq: Once | INTRAMUSCULAR | Status: AC
Start: 2013-03-23 — End: 2013-03-22
  Administered 2013-03-23: 01:00:00 via INTRAVENOUS

## 2013-03-23 MED ORDER — ALPRAZOLAM 1 MG TAB
1 mg | Freq: Four times a day (QID) | ORAL | Status: DC
Start: 2013-03-23 — End: 2013-03-25
  Administered 2013-03-24 – 2013-03-25 (×5): via ORAL

## 2013-03-23 MED ORDER — DEXTROSE 5% IN NORMAL SALINE IV
INTRAVENOUS | Status: DC
Start: 2013-03-23 — End: 2013-03-24
  Administered 2013-03-23: 08:00:00 via INTRAVENOUS

## 2013-03-23 MED ORDER — ACETAMINOPHEN 325 MG TABLET
325 mg | ORAL | Status: DC | PRN
Start: 2013-03-23 — End: 2013-03-25

## 2013-03-23 MED ORDER — HYDROMORPHONE 2 MG/ML INJECTION SOLUTION
2 mg/mL | Freq: Once | INTRAMUSCULAR | Status: DC
Start: 2013-03-23 — End: 2013-03-22

## 2013-03-23 MED ORDER — CONJUGATED ESTROGENS 0.625 MG TAB
0.625 mg | Freq: Every day | ORAL | Status: DC
Start: 2013-03-23 — End: 2013-03-23

## 2013-03-23 MED ORDER — LOSARTAN-HYDROCHLOROTHIAZIDE 100 MG-12.5 MG TAB
Freq: Every day | ORAL | Status: DC
Start: 2013-03-23 — End: 2013-03-23

## 2013-03-23 MED ORDER — HYDROCHLOROTHIAZIDE 25 MG TAB
25 mg | Freq: Every day | ORAL | Status: DC
Start: 2013-03-23 — End: 2013-03-24
  Administered 2013-03-24: 13:00:00 via ORAL

## 2013-03-23 MED ORDER — LOSARTAN 50 MG TAB
50 mg | Freq: Every day | ORAL | Status: DC
Start: 2013-03-23 — End: 2013-03-25
  Administered 2013-03-24 – 2013-03-25 (×2): via ORAL

## 2013-03-23 MED ORDER — HYDROMORPHONE 2 MG/ML INJECTION SOLUTION
2 mg/mL | Freq: Four times a day (QID) | INTRAMUSCULAR | Status: DC | PRN
Start: 2013-03-23 — End: 2013-03-25
  Administered 2013-03-23 – 2013-03-24 (×5): via INTRAVENOUS

## 2013-03-23 MED ORDER — ESOMEPRAZOLE MAGNESIUM 40 MG CAP, DELAYED RELEASE
40 mg | Freq: Every day | ORAL | Status: DC
Start: 2013-03-23 — End: 2013-03-25
  Administered 2013-03-24 – 2013-03-25 (×2): via ORAL

## 2013-03-23 MED ORDER — LEVOTHYROXINE 50 MCG TAB
50 mcg | Freq: Every day | ORAL | Status: DC
Start: 2013-03-23 — End: 2013-03-25
  Administered 2013-03-24 – 2013-03-25 (×2): via ORAL

## 2013-03-23 MED ORDER — ATORVASTATIN 10 MG TAB
10 mg | Freq: Every day | ORAL | Status: DC
Start: 2013-03-23 — End: 2013-03-24

## 2013-03-23 MED ORDER — HYDROMORPHONE 2 MG/ML INJECTION SOLUTION
2 mg/mL | Freq: Once | INTRAMUSCULAR | Status: DC
Start: 2013-03-23 — End: 2013-03-22
  Administered 2013-03-23: 01:00:00 via INTRAVENOUS

## 2013-03-23 MED ORDER — CYANOCOBALAMIN 500 MCG TAB
500 mcg | Freq: Every day | ORAL | Status: DC
Start: 2013-03-23 — End: 2013-03-25
  Administered 2013-03-24 – 2013-03-25 (×2): via ORAL

## 2013-03-23 MED ORDER — HYDROMORPHONE 2 MG/ML INJECTION SOLUTION
2 mg/mL | INTRAMUSCULAR | Status: AC
Start: 2013-03-23 — End: 2013-03-22
  Administered 2013-03-23: 01:00:00 via INTRAVENOUS

## 2013-03-23 MED ORDER — PRIMIDONE 50 MG TAB
50 mg | Freq: Two times a day (BID) | ORAL | Status: DC
Start: 2013-03-23 — End: 2013-03-25
  Administered 2013-03-24 – 2013-03-25 (×3): via ORAL

## 2013-03-23 MED ORDER — ONDANSETRON (PF) 4 MG/2 ML INJECTION
4 mg/2 mL | INTRAMUSCULAR | Status: DC | PRN
Start: 2013-03-23 — End: 2013-03-25
  Administered 2013-03-24: 13:00:00 via INTRAVENOUS

## 2013-03-23 MED ORDER — HYDROMORPHONE 2 MG/ML INJECTION SOLUTION
2 mg/mL | INTRAMUSCULAR | Status: AC
Start: 2013-03-23 — End: 2013-03-22
  Administered 2013-03-23: 02:00:00 via INTRAVENOUS

## 2013-03-23 MED ORDER — DOCUSATE SODIUM 100 MG CAP
100 mg | Freq: Every day | ORAL | Status: DC
Start: 2013-03-23 — End: 2013-03-25
  Administered 2013-03-24 – 2013-03-25 (×2): via ORAL

## 2013-03-23 MED ORDER — NALOXONE 1 MG/ML IJ SYRG(AKA NARCAN)
1 mg/mL | INTRAMUSCULAR | Status: AC
Start: 2013-03-23 — End: 2013-03-23

## 2013-03-23 NOTE — Op Note (Signed)
[facility]      PT Name:  Amber [name], Amber [name]            Admitted:  03/22/2013  MR#:  [id number]                     DOB:  Nov 10, 1966  Account #:  [id number]            Age:  47  Dictator:  Gladys Damme            Location:      OPERATIVE REPORT    SURGERY DATE: 03/23/2013    PREOPERATIVE DIAGNOSIS:  Displaced bimalleolar fracture of left ankle.    POSTOPERATIVE DIAGNOSIS:  Displaced bimalleolar fracture of left ankle.    OPERATION:  Open reduction and internal fixation of left ankle.    SURGEON:  Dr. Sharmon Leyden  ANESTHETIST:  Dr. Jamison Neighbor  ANESTHETIC:  General intubational    PROCEDURE:  Under intubational anesthesia, the patient's left leg was scrubbed,  prepped and draped in the usual fashion.  After confirmation of site  and procedure, pneumatic tourniquet was inflated to 300 mmHg.   A  mid lateral incision was made through the skin and subcutaneous  tissues down to the fibula.  There was moderate comminution with a  long spike.   The nerve crossed the fracture site and was carefully  protected.    The fracture was reduced and held with a single interfragmentary  screw.  A 10-hole, one-third tubular plate was then contoured and  affixed to the lateral side.   Good fixation was obtained.  The  reduction was adequate.    The medial side was approached through a mid medial incision through  the skin and subcutaneous tissues down to the fracture site which  was cleared of debris.  The fracture was then reduced and held  temporarily with K wires.   Fluoroscopy showed that the fracture was  well reduced.   Clinically and radiographically, the fracture  appeared stable with motion.    Both wounds were thoroughly irrigated.  Subcutaneous tissues were  closed using 2-0 Vicryl.  Staples were used to close the skin.  Tourniquet was released.  Total tourniquet time being 41 minutes at  300 mmHg.   Bulky dressing and splint were applied.  Anesthesia was  reversed.  The patient was returned to the  Recovery Room.      _____________________________  Gladys Damme, M.D.    WU:JW DD: 03/23/2013 22:54:42  DT: 03/24/2013 10:19:29 Job ID:  [id number]  CC:          Document #:  [id number]

## 2013-03-23 NOTE — Op Note (Signed)
Operative Note      Patient: Amber [name]               Sex: female          DOA: @INPADMDT @       Date of Birth:  02/13/1966      Age:  47 y.o.        LOS:  LOS: 1 day     Preoperative Diagnosis: Left ankle bimalleolar fracture    Postoperative Diagnosis:  Left ankle bimalleolar fracture    Surgeon: Surgeon(s) and Role:     * Gladys Damme, MD - Primary    Anesthesia:  General    Procedure:  Procedure(s) with comments:  LEFT ANKLE OPEN REDUCTION INTERNAL FIXATION - Left ankle open reduction internal fixation     Procedure in Detail: orif fracture left ankle   Estimated Blood Loss:  minimal                      Implants: * No implants in log *    Specimens: * No specimens in log *     Drains: None           Complications:  None           Counts: Sponge and needle counts were correct at the end of the procedure    D [id number]

## 2013-03-23 NOTE — Progress Notes (Signed)
Chart screened for discharge planning needs per Case Management protocol.  Referral for CM department per readmit risk tool.  CM staff to follow re: DCP needs.

## 2013-03-23 NOTE — ED Notes (Signed)
Ok to transfer to floor per Dr. Yehuda Budd.

## 2013-03-23 NOTE — Op Note (Signed)
[facility]      PT Name:  [name], Amber S            Admitted:  03/22/2013  MR#:  [id number]                     DOB:  12/13/1966  Account #:  [id number]            Age:  46  Dictator:  Nichalas Coin            Location:      OPERATIVE REPORT    SURGERY DATE: 03/23/2013    PREOPERATIVE DIAGNOSIS:  Displaced bimalleolar fracture of left ankle.    POSTOPERATIVE DIAGNOSIS:  Displaced bimalleolar fracture of left ankle.    OPERATION:  Open reduction and internal fixation of left ankle.    SURGEON:  Dr. Williemae Muriel  ANESTHETIST:  Dr. Nestor  ANESTHETIC:  General intubational    PROCEDURE:  Under intubational anesthesia, the patient's left leg was scrubbed,  prepped and draped in the usual fashion.  After confirmation of site  and procedure, pneumatic tourniquet was inflated to 300 mmHg.   A  mid lateral incision was made through the skin and subcutaneous  tissues down to the fibula.  There was moderate comminution with a  long spike.   The nerve crossed the fracture site and was carefully  protected.    The fracture was reduced and held with a single interfragmentary  screw.  A 10-hole, one-third tubular plate was then contoured and  affixed to the lateral side.   Good fixation was obtained.  The  reduction was adequate.    The medial side was approached through a mid medial incision through  the skin and subcutaneous tissues down to the fracture site which  was cleared of debris.  The fracture was then reduced and held  temporarily with K wires.   Fluoroscopy showed that the fracture was  well reduced.   Clinically and radiographically, the fracture  appeared stable with motion.    Both wounds were thoroughly irrigated.  Subcutaneous tissues were  closed using 2-0 Vicryl.  Staples were used to close the skin.  Tourniquet was released.  Total tourniquet time being 41 minutes at  300 mmHg.   Bulky dressing and splint were applied.  Anesthesia was  reversed.  The patient was returned to the  Recovery Room.      _____________________________  Dermot Gremillion, M.D.    GA:df DD: 03/23/2013 22:54:42  DT: 03/24/2013 10:19:29 Job ID:  [id number]  CC:          Document #:  [id number]

## 2013-03-23 NOTE — Consults (Signed)
[facility]      PT Name:  Amber [name], Amber [name]            Admitted:  03/22/2013  MR#:  [id number]                     DOB:  1966-05-14  Account #:  [id number]            Age:  47  Dictator:  Amber [name]            Location:           CONSULTATION REPORT    DATE OF CONSULTATION:    03/23/2013  REFERRING PHYSICIAN:          Nani [name]    REASON FOR CONSULTATION:  Displaced bimalleolar fracture left ankle    HISTORY OF PRESENT ILLNESS:  I was asked to see this lady by the Emergency Room physicians and  Amber [name] for evaluation and treatment of an injury to the left  ankle.  The patient is a pleasant 47 year old female who slipped  yesterday evening while chasing her neighbor's dog.  She sustained a  bimalleolar fracture of her ankle.  She was brought to the Emergency  Department.  Attempts at reduction were incompletely successful.  She was admitted for definitive surgical intervention.    PAST MEDICAL HISTORY:  1. History of pseudo-seizures.  2. Hypertension.  3. Hypothyroidism.  4. Gastroesophageal reflux disease.  5. Anxiety.    CURRENT MEDICATIONS:  1. Losartan.  2. Bystolic.  3. Xanax.  4. Lipitor.  5. Synthroid.    ALLERGIES:  1. TOPAMAX.  2. ULTRAM.  3. PENICILLIN.    PHYSICAL EXAMINATION:  When I examined her she is a pleasant 47 year old female in no acute  distress.   Her left leg is splinted.  She had decreased sensation  throughout the foot in a nondermatomal pattern.   She felt as if her  toes were all swollen.  There was only minimal swelling within the  ankle.  She was in a sugar-tong splint which was not removed.  Dorsalis pedis pulse was strong.   Tibialis posterior could not be  palpated because of the splint.      RADIOLOGIC AND DIAGNOSTIC DATA:  X-rays show a displaced bimalleolar fracture with a long spike up  the fibula.  There is no definite diastasis but there is talar  shift.    ASSESSMENT AND RECOMMENDATIONS:  Treatment options of nonoperative versus  operative intervention were  discussed.  The risks of anesthesia, infection, persistent pain and  need for further surgery were explained to, discussed with and  apparently understood by Amber [name] who nonetheless consented to  proceed with open reduction and internal fixation of her fracture  which will be carried out on an emergent basis.              __________________________________  Amber [name], M.D.    GA:klg  DD: 03/23/2013 21:26:08  DT: 03/24/2013 07:39:05  Job ID:  [id number]  CC:      Document #:  [id number]

## 2013-03-23 NOTE — Progress Notes (Signed)
TRANSFER - IN REPORT:    Verbal report received from Keri RN(name) on Amber [name]  being received from ER(unit) for routine progression of care      Report consisted of patient???s Situation, Background, Assessment and   Recommendations(SBAR).     Information from the following report(s) SBAR was reviewed with the receiving nurse.    Opportunity for questions and clarification was provided.      Assessment completed upon patient???s arrival to unit and care assumed.

## 2013-03-23 NOTE — H&P (Signed)
History and Physical        Patient: Amber [name]               Sex: female             MRN: [id number]     Date of Birth:  September 05, 1966      Age:  47 y.o.              Subjective:   CC: Fracture Ankle with dislocation    Amber [name] is a 48 y.o. female who presented to ER after she fell down in her yard while chasing her neighbor's dog. Pt denies any chest pain, shortness of breath. She denies any loss of consciousness.   No abdominal pain. BP controlled. She continues to smoke. C/O neck stiffness and pain.  She was found to have bi-maleolar fracture and talar dislocation.    Past Medical History   Diagnosis Date   ??? Neuropathy, diabetic    ??? HTN (hypertension)    ??? IBS (irritable bowel syndrome)    ??? OSA (obstructive sleep apnea)    ??? GERD (gastroesophageal reflux disease)    ??? Psychiatric disorder      "nervousness"   ??? Unspecified sleep apnea      C-PAP   ??? Migraine    ??? Hypercholesteremia    ??? Diabetes      Borderline   ??? Thyroid disease      thyroidectomy   ??? Seizures      2012   ??? Difficult intubation      1992       Past Surgical History   Procedure Laterality Date   ??? Delivery c-section     ??? Hx cholecystectomy     ??? Hx other surgical       thyroidectomy april 2012   ??? Hx tubal ligation       hysterectomy       Family History   Problem Relation Age of Onset   ??? Arthritis-osteo Mother    ??? Cancer Mother    ??? Migraines Mother    ??? Headache Mother    ??? Heart Disease Mother    ??? Heart Disease Father    ??? Asthma Sister    ??? Lung Disease Brother    ??? Arthritis-osteo Maternal Grandmother    ??? Cancer Maternal Grandmother    ??? Diabetes Maternal Grandmother    ??? Hypertension Maternal Grandmother    ??? Stroke Maternal Grandmother    ??? Hypertension Maternal Grandfather    ??? Alcohol abuse Neg Hx    ??? Bleeding Prob Neg Hx    ??? Elevated Lipids Neg Hx    ??? Psychiatric Disorder Neg Hx    ??? Mental Retardation Neg Hx        History     Social History   ??? Marital Status: MARRIED     Spouse Name: N/A     Number of Children: N/A    ??? Years of Education: N/A     Social History Main Topics   ??? Smoking status: Current Every Day Smoker -- 1.00 packs/day for 27 years   ??? Smokeless tobacco: Never Used   ??? Alcohol Use: No   ??? Drug Use: 6.00 per week     Special: Marijuana      Comment: vicodin or lortab   ??? Sexually Active: Yes -- Female partner(s)     Birth Control/ Protection: Surgical     Other Topics  Concern   ??? Not on file     Social History Narrative   ??? No narrative on file       Prior to Admission medications    Medication Sig Start Date End Date Taking? Authorizing Provider   esomeprazole (NEXIUM) 40 mg capsule Take 40 mg by mouth daily. Indications: GASTROESOPHAGEAL REFLUX   Yes Historical Provider   atorvastatin (LIPITOR) 40 mg tablet Take 40 mg by mouth daily.     Yes Historical Provider   losartan-hydrochlorothiazide (HYZAAR) 100-12.5 mg per tablet Take 1 Tab by mouth daily. 12/23/11  Yes Verdell Face, NP   levothyroxine (SYNTHROID) 125 mcg tablet Take 150 mcg by mouth Daily (before breakfast).   Yes Historical Provider   conjugated estrogens (PREMARIN) 1.25 mg tablet Take 1.25 mg by mouth daily.     Yes Phys Other, MD   alprazolam Prudy Feeler) 1 mg tablet Take 1 mg by mouth four (4) times daily.   Yes Phys Other, MD   cyanocobalamin (VITAMIN B-12) 500 mcg tablet Take 500 mcg by mouth daily.      Historical Provider   ergocalciferol (VITAMIN D2) 50,000 unit capsule Take 50,000 Units by mouth.      Historical Provider   hydrOXYzine (VISTARIL) 25 mg capsule Take 25 mg by mouth three (3) times daily as needed.      Historical Provider   primidone (MYSOLINE) 50 mg tablet Take 50 mg by mouth two (2) times a day.      Phys Other, MD   omeprazole (PRILOSEC) 20 mg capsule Take 20 mg by mouth daily.    Phys Other, MD   atropine-phenobarbital-scopolamine-hyoscyamine [facility]) 16.2-0.1037 -0.0194 mg per tablet Take 1 Tab by mouth every six (6) hours as needed.    Phys Other, MD   PROMETHAZINE HCL (PHENERGAN PO) Take 25 mg by mouth daily as needed.     Phys Other, MD       Allergies   Allergen Reactions   ??? Topamax (Topiramate) Anaphylaxis   ??? Ultram (Tramadol) Itching   ??? Nasal Spray (Sodium Chloride) Other (comments)     Migraine nasal spray makes her throat bleed   ??? Aspirin Other (comments)     Swelling "knot on side of neck"   ??? Ciprofloxacin Other (comments)     Causes facial redness     ??? Norvasc (Amlodipine) Hives   ??? Levsin (Hyoscyamine Sulfate) Other (comments)     Blurred vision     ??? Pcn (Penicillins) Nausea and Vomiting       Review of Systems  A comprehensive review of systems was negative except for that written in the History of Present Illness.    Objective:      Visit Vitals   Item Reading   ??? BP 126/63   ??? Pulse 70   ??? Temp 97.3 ??F (36.3 ??C)   ??? Resp 16   ??? Ht 5\' 5"  (1.651 m)   ??? Wt 150 lb   ??? BMI 24.96 kg/m2   ??? SpO2 99%       Physical Exam:    General appearance: alert, cooperative, no distress  Head: Normocephalic, without obvious abnormality, atraumatic  Eyes: conjunctivae/corneas clear. PERRL, EOM's intact.  Neck: supple, no adenopathy, no carotid bruit  Lungs: clear to auscultation bilaterally  Heart: regular rate and rhythm, S1, S2 normal, no murmur, no JVD or pedal edema  Abdomen: soft, non-tender, non-distended, bowel sounds present, no organomegaly  Extremities: limited movement;lt ankle with a bandage  Pulses:  2+ and symmetric  Neurologic: Alert and oriented X 3, normal strength and tone. Normal symmetric reflexes. Normal coordination and gait        Labs:  Recent Results (from the past 24 hour(s))   CBC WITH AUTOMATED DIFF    Collection Time     03/22/13  7:05 PM       Result Value Range    WBC 9.6  4.5 - 10.8 K/uL    RBC 4.25  4.2 - 5.4 M/uL    HGB 13.8  12.0 - 16.0 g/dL    HCT 96.0 (*) 41 - 53 %    MCV 90.4  80 - 100 FL    MCH 32.5 (*) 27 - 31 PG    MCHC 35.9  31 - 37 g/dL    RDW 45.4  09.8 - 11.9 %    PLATELET 195  130 - 400 K/uL    MPV 10.5 (*) 5.9 - 10.3 FL    NEUTROPHILS 67  40 - 74 %    LYMPHOCYTES 28  19 - 48 %    MONOCYTES 5   3 - 9 %    EOSINOPHILS 0  0 - 5 %    BASOPHILS 0  0 - 2 %    ABS. NEUTROPHILS 6.4  1.5 - 8.0 K/UL    ABS. LYMPHOCYTES 2.7  0.8 - 3.5 K/UL    ABS. MONOCYTES 0.5 (*) 0.8 - 3.5 K/UL    ABS. EOSINOPHILS 0.0  0.0 - 0.5 K/UL    ABS. BASOPHILS 0.0  0.0 - 0.1 K/UL    DF AUTOMATED     METABOLIC PANEL, BASIC    Collection Time     03/22/13  7:05 PM       Result Value Range    Sodium 139  136 - 145 mmol/L    Potassium 3.3 (*) 3.5 - 5.3 mmol/L    Chloride 102  98 - 107 mmol/L    CO2 27  21 - 32 mmol/L    Anion gap 10  6 - 15 mmol/L    Glucose 76  70 - 110 mg/dL    BUN 9  7 - 18 MG/DL    Creatinine 1.47  8.29 - 1.30 MG/DL    BUN/Creatinine ratio 13  7 - 25      GFR est AA >60  >60 ml/min/1.39m2    GFR est non-AA >60  >60 ml/min/1.5m2    Calcium 9.5  8.5 - 10.1 MG/DL   MRSA SCREEN BY PCR    Collection Time     03/22/13 10:30 PM       Result Value Range    MRSA by PCR, Nasal NEGATIVE          Assessment/Plan     Principal Problem:    Fracture of ankle, bimalleolar, left, closed (03/23/2013)    Active Problems:    Dislocation of ankle, closed (03/23/2013)      Essential hypertension, benign (03/23/2013)      Anxiety state, unspecified (03/23/2013)      Plan:  Pain control  Consult Ortho  Cont home meds  Labs in AM  Replace K    Nani Skillern, MD

## 2013-03-23 NOTE — Op Note (Signed)
Operative Note      Patient: Amber [name]               Sex: female          DOA: @INPADMDT@       Date of Birth:  01/28/1966      Age:  46 y.o.        LOS:  LOS: 1 day     Preoperative Diagnosis: Left ankle bimalleolar fracture    Postoperative Diagnosis:  Left ankle bimalleolar fracture    Surgeon: Surgeon(s) and Role:     * Tesean Stump, MD - Primary    Anesthesia:  General    Procedure:  Procedure(s) with comments:  LEFT ANKLE OPEN REDUCTION INTERNAL FIXATION - Left ankle open reduction internal fixation     Procedure in Detail: orif fracture left ankle   Estimated Blood Loss:  minimal                      Implants: * No implants in log *    Specimens: * No specimens in log *     Drains: None           Complications:  None           Counts: Sponge and needle counts were correct at the end of the procedure    D [id number]

## 2013-03-23 NOTE — ED Notes (Signed)
4C nurse unable to take report at this time. Nurse to call back when available.

## 2013-03-23 NOTE — Other (Signed)
TRANSFER - OUT REPORT:    Verbal report given to Dondra Spry RN (name) on Amber [name]  being transferred to Ortho (unit) for routine progression of care       Report consisted of patient???s Situation, Background, Assessment and   Recommendations(SBAR).     Information from the following report(s) SBAR, Intake/Output, MAR and Recent Results was reviewed with the receiving nurse.    Opportunity for questions and clarification was provided.

## 2013-03-23 NOTE — Progress Notes (Signed)
Post - Anesthesia Evaluation & Assessment Note:    Name: Amber [name]  Age: 47 y.o.  Sex: female  DOB: [birth year]  MRN: [id number]  CSN: [id number]     Patient is s/p (type of anesthesia): general anesthesia    Patient status post Procedure(s) with comments:  LEFT ANKLE OPEN REDUCTION INTERNAL FIXATION - Left ankle open reduction internal fixation on 03/23/2013 by Nani Skillern, MD    Visit Vitals   Item Reading   ??? BP 90/63   ??? Pulse 69   ??? Temp 97.8 ??F (36.6 ??C)   ??? Resp 16   ??? Ht 5\' 5"  (1.651 m)   ??? Wt 67.132 kg (148 lb)   ??? BMI 24.63 kg/m2   ??? SpO2 96%     Nausea/Vomiting: no nausea and no vomiting       Post-operative hydration adequate.      Pain score: 3      Mental status & level of consciousness: somewhat drowsy, but oriented x 3.    Neurological status: moves all extremities, sensation grossly intact.      Pulmonary status: airway patent, receiving supplemental oxygen.      No cyanosis noted.  Swallowing and gag reflex intact/normal.      Complications related to anesthesia: None.     Additional comments: None.      Patient has met all discharge requirements.       Ezzard Standing, MD    03/23/2013 11:58 PM

## 2013-03-23 NOTE — Consults (Signed)
Thanks  Bimalleolar fracture left ankle post fall  Hypertension  Meds : bystolic, synthroid,losaartan,lipitor,nexium,xanax, premarin  Allergic: topamax, ultram, penicillins  O/e: NAD  Blood pressure low, getting fluid bolus  Peripheral pulses strong  Decreased sensation throughout foot  Mild bruising  X-ray bimalleolar fracture incompletely reduced  Discussed ORIF and risks. For surgery  D [id number]

## 2013-03-23 NOTE — Progress Notes (Signed)
Pt. Wants to go outside to smoke. Pt. Informed she is not to go outside to smoke. Pt. Informed of her need for nasal O2. Pt. Husband took pt. Outside to smoke in a wheelchair even though both were told she was not allowed to go outside to smoke.

## 2013-03-23 NOTE — Progress Notes (Signed)
Late entry from this AM.  Referral via readmit risk tool.  Introduction to pt, CSW role explained.  Pt lives with spouse in a one story home with 2 steps to enter and has a CPAP at home.  Pt states she has not seen any MDs yet so is uncertain what the plan is for her but states she will need a walker and bsc.  She reports husband will be able to assist PRN.  CSW will follow closer to DC to determine if any HH will be needed.

## 2013-03-24 MED ORDER — CLINDAMYCIN IN D5W 600 MG/50 ML IV PIGGY BACK
600 mg/50 mL | Freq: Once | INTRAVENOUS | Status: AC
Start: 2013-03-24 — End: 2013-03-23
  Administered 2013-03-24: 02:00:00 via INTRAVENOUS

## 2013-03-24 MED ORDER — LIDOCAINE HCL 2 % (20 MG/ML) IJ SOLN
20 mg/mL (2 %) | INTRAMUSCULAR | Status: AC
Start: 2013-03-24 — End: ?

## 2013-03-24 MED ORDER — ONDANSETRON (PF) 4 MG/2 ML INJECTION
4 mg/2 mL | Freq: Once | INTRAMUSCULAR | Status: DC | PRN
Start: 2013-03-24 — End: 2013-03-24

## 2013-03-24 MED ORDER — FENTANYL CITRATE (PF) 50 MCG/ML IJ SOLN
50 mcg/mL | INTRAMUSCULAR | Status: AC
Start: 2013-03-24 — End: ?

## 2013-03-24 MED ORDER — HYDROMORPHONE 2 MG/ML INJECTION SOLUTION
2 mg/mL | INTRAMUSCULAR | Status: DC | PRN
Start: 2013-03-24 — End: 2013-03-24
  Administered 2013-03-24: 03:00:00 via INTRAVENOUS

## 2013-03-24 MED ORDER — EPHEDRINE SULFATE 50 MG/ML IJ SOLN
50 mg/mL | INTRAMUSCULAR | Status: AC
Start: 2013-03-24 — End: ?

## 2013-03-24 MED ORDER — FENTANYL CITRATE (PF) 50 MCG/ML IJ SOLN
50 mcg/mL | INTRAMUSCULAR | Status: DC | PRN
Start: 2013-03-24 — End: 2013-03-24
  Administered 2013-03-24 (×3): via INTRAVENOUS

## 2013-03-24 MED ORDER — SODIUM CHLORIDE 0.9 % IJ SYRG
INTRAMUSCULAR | Status: DC | PRN
Start: 2013-03-24 — End: 2013-03-24

## 2013-03-24 MED ORDER — ONDANSETRON (PF) 4 MG/2 ML INJECTION
4 mg/2 mL | Freq: Once | INTRAMUSCULAR | Status: DC
Start: 2013-03-24 — End: 2013-03-23
  Administered 2013-03-24: 03:00:00 via INTRAVENOUS

## 2013-03-24 MED ORDER — SUCCINYLCHOLINE CHLORIDE 20 MG/ML INJECTION
20 mg/mL | INTRAMUSCULAR | Status: AC
Start: 2013-03-24 — End: ?

## 2013-03-24 MED ORDER — SODIUM CHLORIDE 0.9 % IJ SYRG
Freq: Three times a day (TID) | INTRAMUSCULAR | Status: DC
Start: 2013-03-24 — End: 2013-03-23

## 2013-03-24 MED ORDER — HALOPERIDOL LACTATE 5 MG/ML IJ SOLN
5 mg/mL | Freq: Once | INTRAMUSCULAR | Status: AC
Start: 2013-03-24 — End: 2013-03-23
  Administered 2013-03-24: 03:00:00 via INTRAVENOUS

## 2013-03-24 MED ORDER — LACTATED RINGERS IV
INTRAVENOUS | Status: AC
Start: 2013-03-24 — End: 2013-03-24
  Administered 2013-03-24 (×3): via INTRAVENOUS

## 2013-03-24 MED ORDER — DEXTROSE 5% IN NORMAL SALINE IV
2 mEq/mL | INTRAVENOUS | Status: DC
Start: 2013-03-24 — End: 2013-03-25
  Administered 2013-03-24: 20:00:00 via INTRAVENOUS

## 2013-03-24 MED ORDER — HYDROCODONE-ACETAMINOPHEN 7.5 MG-325 MG TAB
ORAL | Status: DC | PRN
Start: 2013-03-24 — End: 2013-03-25
  Administered 2013-03-24 – 2013-03-25 (×5): via ORAL

## 2013-03-24 MED ORDER — DEXTROSE 5% IN NORMAL SALINE IV
INTRAVENOUS | Status: DC
Start: 2013-03-24 — End: 2013-03-24

## 2013-03-24 MED ORDER — KETOROLAC TROMETHAMINE 30 MG/ML INJECTION
30 mg/mL (1 mL) | INTRAMUSCULAR | Status: AC | PRN
Start: 2013-03-24 — End: 2013-03-23
  Administered 2013-03-24: 03:00:00 via INTRAVENOUS

## 2013-03-24 MED ORDER — SODIUM CHLORIDE 0.9 % IJ SYRG
INTRAMUSCULAR | Status: DC | PRN
Start: 2013-03-24 — End: 2013-03-23

## 2013-03-24 MED ORDER — ROCURONIUM 10 MG/ML IV
10 mg/mL | INTRAVENOUS | Status: AC
Start: 2013-03-24 — End: ?

## 2013-03-24 MED ORDER — ATORVASTATIN 40 MG TAB
40 mg | Freq: Every day | ORAL | Status: DC
Start: 2013-03-24 — End: 2013-03-25
  Administered 2013-03-24 – 2013-03-25 (×2): via ORAL

## 2013-03-24 MED ORDER — PROPOFOL 10 MG/ML IV EMUL
10 mg/mL | INTRAVENOUS | Status: AC
Start: 2013-03-24 — End: ?

## 2013-03-24 MED ORDER — SODIUM CHLORIDE 0.9 % IRRIGATION SOLN
0.9 % | Status: DC | PRN
Start: 2013-03-24 — End: 2013-03-23
  Administered 2013-03-24: 02:00:00

## 2013-03-24 MED ORDER — MIDAZOLAM 1 MG/ML IJ SOLN
1 mg/mL | Freq: Once | INTRAMUSCULAR | Status: AC
Start: 2013-03-24 — End: 2013-03-23
  Administered 2013-03-24: 01:00:00 via INTRAVENOUS

## 2013-03-24 MED FILL — HYDROMORPHONE 2 MG/ML INJECTION SOLUTION: 2 mg/mL | INTRAMUSCULAR | Qty: 1

## 2013-03-24 MED FILL — FENTANYL CITRATE (PF) 50 MCG/ML IJ SOLN: 50 mcg/mL | INTRAMUSCULAR | Qty: 2

## 2013-03-24 MED FILL — MIDAZOLAM 1 MG/ML IJ SOLN: 1 mg/mL | INTRAMUSCULAR | Qty: 2

## 2013-03-24 NOTE — Progress Notes (Signed)
Post op 1  Afebrile  Pain somewhat controlled with analgesic  Foot still diffuse abnormal sensation  Good cap refill  May d/c from my viewpoint with office follow-up

## 2013-03-24 NOTE — Progress Notes (Signed)
Pt up in chair.  Reports walking to BR with nsg.  Seated toe wiggles, knee ext, hip flex, hip ab/add 10 x arom.  Stair techniques reviewed.   Call light in reach.

## 2013-03-24 NOTE — Progress Notes (Signed)
Verbal shift change report given to Jess,RN (oncoming nurse) by Sarah,RN (offgoing nurse).  Report given with Kardex, Procedure Summary, MAR and Recent Results.

## 2013-03-24 NOTE — Progress Notes (Signed)
Follow-up with pt today, she is feeling better.  Walker and bsc arranged through [facility] at her request. RN to notify [facility] of DC (phone:  [phone]) so they can arrange a delivery time with pt.  No  HH ordered, d/w RN who does not feel it will be needed d/t dx.  Pt reports spouse will help her PRN.  Resolved unless otherwise notified.

## 2013-03-24 NOTE — Progress Notes (Signed)
Verbal shift change report given to Sarah RN (oncoming nurse) by Paulene Tayag RN (offgoing nurse).  Report given with SBAR, Kardex and MAR.

## 2013-03-24 NOTE — Progress Notes (Signed)
Progress Note      Patient: Amber [name]               Sex: female          MRN: [id number]           Date of Birth:  1966/01/26      Age:  47 y.o.                    Subjective:     Pt feels okay. C/O some pain in the ankle at surgery site controlled with analgesics. No chest pain, shortness of breath. No N/V/Abdominal pain.    Objective:      Visit Vitals   Item Reading   ??? BP 110/68   ??? Pulse 84   ??? Temp 98 ??F (36.7 ??C)   ??? Resp 18   ??? Ht 5\' 5"  (1.651 m)   ??? Wt 148 lb   ??? BMI 24.63 kg/m2   ??? SpO2 93%       General appearance: alert, cooperative, no distress   Head: Normocephalic, without obvious abnormality, atraumatic   Eyes: conjunctivae/corneas clear. PERRL, EOM's intact.   Neck: supple, no adenopathy, no carotid bruit   Lungs: clear to auscultation bilaterally   Heart: regular rate and rhythm, S1, S2 normal, no murmur, no JVD or pedal edema   Abdomen: soft, non-tender, non-distended, bowel sounds present, no organomegaly   Extremities: limited movement;lt ankle with a bandage   Pulses: 2+ and symmetric   Neurologic: Alert and oriented X 3, normal strength and tone. Normal symmetric reflexes. Normal coordination and gait     Intake and Output:  Current Shift:  04/09 0700 - 04/09 1859  In: 720 [P.O.:720]  Out: -   Last three shifts:  04/07 1900 - 04/09 0659  In: 2750 [P.O.:250; I.V.:2500]  Out: 825 [Urine:800]    Lab/Data Reviewed:  No results found for this or any previous visit (from the past 24 hour(s)).    Medications Reviewed    Assessment/Plan     Principal Problem:    Fracture of ankle, bimalleolar, left, closed (03/23/2013)    Active Problems:    Dislocation of ankle, closed (03/23/2013)      Essential hypertension, benign (03/23/2013)      Anxiety state, unspecified (03/23/2013)    Plan:  Replace K  Cont meds  Home when okay with Dr. Jackquline Berlin, MD

## 2013-03-24 NOTE — Progress Notes (Signed)
Pt. In bed trying to take soft cast/post op dsg. off leg; Instructed patient that the dsg. was to be left in place; She states that "it's not on there right". Re-assured patient that it was applied correctly and that it needed to be left on. Patient stated she wanted to go outside and smoke then; Risks in leaving the floor were discussed with the patient; Stated she understood them but was still going. Taken out in a wheelchair by husband.

## 2013-03-24 NOTE — Other (Signed)
TRANSFER - OUT REPORT:    Verbal report given to Sarah on Amber [name]  being transferred to 409 for routine progression of care       Report consisted of patient???s Situation, Background, Assessment and   Recommendations(SBAR).     Information from the following report(s) SBAR, Kardex, OR Summary, Procedure Summary, Intake/Output and MAR was reviewed with the receiving nurse.    Opportunity for questions and clarification was provided.      Ok to transfer to floor per anesthesia. No personal belongings available.

## 2013-03-24 NOTE — Progress Notes (Signed)
Physical Therapy Initial Evaluation:    Orders reviewed, chart reviewed, and initial evaluation completed on Amber [name].    Chief complaint: tib/fib fx with ORIF  Date of onset: 03/22/13  Pre-Morbid functionality: lives at home    Patient presents at an overall level of assistance of minimum assistance with basic functional mobility. Results of the evaluation consist of: Patient supine in bed with active ankle pumps and negative Homan's sign.  Patient required min asst for supine to sit and sit to stand at walker.  Patient ambulated 3 steps to bedside chair with walker and min asst.  Patient up in good condition.  NWB      Pain Screen  Pain Scale 1: Numeric (0 - 10)  Pain Intensity 1: 10  Pain Onset 1: post fall  Pain Location 1: Ankle  Pain Orientation 1: Left  Pain Description 1: Aching  Pain Intervention(s) 1: Medication (see MAR)  Patient Stated Pain Goal: 2  Pain Reassessment 1: Patient sleeping        Based on this evaluation, the patient will benefit from physicial therapy service twice daily for duration of hospital stay toward the achievement of set goals. Patient demonstrates the following rehabilitation potential towards the goals: very likely to progress    Goals: improve functional mobility, improve strength, increased bed mobility and increased gait    Treatment Plan: bed mobility, gait training, therapeutic exercise/strengthening and transfer training    The goals and plan of care have been discussed with the patient and spouse. The patient and spouse agree to work toward stated goal(s). Projected outcome possibility isfunctional with modifications to lifestyle/living situation.    Thanks for the consult.  Therapist: Heath Gold, PT

## 2013-03-25 LAB — METABOLIC PANEL, COMPREHENSIVE
A-G Ratio: 0.9 — ABNORMAL LOW (ref 1.2–2.2)
ALT (SGPT): 16 U/L (ref 12–78)
AST (SGOT): 19 U/L (ref 15–37)
Albumin: 2.8 g/dL — ABNORMAL LOW (ref 3.4–5.0)
Alk. phosphatase: 79 U/L (ref 50–136)
Anion gap: 5 mmol/L — ABNORMAL LOW (ref 6–15)
BUN/Creatinine ratio: 11 (ref 7–25)
BUN: 8 MG/DL (ref 7–18)
Bilirubin, total: 0.4 MG/DL (ref <1.1)
CO2: 29 mmol/L (ref 21–32)
Calcium: 7.8 MG/DL — ABNORMAL LOW (ref 8.5–10.1)
Chloride: 107 mmol/L (ref 98–107)
Creatinine: 0.7 MG/DL (ref 0.60–1.30)
GFR est AA: >60 mL/min/{1.73_m2} (ref >60)
GFR est non-AA: >60 mL/min/{1.73_m2} (ref >60)
Globulin: 3.2 g/dL (ref 2.4–3.5)
Glucose: 85 mg/dL (ref 70–110)
Potassium: 3.6 mmol/L (ref 3.5–5.3)
Protein, total: 6 g/dL — ABNORMAL LOW (ref 6.4–8.2)
Sodium: 141 mmol/L (ref 136–145)

## 2013-03-25 LAB — CBC WITH AUTOMATED DIFF
ABS. BASOPHILS: 0 10*3/uL (ref 0.0–0.1)
ABS. EOSINOPHILS: 0 10*3/uL (ref 0.0–0.5)
ABS. LYMPHOCYTES: 1.6 10*3/uL (ref 0.8–3.5)
ABS. MONOCYTES: 0.6 10*3/uL — ABNORMAL LOW (ref 0.8–3.5)
ABS. NEUTROPHILS: 3.3 10*3/uL (ref 1.5–8.0)
BASOPHILS: 0 % (ref 0–2)
EOSINOPHILS: 0 % (ref 0–5)
HCT: 27.8 % — ABNORMAL LOW (ref 41–53)
HGB: 9.6 g/dL — ABNORMAL LOW (ref 12.0–16.0)
LYMPHOCYTES: 29 % (ref 19–48)
MCH: 31.7 PG — ABNORMAL HIGH (ref 27–31)
MCHC: 34.5 g/dL (ref 31–37)
MCV: 91.7 FL (ref 80–100)
MONOCYTES: 10 % — ABNORMAL HIGH (ref 3–9)
MPV: 9.2 FL (ref 5.9–10.3)
NEUTROPHILS: 61 % (ref 40–74)
NRBC: 0 PER 100 WBC
PLATELET: 128 10*3/uL — ABNORMAL LOW (ref 130–400)
RBC: 3.03 M/uL — ABNORMAL LOW (ref 4.2–5.4)
RDW: 12.9 % (ref 11.5–14.5)
WBC: 5.4 10*3/uL (ref 4.5–10.8)

## 2013-03-25 MED ORDER — HYDROCODONE-ACETAMINOPHEN 7.5 MG-325 MG TAB
ORAL_TABLET | Freq: Four times a day (QID) | ORAL | Status: DC | PRN
Start: 2013-03-25 — End: 2013-04-21

## 2013-03-25 NOTE — Progress Notes (Signed)
Call received from Chere at [facility] stating they delivered walker to pt's room this AM and for pt to notify them once she gets home and they will deliver her bsc.  D/W pt who agrees to do so.  Pt to be discharged today.  Resolved.

## 2013-03-25 NOTE — Discharge Summary (Signed)
Discharge Summary      Patient: Amber [name]               Sex: female            MRN: [id number]      Date of Birth:  Nov 01, 1966      Age:  47 y.o.        Nani Skillern, MD            Admit Date: 03/22/2013    Discharge Date: 03/25/2013    Admission Diagnoses: Displaced bimalleolar fracture of left lower leg  Left ankle bimalleolar fracture    Discharge Diagnoses:    Problem List as of 03/25/2013 Date Reviewed: 12/23/2011        ICD-9-CM Class Noted - Resolved    Hypokalemia 276.8  03/24/2013 - Present        *Fracture of ankle, bimalleolar, left, closed 824.4  03/23/2013 - Present        Essential hypertension, benign 401.1  03/23/2013 - Present        Anxiety state, unspecified 300.00  03/23/2013 - Present        RESOLVED: Dislocation of ankle, closed 837.0  03/23/2013 - 03/24/2013              Discharge Condition: Fair    Brief HPI & Hospital Course:   Pt fell in the yard chasing a dog and had a bi-maleolar fracture of the lt ankle with dislocation. Pt was admitted and then Dr. Orma Flaming was consulted who did open reduction and fixation. Pt tolerated the procedure well. Dr. Orma Flaming advised to discharge pt and follow with him as outpatient.   Pt had hypokalemia that was replaced. She will follow up with me in 1 month.    Consults: Orthopedics    Discharge Medications:   Cannot display discharge medications since this patient is not currently admitted.      Activity: activity as advised by Ortho  Diet: Regular Diet    Wound Care: As directed    Follow-up: Nani Skillern, MD in 1 week    Pertinent Pending Laboratory / Radiologic Studies:    Recent Results (from the past 24 hour(s))   METABOLIC PANEL, COMPREHENSIVE    Collection Time     03/25/13  2:55 AM       Result Value Range    Sodium 141  136 - 145 mmol/L    Potassium 3.6  3.5 - 5.3 mmol/L    Chloride 107  98 - 107 mmol/L    CO2 29  21 - 32 mmol/L    Anion gap 5 (*) 6 - 15 mmol/L    Glucose 85  70 - 110 mg/dL    BUN 8  7 - 18 MG/DL    Creatinine 5.62  1.30 - 1.30 MG/DL     BUN/Creatinine ratio 11  7 - 25      GFR est AA >60  >60 ml/min/1.27m2    GFR est non-AA >60  >60 ml/min/1.63m2    Calcium 7.8 (*) 8.5 - 10.1 MG/DL    Bilirubin, total 0.4  <1.1 MG/DL    ALT 16  12 - 78 U/L    AST 19  15 - 37 U/L    Alk. phosphatase 79  50 - 136 U/L    Protein, total 6.0 (*) 6.4 - 8.2 g/dL    Albumin 2.8 (*) 3.4 - 5.0 g/dL    Globulin 3.2  2.4 - 3.5 g/dL  A-G Ratio 0.9 (*) 1.2 - 2.2     CBC WITH AUTOMATED DIFF    Collection Time     03/25/13  2:55 AM       Result Value Range    WBC 5.4  4.5 - 10.8 K/uL    RBC 3.03 (*) 4.2 - 5.4 M/uL    HGB 9.6 (*) 12.0 - 16.0 g/dL    HCT 16.1 (*) 41 - 53 %    MCV 91.7  80 - 100 FL    MCH 31.7 (*) 27 - 31 PG    MCHC 34.5  31 - 37 g/dL    RDW 09.6  04.5 - 40.9 %    PLATELET 128 (*) 130 - 400 K/uL    MPV 9.2  5.9 - 10.3 FL    NEUTROPHILS 61  40 - 74 %    LYMPHOCYTES 29  19 - 48 %    MONOCYTES 10 (*) 3 - 9 %    EOSINOPHILS 0  0 - 5 %    BASOPHILS 0  0 - 2 %    ABS. NEUTROPHILS 3.3  1.5 - 8.0 K/UL    ABS. LYMPHOCYTES 1.6  0.8 - 3.5 K/UL    ABS. MONOCYTES 0.6 (*) 0.8 - 3.5 K/UL    ABS. EOSINOPHILS 0.0  0.0 - 0.5 K/UL    ABS. BASOPHILS 0.0  0.0 - 0.1 K/UL    DF AUTOMATED      NRBC 0.0  0 PER 100 WBC          Physical Exam on the Day of Discharge:    Feeling well.   OBJECTIVE:   The patient appears well, alert, in no distress.   BP 100/54   Pulse 83   Temp(Src) 98.1 ??F (36.7 ??C)   Resp 16   Ht 5\' 5"  (1.651 m)   Wt 148 lb   BMI 24.63 kg/m2   SpO2 99%   LMP 07/27/2010  ENT: normal.    Neck: supple. No adenopathy or thyromegaly. PERLA.   Lungs: clear, good air entry.   Cardiovascular:S1 and S2.   Abdomen: soft without tenderness, guarding, mass or organomegaly.     Neurological: Grossly nonfocal     Total time spent in discharge day management: >30 minutes.    Nani Skillern, MD

## 2013-03-25 NOTE — Progress Notes (Signed)
Verbal shift change report given to Seth,RN (oncoming nurse) by Sarah,RN (offgoing nurse).  Report given with Kardex, Procedure Summary, MAR and Recent Results.

## 2013-03-25 NOTE — Progress Notes (Signed)
Pt sitting at EOB. Sit to stand I . Gait training with SW 40 feet NWB L. Gait steady. Verbally reviewed scooting up steps for safety d/t NWB status. Up in chair. Reclined ex a/aarom SLR, hip ab/add, toe wiggles. Call light in reach. L le elevated on 2 pillows. Call light in reach.

## 2013-04-21 MED ORDER — RANOLAZINE SR 500 MG 12 HR TAB
500 mg | ORAL_TABLET | Freq: Two times a day (BID) | ORAL | Status: DC
Start: 2013-04-21 — End: 2014-05-18

## 2013-04-21 MED ORDER — NITROGLYCERIN 0.4 MG SUBLINGUAL TAB
0.4 mg | SUBLINGUAL | Status: DC | PRN
Start: 2013-04-21 — End: 2014-12-29

## 2013-04-21 NOTE — Patient Instructions (Signed)
MyChart Activation    Thank you for requesting access to MyChart. Please follow the instructions below to securely access and download your online medical record. MyChart allows you to send messages to your doctor, view your test results, renew your prescriptions, schedule appointments, and more.    How Do I Sign Up?    1. In your internet browser, go to [email or web address]  2. Click on the First Time User? Click Here link in the Sign In box. You will be redirect to the New Member Sign Up page.  3. Enter your MyChart Access Code exactly as it appears below. You will not need to use this code after you???ve completed the sign-up process. If you do not sign up before the expiration date, you must request a new code.    MyChart Access Code: [email or web address]  Expires: 06/23/2013 10:59 AM (This is the date your MyChart access code will expire)    4. Enter the last four digits of your Social Security Number (xxxx) and Date of Birth (mm/dd/yyyy) as indicated and click Submit. You will be taken to the next sign-up page.  5. Create a MyChart ID. This will be your MyChart login ID and cannot be changed, so think of one that is secure and easy to remember.  6. Create a MyChart password. You can change your password at any time.  7. Enter your Password Reset Question and Answer. This can be used at a later time if you forget your password.   8. Enter your e-mail address. You will receive e-mail notification when new information is available in MyChart.  9. Click Sign Up. You can now view and download portions of your medical record.  10. Click the Download Summary menu link to download a portable copy of your medical information.    Additional Information    If you have questions, please visit the Frequently Asked Questions section of the MyChart website at [email or web address] Remember, MyChart is NOT to be used for urgent needs. For medical emergencies, dial 911.

## 2013-04-21 NOTE — Progress Notes (Addendum)
Patient: Amber [name]               Sex: female          Enc Date:  04/21/2013        Date of Birth:  Nov 06, 1966      Age:  47 y.o.           HPI:     Amber [name] is a 47 y.o. female who presents with history of HTN.  She has recently broken her left foot.  Has been having some chest pain and was given NTG and relieved the chest pain.  She states she only had to take one.  She describes chest pain as burning sensation.  Shortness of breath with pain. Pain located mid chest and under left breast and wanted to go up her neck.     Brother is Fonnie Jarvis with CAD  Father with CAD      Chief Complaint   Patient presents with   ??? Hypertension     EKG done today, Stress 03/23/10, Holter 03/15/10   ??? Diabetes         Past Medical History   Diagnosis Date   ??? Neuropathy, diabetic    ??? IBS (irritable bowel syndrome)    ??? OSA (obstructive sleep apnea)    ??? GERD (gastroesophageal reflux disease)    ??? Psychiatric disorder      "nervousness"   ??? Migraine    ??? Hypercholesteremia    ??? Unspecified sleep apnea      C-PAP   ??? Diabetes      Borderline   ??? HTN (hypertension)    ??? Thyroid disease      thyroidectomy   ??? Seizures      2012   ??? Difficult intubation      1992   ??? Unspecified adverse effect of anesthesia      prolonged awakening once.   ??? Chronic pain      hx of LBP and LESI's   ??? Other ill-defined conditions 03-22-13     13.8, K 3.3, creat 0.7   ??? Other ill-defined conditions Apr 2011     myoview: normal   ??? Other ill-defined conditions Feb 2013     EKG: NSR   ??? Other ill-defined conditions      high cholesterol       Past Surgical History   Procedure Laterality Date   ??? Delivery c-section     ??? Hx cholecystectomy       lap chole   ??? Hx tubal ligation       lap hysterectomy   ??? Hx other surgical       thyroidectomy april 2012   ??? Hx other surgical       C/section with stillborn - under general anesthesia   ??? Hx gi       colonoscopy, EGD       Family History   Problem Relation Age of Onset   ??? Arthritis-osteo Mother    ???  Cancer Mother    ??? Migraines Mother    ??? Headache Mother    ??? Heart Disease Mother    ??? Heart Disease Father    ??? Asthma Sister    ??? Lung Disease Brother    ??? Arthritis-osteo Maternal Grandmother    ??? Cancer Maternal Grandmother    ??? Diabetes Maternal Grandmother    ??? Hypertension Maternal Grandmother    ??? Stroke Maternal Grandmother    ???  Hypertension Maternal Grandfather    ??? Alcohol abuse Neg Hx    ??? Bleeding Prob Neg Hx    ??? Elevated Lipids Neg Hx    ??? Psychiatric Disorder Neg Hx    ??? Mental Retardation Neg Hx        History     Social History   ??? Marital Status: MARRIED     Spouse Name: N/A     Number of Children: N/A   ??? Years of Education: N/A     Social History Main Topics   ??? Smoking status: Current Every Day Smoker -- 1.00 packs/day for 27 years   ??? Smokeless tobacco: Never Used   ??? Alcohol Use: No   ??? Drug Use: 6.00 per week     Special: Marijuana      Comment: vicodin or lortab   ??? Sexually Active: Yes -- Female partner(s)     Birth Control/ Protection: Surgical     Other Topics Concern   ??? Not on file     Social History Narrative   ??? No narrative on file       Current Outpatient Prescriptions   Medication Sig Dispense Refill   ??? nebivolol (BYSTOLIC) 5 mg tablet Take  by mouth daily.       ??? conjugated estrogens (PREMARIN) 1.25 mg tablet Take  by mouth.       ??? esomeprazole (NEXIUM) 40 mg capsule Take 40 mg by mouth daily. Indications: GASTROESOPHAGEAL REFLUX       ??? atorvastatin (LIPITOR) 40 mg tablet Take 40 mg by mouth daily.         ??? hydrOXYzine (VISTARIL) 25 mg capsule Take 25 mg by mouth three (3) times daily as needed.         ??? losartan-hydrochlorothiazide (HYZAAR) 100-12.5 mg per tablet Take 1 Tab by mouth daily.  30 Tab  5   ??? levothyroxine (SYNTHROID) 125 mcg tablet Take 150 mcg by mouth Daily (before breakfast).       ??? alprazolam (XANAX) 1 mg tablet Take 1 mg by mouth four (4) times daily.       ??? PROMETHAZINE HCL (PHENERGAN PO) Take 25 mg by mouth daily as needed.            Allergies    Allergen Reactions   ??? Topamax (Topiramate) Anaphylaxis   ??? Ultram (Tramadol) Itching   ??? Nasal Spray (Sodium Chloride) Other (comments)     Migraine nasal spray makes her throat bleed   ??? Aspirin Other (comments)     Swelling "knot on side of neck"   ??? Ciprofloxacin Other (comments)     Causes facial redness     ??? Norvasc (Amlodipine) Hives   ??? Levsin (Hyoscyamine Sulfate) Other (comments)     Blurred vision     ??? Pcn (Penicillins) Nausea and Vomiting       Review of Systems- positives are in BOLD  Constitutional: negative for fevers, sweats, fatigue and weight loss  Head: negative for headache, tenderness, dizziness, syncope  Eyes: negative for visual field changes  Respiratory: positive for shortness of breath   Cardiovascular: positive for chest pain  Gastrointestinal: negative for dyspepsia, nausea, vomiting, melena and abdominal pain  Genitourinary:negative for hematuria  Musculoskeletal:positive for leg pain  Endocrine: negative for polydipsia, polyuria  Hematologic/Lymphatic: negative for easy bruising and lymphadenopathy  Neurological: negative for headaches, vertigo and weakness  Psychological: negative for depression, anxiety or sleep disturbance      Physical Exam:  BP 139/90   Pulse 81   Resp 20   Ht 5\' 5"  (1.651 m)   Wt 144 lb (65.318 kg)   BMI 23.96 kg/m2   SpO2 95%   LMP 07/27/2010     General: Alert and oriented. No acute distress.   Head NC/AT   Eyes Pink Conjunctiva anicteric sclera, reactive pupils   Neck: No Thyromegaly JVD   Teeth: No evidence of dental caries, moist, pink mucosa  Lungs: Clear to auscultation bilaterally.   Heart: Regular rate and rhythm, no murmur   Abdomen: Soft, non-tender. Non distended   Extremities: No edema or wounds noted  Skin:  Warm, dry and intact.  No rashes noted      Labs Reviewed:  Admission on 03/22/2013, Discharged on 03/25/2013   Component Date Value Range Status   ??? WBC 03/22/2013 9.6  4.5 - 10.8 K/uL Final   ??? RBC 03/22/2013 4.25  4.2 - 5.4 M/uL Final    ??? HGB 03/22/2013 13.8  12.0 - 16.0 g/dL Final   ??? HCT 64/40/3474 38.4* 41 - 53 % Final   ??? MCV 03/22/2013 90.4  80 - 100 FL Final   ??? MCH 03/22/2013 32.5* 27 - 31 PG Final   ??? MCHC 03/22/2013 35.9  31 - 37 g/dL Final   ??? RDW 25/95/6387 12.6  11.5 - 14.5 % Final   ??? PLATELET 03/22/2013 195  130 - 400 K/uL Final   ??? MPV 03/22/2013 10.5* 5.9 - 10.3 FL Final   ??? NEUTROPHILS 03/22/2013 67  40 - 74 % Final   ??? LYMPHOCYTES 03/22/2013 28  19 - 48 % Final   ??? MONOCYTES 03/22/2013 5  3 - 9 % Final   ??? EOSINOPHILS 03/22/2013 0  0 - 5 % Final   ??? BASOPHILS 03/22/2013 0  0 - 2 % Final   ??? ABS. NEUTROPHILS 03/22/2013 6.4  1.5 - 8.0 K/UL Final   ??? ABS. LYMPHOCYTES 03/22/2013 2.7  0.8 - 3.5 K/UL Final   ??? ABS. MONOCYTES 03/22/2013 0.5* 0.8 - 3.5 K/UL Final   ??? ABS. EOSINOPHILS 03/22/2013 0.0  0.0 - 0.5 K/UL Final   ??? ABS. BASOPHILS 03/22/2013 0.0  0.0 - 0.1 K/UL Final   ??? DF 03/22/2013 AUTOMATED   Final   ??? Sodium 03/22/2013 139  136 - 145 mmol/L Final   ??? Potassium 03/22/2013 3.3* 3.5 - 5.3 mmol/L Final   ??? Chloride 03/22/2013 102  98 - 107 mmol/L Final   ??? CO2 03/22/2013 27  21 - 32 mmol/L Final   ??? Anion gap 03/22/2013 10  6 - 15 mmol/L Final   ??? Glucose 03/22/2013 76  70 - 110 mg/dL Final   ??? BUN 56/43/3295 9  7 - 18 MG/DL Final   ??? Creatinine 03/22/2013 0.70  0.60 - 1.30 MG/DL Final   ??? BUN/Creatinine ratio 03/22/2013 13  7 - 25   Final   ??? GFR est AA 03/22/2013 >60  >60 ml/min/1.27m2 Final   ??? GFR est non-AA 03/22/2013 >60  >60 ml/min/1.18m2 Final    Comment: Estimated GFR is calculated using the                           Modification of Diet Renal Disease(MDRD)                           Study Equation,reported for both African  Americans and non-African Americans,and                           normalized to 1.60m2 body surface area.                           The physician must decide which value                           applies to the patient.The MDRD study                           equation should  only be used in individuals                           age 62 or older.It has not been validated                           for the following:individuals over 70,                           pregnant women,patients with serious                           comorbid conditions,or on certain                           medications,or persons with extremes                           of body size,muscle mass,or nutritional                           status   ??? Calcium 03/22/2013 9.5  8.5 - 10.1 MG/DL Final   ??? MRSA by PCR, Nasal 03/22/2013 NEGATIVE    Final    Comment: A positive test result is presumptive for the presence of living and/or                            nonviable MRSA DNA.    It does not necessarily indicate an active infection.                             Results from the PCR MRSA assay should be interpreted in conjunction with                            other laboratory and clinical data available.   ??? Sodium 03/25/2013 141  136 - 145 mmol/L Final   ??? Potassium 03/25/2013 3.6  3.5 - 5.3 mmol/L Final   ??? Chloride 03/25/2013 107  98 - 107 mmol/L Final   ??? CO2 03/25/2013 29  21 - 32 mmol/L Final   ??? Anion gap 03/25/2013 5* 6 - 15 mmol/L Final   ??? Glucose 03/25/2013 85  70 - 110 mg/dL Final   ??? BUN 16/09/9603 8  7 - 18 MG/DL Final   ??? Creatinine 03/25/2013 0.70  0.60 - 1.30 MG/DL Final   ???  BUN/Creatinine ratio 03/25/2013 11  7 - 25   Final   ??? GFR est AA 03/25/2013 >60  >60 ml/min/1.74m2 Final   ??? GFR est non-AA 03/25/2013 >60  >60 ml/min/1.36m2 Final    Comment: Estimated GFR is calculated using the                           Modification of Diet Renal Disease(MDRD)                           Study Equation,reported for both African                           Americans and non-African Americans,and                           normalized to 1.68m2 body surface area.                           The physician must decide which value                           applies to the patient.The MDRD study                            equation should only be used in individuals                           age 7 or older.It has not been validated                           for the following:individuals over 70,                           pregnant women,patients with serious                           comorbid conditions,or on certain                           medications,or persons with extremes                           of body size,muscle mass,or nutritional                           status   ??? Calcium 03/25/2013 7.8* 8.5 - 10.1 MG/DL Final   ??? Bilirubin, total 03/25/2013 0.4  <1.1 MG/DL Final   ??? ALT 09/60/4540 16  12 - 78 U/L Final   ??? AST 03/25/2013 19  15 - 37 U/L Final   ??? Alk. phosphatase 03/25/2013 79  50 - 136 U/L Final   ??? Protein, total 03/25/2013 6.0* 6.4 - 8.2 g/dL Final   ??? Albumin 98/10/9146 2.8* 3.4 - 5.0 g/dL Final   ??? Globulin 82/95/6213 3.2  2.4 - 3.5 g/dL Final   ??? A-G Ratio 03/25/2013 0.9* 1.2 - 2.2   Final   ??? WBC 03/25/2013 5.4  4.5 - 10.8 K/uL Final   ???  RBC 03/25/2013 3.03* 4.2 - 5.4 M/uL Final   ??? HGB 03/25/2013 9.6* 12.0 - 16.0 g/dL Final    Comment: RESULTS CHECKED WITH PREVIOUS RESULTS                           THE ABOVE RESULT REFLECTS A SIGNIFICANT                           CHANGE FROM THE PREVIOUSLY REPORTED                            VALUE (DELTA FAILURE). IF THIS RESULT                            APPEARS QUESTIONABLE AS RELATED TO THE                           PATIENT'S CLINICAL FINDINGS, CONFIRMATION                           BY TESTING OF A NEWLY COLLECTED                           SPECIMEN SHOULD BE CONSIDERED                           RESULT CALLED TO READ BACK BY                           [id number] GAIL RN CLG   ??? HCT 03/25/2013 27.8* 41 - 53 % Final   ??? MCV 03/25/2013 91.7  80 - 100 FL Final   ??? MCH 03/25/2013 31.7* 27 - 31 PG Final   ??? MCHC 03/25/2013 34.5  31 - 37 g/dL Final   ??? RDW 47/82/9562 12.9  11.5 - 14.5 % Final   ??? PLATELET 03/25/2013 128* 130 - 400 K/uL Final   ??? MPV 03/25/2013 9.2  5.9 - 10.3 FL  Final   ??? NEUTROPHILS 03/25/2013 61  40 - 74 % Final   ??? LYMPHOCYTES 03/25/2013 29  19 - 48 % Final   ??? MONOCYTES 03/25/2013 10* 3 - 9 % Final   ??? EOSINOPHILS 03/25/2013 0  0 - 5 % Final   ??? BASOPHILS 03/25/2013 0  0 - 2 % Final   ??? ABS. NEUTROPHILS 03/25/2013 3.3  1.5 - 8.0 K/UL Final   ??? ABS. LYMPHOCYTES 03/25/2013 1.6  0.8 - 3.5 K/UL Final   ??? ABS. MONOCYTES 03/25/2013 0.6* 0.8 - 3.5 K/UL Final   ??? ABS. EOSINOPHILS 03/25/2013 0.0  0.0 - 0.5 K/UL Final   ??? ABS. BASOPHILS 03/25/2013 0.0  0.0 - 0.1 K/UL Final   ??? DF 03/25/2013 AUTOMATED   Final   ??? NRBC 03/25/2013 0.0  0 PER 100 WBC Final         Imaging and Procedure:  Results for orders placed in visit on 12/23/11   AMB POC EKG ROUTINE W/ 12 LEADS, INTER & REP     Status: Normal    Impression:     EKG NSR       Cardiac Studies  Last ECG results  Sinus  Rhythm   Low voltage in precordial leads.    -RSR(V1) -nondiagnostic.   ABNORMAL  Rate 67      Assessment/Plan     Patient Active Problem List   Diagnosis Code   ??? Fracture of ankle, bimalleolar, left, closed 824.4   ??? Essential hypertension, benign 401.1   ??? Anxiety state, unspecified 300.00   ??? Hypokalemia 276.8        the following changes in treatment are made: .  Stress test  Next week chemical due to leg injury  ECHO as well for heart function  NTG SL   Ranexa 500 mg po BID for chest pain  On BB and ARB will continue these  RTC in 1 month and sooner if necessary       Delorse Lek, APRN  11:30 AM

## 2013-05-07 NOTE — Telephone Encounter (Signed)
Pt schedules for stress and echo test June 6 at 8am. Left message for pt to call office back.

## 2013-05-23 NOTE — ED Notes (Signed)
Patient signed in requesting an ankle x-ray and to have staples removed from previous surgery done on 03/23/13 to repair trimalleolar fracture of right ankle by Dr. Sharmon Leyden.

## 2013-07-31 LAB — COMPLIANCE DRUG SCREEN/PRESCRIPTION MONITORING: Note: 0

## 2013-08-06 ENCOUNTER — Inpatient Hospital Stay
Admit: 2013-08-06 | Discharge: 2013-08-07 | Disposition: A | Discharge status: Home / Self Care | Payer: BLUE CROSS/BLUE SHIELD | Attending: Psychiatry | Admitting: Psychiatry

## 2013-08-06 DIAGNOSIS — F3289 Other specified depressive episodes: Secondary | ICD-10-CM

## 2013-08-06 LAB — CBC WITH AUTOMATED DIFF
ABS. BASOPHILS: 0 10*3/uL (ref 0.0–0.1)
ABS. EOSINOPHILS: 0 10*3/uL (ref 0.0–0.5)
ABS. LYMPHOCYTES: 3.3 10*3/uL (ref 0.8–3.5)
ABS. MONOCYTES: 0.4 10*3/uL — ABNORMAL LOW (ref 0.8–3.5)
ABS. NEUTROPHILS: 3.9 10*3/uL (ref 1.5–8.0)
BASOPHILS: 0 % (ref 0–2)
EOSINOPHILS: 0 % (ref 0–5)
HCT: 36.1 % — ABNORMAL LOW (ref 41–53)
HGB: 12.8 g/dL (ref 12.0–16.0)
LYMPHOCYTES: 43 % (ref 19–48)
MCH: 32.5 PG — ABNORMAL HIGH (ref 27–31)
MCHC: 35.5 g/dL (ref 31–37)
MCV: 91.6 FL (ref 80–100)
MONOCYTES: 6 % (ref 3–9)
MPV: 9.2 FL (ref 5.9–10.3)
NEUTROPHILS: 51 % (ref 40–74)
PLATELET: 157 10*3/uL (ref 130–400)
RBC: 3.94 M/uL — ABNORMAL LOW (ref 4.2–5.4)
RDW: 12.6 % (ref 11.5–14.5)
WBC: 7.6 10*3/uL (ref 4.5–10.8)

## 2013-08-06 LAB — METABOLIC PANEL, COMPREHENSIVE
A-G Ratio: 1 — ABNORMAL LOW (ref 1.2–2.2)
ALT (SGPT): 16 U/L (ref 12–78)
AST (SGOT): 12 U/L — ABNORMAL LOW (ref 15–37)
Albumin: 3.6 g/dL (ref 3.4–5.0)
Alk. phosphatase: 85 U/L (ref 50–136)
Anion gap: 9 mmol/L (ref 6–15)
BUN/Creatinine ratio: 13 (ref 7–25)
BUN: 12 MG/DL (ref 7–18)
Bilirubin, total: 0.4 MG/DL (ref <1.1)
CO2: 27 mmol/L (ref 21–32)
Calcium: 8.9 MG/DL (ref 8.5–10.1)
Chloride: 106 mmol/L (ref 98–107)
Creatinine: 0.9 MG/DL (ref 0.60–1.30)
GFR est AA: >60 mL/min/{1.73_m2} (ref >60)
GFR est non-AA: >60 mL/min/{1.73_m2} (ref >60)
Globulin: 3.5 g/dL (ref 2.4–3.5)
Glucose: 96 mg/dL (ref 70–110)
Potassium: 3.4 mmol/L — ABNORMAL LOW (ref 3.5–5.3)
Protein, total: 7.1 g/dL (ref 6.4–8.2)
Sodium: 142 mmol/L (ref 136–145)

## 2013-08-06 LAB — DRUG SCREEN, URINE
AMPHETAMINES: NEGATIVE
BARBITURATES: NEGATIVE
BENZODIAZEPINES: POSITIVE
COCAINE: NEGATIVE
METHADONE: NEGATIVE
OPIATES: POSITIVE
PCP(PHENCYCLIDINE): NEGATIVE
THC (TH-CANNABINOL): POSITIVE
TRICYCLICS: NEGATIVE

## 2013-08-06 LAB — ETHYL ALCOHOL: ALCOHOL(ETHYL),SERUM: <10 MG/DL

## 2013-08-06 LAB — THYROID PANEL W/TSH
Free thyroxine index: 6 — ABNORMAL HIGH (ref 1.4–5.2)
T3 Uptake: 34 % (ref 31–39)
T4, Total: 17.5 ug/dL — ABNORMAL HIGH (ref 4.7–13.3)
TSH: 0.71 u[IU]/mL (ref 0.35–3.74)

## 2013-08-06 LAB — HCG QL SERUM: HCG, Ql.: NEGATIVE

## 2013-08-06 MED ORDER — TRAZODONE 150 MG TAB
150 mg | Freq: Every evening | ORAL | Status: DC | PRN
Start: 2013-08-06 — End: 2013-08-06

## 2013-08-06 MED ORDER — HYDROXYZINE PAMOATE 50 MG CAP
50 mg | Freq: Four times a day (QID) | ORAL | Status: DC | PRN
Start: 2013-08-06 — End: 2013-08-07
  Administered 2013-08-06 – 2013-08-07 (×2): via ORAL

## 2013-08-06 MED ORDER — NEBIVOLOL 5 MG TAB
5 mg | Freq: Every day | ORAL | Status: DC
Start: 2013-08-06 — End: 2013-08-06

## 2013-08-06 MED ORDER — DULOXETINE 20 MG CAP, DELAYED RELEASE
20 mg | Freq: Every day | ORAL | Status: DC
Start: 2013-08-06 — End: 2013-08-07
  Administered 2013-08-06 – 2013-08-07 (×2): via ORAL

## 2013-08-06 MED ORDER — MULTIVITAMIN TAB
Freq: Every day | ORAL | Status: DC
Start: 2013-08-06 — End: 2013-08-07
  Administered 2013-08-06 – 2013-08-07 (×2): via ORAL

## 2013-08-06 MED ORDER — TIZANIDINE 4 MG TAB
4 mg | Freq: Every evening | ORAL | Status: DC
Start: 2013-08-06 — End: 2013-08-07
  Administered 2013-08-07: 01:00:00 via ORAL

## 2013-08-06 MED ORDER — MAGNESIUM HYDROXIDE 2,400 MG/10 ML ORAL SUSP
2400 mg/10 mL | Freq: Four times a day (QID) | ORAL | Status: DC | PRN
Start: 2013-08-06 — End: 2013-08-07

## 2013-08-06 MED ORDER — RANOLAZINE SR 500 MG 12 HR TAB
500 mg | Freq: Two times a day (BID) | ORAL | Status: DC
Start: 2013-08-06 — End: 2013-08-07
  Administered 2013-08-07 (×2): via ORAL

## 2013-08-06 MED ORDER — NICOTINE 10 MG INHALATION CARTRIDGE
10 mg | RESPIRATORY_TRACT | Status: DC | PRN
Start: 2013-08-06 — End: 2013-08-07
  Administered 2013-08-06: 12:00:00 via RESPIRATORY_TRACT

## 2013-08-06 MED ORDER — ATORVASTATIN 40 MG TAB
40 mg | Freq: Every evening | ORAL | Status: DC
Start: 2013-08-06 — End: 2013-08-07
  Administered 2013-08-07: 01:00:00 via ORAL

## 2013-08-06 MED ORDER — ALUM-MAG HYDROXIDE-SIMETH 400 MG-400 MG-40 MG/5 ML ORAL SUSP
400-400-40 mg/5 mL | ORAL | Status: DC | PRN
Start: 2013-08-06 — End: 2013-08-07

## 2013-08-06 MED ORDER — ALPRAZOLAM 1 MG TAB
1 mg | Freq: Four times a day (QID) | ORAL | Status: DC
Start: 2013-08-06 — End: 2013-08-07
  Administered 2013-08-06 – 2013-08-07 (×4): via ORAL

## 2013-08-06 MED ORDER — CONJUGATED ESTROGENS 0.625 MG TAB
0.625 mg | Freq: Every day | ORAL | Status: DC
Start: 2013-08-06 — End: 2013-08-07
  Administered 2013-08-07: 13:00:00 via ORAL

## 2013-08-06 MED ORDER — LOSARTAN 50 MG TAB
50 mg | Freq: Every day | ORAL | Status: DC
Start: 2013-08-06 — End: 2013-08-06

## 2013-08-06 MED ORDER — NEBIVOLOL 5 MG TAB
5 mg | Freq: Every day | ORAL | Status: DC
Start: 2013-08-06 — End: 2013-08-07
  Administered 2013-08-06: 21:00:00 via ORAL

## 2013-08-06 MED ORDER — HYDROCHLOROTHIAZIDE 25 MG TAB
25 mg | Freq: Every day | ORAL | Status: DC
Start: 2013-08-06 — End: 2013-08-07
  Administered 2013-08-07: 13:00:00 via ORAL
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
## 2013-08-06 NOTE — Other (Signed)
Pt attended group on the topic of Substance Abuse and Nutrition. Pt noted to be attentive to video.  Video provided education designed to increase understanding of ways substance abuse affects nutrition and why nutrition is important for recovery.  10:50am-11:25am    Amber [name]
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
## 2013-08-06 NOTE — Behavioral Health Treatment Team (Signed)
Pt in lounge with others, AAOx3. Reported mild anxiety, denies depression. Denies SI, HI, and hallucinations. Stated, "Sometimes I have a temper but I try to take the high road." Denies further wants or needs at this time. Will continue to monitor.

## 2013-08-06 NOTE — ED Provider Notes (Signed)
Patient is a 47 y.o. female presenting with mental health disorder. The history is provided by the patient.   Mental Health Problem   This is a new problem. The current episode started less than 1 hour ago. The problem has not changed since onset.Pertinent negatives include no confusion, no seizures, no weakness, no agitation, no hallucinations and no self-injury. Associated symptoms comments: Homicidal  . Mental status baseline is normal.  Risk factors include the patient not taking meds correctly. Past medical history comments: snn.        Past Medical History   Diagnosis Date   ??? Neuropathy, diabetic    ??? IBS (irritable bowel syndrome)    ??? OSA (obstructive sleep apnea)    ??? GERD (gastroesophageal reflux disease)    ??? Psychiatric disorder      "nervousness"   ??? Migraine    ??? Hypercholesteremia    ??? Unspecified sleep apnea      C-PAP   ??? HTN (hypertension)    ??? Thyroid disease      thyroidectomy   ??? Seizures      2012   ??? Difficult intubation      1992   ??? Unspecified adverse effect of anesthesia      prolonged awakening once.   ??? Chronic pain      hx of LBP and LESI's   ??? Other ill-defined conditions 03-22-13     13.8, K 3.3, creat 0.7   ??? Other ill-defined conditions Apr 2011     myoview: normal   ??? Other ill-defined conditions Feb 2013     EKG: NSR   ??? Other ill-defined conditions      high cholesterol        Past Surgical History   Procedure Laterality Date   ??? Delivery c-section     ??? Hx cholecystectomy       lap chole   ??? Hx tubal ligation       lap hysterectomy   ??? Hx other surgical       thyroidectomy april 2012   ??? Hx other surgical       C/section with stillborn - under general anesthesia   ??? Hx gi       colonoscopy, EGD   ??? Hx orthopaedic       trimalleolar fx repair 03/23/13         Family History   Problem Relation Age of Onset   ??? Arthritis-osteo Mother    ??? Cancer Mother    ??? Migraines Mother    ??? Headache Mother    ??? Heart Disease Mother    ??? Heart Disease Father    ??? Asthma Sister    ??? Lung Disease  Brother    ??? Arthritis-osteo Maternal Grandmother    ??? Cancer Maternal Grandmother    ??? Diabetes Maternal Grandmother    ??? Hypertension Maternal Grandmother    ??? Stroke Maternal Grandmother    ??? Hypertension Maternal Grandfather    ??? Alcohol abuse Neg Hx    ??? Bleeding Prob Neg Hx    ??? Elevated Lipids Neg Hx    ??? Psychiatric Disorder Neg Hx    ??? Mental Retardation Neg Hx         History     Social History   ??? Marital Status: MARRIED     Spouse Name: N/A     Number of Children: N/A   ??? Years of Education: N/A     Occupational History   ???  Not on file.     Social History Main Topics   ??? Smoking status: Current Every Day Smoker -- 2.00 packs/day for 27 years   ??? Smokeless tobacco: Never Used   ??? Alcohol Use: No   ??? Drug Use: 6.00 per week     Special: Marijuana      Comment: vicodin or lortab   ??? Sexually Active: Yes -- Female partner(s)     Birth Control/ Protection: Surgical     Other Topics Concern   ??? Not on file     Social History Narrative   ??? No narrative on file                  ALLERGIES: Topamax; Ultram; Nasal spray; Aspirin; Ciprofloxacin; Norvasc; Levsin; and Pcn      Review of Systems   Constitutional: Negative.  Negative for chills, activity change and appetite change.   HENT: Negative.  Negative for hearing loss, sore throat, facial swelling, neck pain and neck stiffness.    Eyes: Negative.  Negative for pain, discharge, redness and itching.   Respiratory: Negative.  Negative for cough, chest tightness, shortness of breath and wheezing.    Cardiovascular: Negative.  Negative for chest pain.   Gastrointestinal: Negative.  Negative for nausea, vomiting, abdominal pain, diarrhea and abdominal distention.   Genitourinary: Negative.  Negative for dysuria, urgency, frequency and flank pain.   Musculoskeletal: Negative.  Negative for myalgias, back pain and arthralgias.   Skin: Negative.  Negative for color change, rash and wound.   Neurological: Negative.  Negative for dizziness, seizures, weakness,  light-headedness and headaches.   Hematological: Negative for adenopathy.   Psychiatric/Behavioral: Negative.  Negative for suicidal ideas, hallucinations, confusion, self-injury and agitation.   All other systems reviewed and are negative.        Filed Vitals:    08/06/13 0426   BP: 130/81   Pulse: 55   Temp: 98 ??F (36.7 ??C)   Resp: 22   Height: 5\' 4"  (1.626 m)   Weight: 58.968 kg (130 lb)   SpO2: 99%            Physical Exam   Nursing note and vitals reviewed.  Constitutional: She is oriented to person, place, and time. She appears well-developed and well-nourished.   HENT:   Head: Normocephalic and atraumatic.   Right Ear: External ear normal.   Left Ear: External ear normal.   Nose: Nose normal.   Eyes: Conjunctivae and EOM are normal. Pupils are equal, round, and reactive to light.   Neck: Normal range of motion. Neck supple.   Cardiovascular: Normal rate, regular rhythm and normal heart sounds.    Pulmonary/Chest: Effort normal and breath sounds normal. No respiratory distress.   Abdominal: Soft. Bowel sounds are normal. She exhibits no distension. There is no tenderness. There is no rebound and no guarding.   Musculoskeletal: Normal range of motion. She exhibits no edema.   Neurological: She is alert and oriented to person, place, and time.   Skin: Skin is warm and dry. No rash noted. No erythema.   Psychiatric: She has a normal mood and affect. Her behavior is normal.        MDM     Amount and/or Complexity of Data Reviewed:   Clinical lab tests:  Ordered and reviewed  Tests in the medicine section of the CPT??:  Reviewed and ordered  Discussion of test results with the performing providers:  No   Decide to obtain previous  medical records or to obtain history from someone other than the patient:  Yes   Obtain history from someone other than the patient:  Yes   Review and summarize past medical records:  Yes   Discuss the patient with another provider:  No  Risk of Significant Complications, Morbidity, and/or  Mortality:   Presenting problems:  Moderate  Diagnostic procedures:  Moderate  Management options:  Moderate  Progress:   Patient progress:  Stable      Procedures
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
## 2013-08-06 NOTE — ED Notes (Signed)
Pt. Not cooperative.  Combative and swinging at staff.  Refuses to give urine sample.  Refuses to speak to MD.  Refuses to change into admission scrubs.  Guard at bedside.

## 2013-08-06 NOTE — Other (Signed)
Conducted Activity Therapy Assessment with pt. Information provided by patient. Leisure Interests include social activities and solitary activities.  Possible Leisure Barriers include motivation. Pt did agree to attend daily Leisure group activities and leisure exploration with Activities Therapist.    Lance B. Sydnor

## 2013-08-06 NOTE — Other (Signed)
TRANSFER - OUT REPORT:    Verbal report given to amy, rn(name) on Amber [name]  being transferred to behavioral health center(unit) for routine progression of care       Report consisted of patient???s Situation, Background, Assessment and   Recommendations(SBAR).     Information from the following report(s) Kardex, ED Summary and MAR was reviewed with the receiving nurse.    Opportunity for questions and clarification was provided.

## 2013-08-06 NOTE — Other (Signed)
[facility]  Group Note  [address]  [place], [place]  [address]  RONNETT PULLIN  [birth year]    Date of service: 08/06/2013    Start time: 1517  Stop time:  1600 (Patient arrived to group late and left group early)    Type of session: Psychotherapy    Problem number: 1    Short term goal (STG): Patient will identify one negative coping strategy.    Intervention/techniques: Listened/Empathized, Queired/Probed, Promoted Peer Support, Facilitated Disclosure and Provided Feedback    Patient mental status/affect: Anxious and Labile    Patient behavior/appearance: Attentive and Cooperative    Special patient treatment accommodations provided (describe): None    Patient response and progress towards goals: Patient participated in group session. Patient met goal as evidenced by identification of the following negative coping strategy: aggression, excessive spending      AMY L SMITH    Clinician Signature:_____________________________________________    Clinician PRINTED name & Credential______________________________    Date:________________Time:________________

## 2013-08-06 NOTE — Behavioral Health Treatment Team (Signed)
Verbal shift change report given to AMY LYNN MILLER, RN (oncoming nurse) by Tory Emerald (offgoing nurse). Report included the following information SBAR.     TRANSFER - IN REPORT:    Verbal report received from Amy,RN on BH(name) on Amber [name]  being received from ER(unit) for routine progression of care      Report consisted of patient???s Situation, Background, Assessment and   Recommendations(SBAR).     Information from the following report(s) SBAR was reviewed with the receiving nurse.    Opportunity for questions and clarification was provided.      Assessment completed upon patient???s arrival to unit and care assumed. (Pt arrived at shift change.)
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
## 2013-08-07 MED ORDER — MULTIVITAMIN TAB
ORAL_TABLET | Freq: Every day | ORAL | Status: AC
Start: 2013-08-07 — End: 2013-11-05

## 2013-08-07 MED ORDER — DULOXETINE 20 MG CAP, DELAYED RELEASE
20 mg | ORAL_CAPSULE | Freq: Every day | ORAL | Status: DC
Start: 2013-08-07 — End: 2013-09-15

## 2013-08-07 MED ORDER — HYDROXYZINE PAMOATE 50 MG CAP
50 mg | ORAL_CAPSULE | Freq: Four times a day (QID) | ORAL | Status: AC | PRN
Start: 2013-08-07 — End: 2013-09-06

## 2013-08-07 MED ORDER — ALPRAZOLAM 1 MG TAB
1 mg | ORAL_TABLET | Freq: Four times a day (QID) | ORAL | Status: AC
Start: 2013-08-07 — End: 2013-09-06

## 2013-08-07 MED ORDER — LEVOTHYROXINE 25 MCG TAB
25 mcg | Freq: Every day | ORAL | Status: DC
Start: 2013-08-07 — End: 2013-08-07
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
## 2013-08-07 NOTE — Behavioral Health Treatment Team (Signed)
Discharge instructions explained. Pt verbalized understanding.

## 2013-08-07 NOTE — Other (Signed)
[facility]   Group Note   [address]   [place], [place] [address]   Nonda Lou   06/12/1984   Date of service: 08/07/2013   Start time: 1128  Stop time: 1200  Type of session: Psychotherapy   Short term goal (STG): Discussion re: Assertiveness and Confidence. Pt's processed own experiences and did role play activity to practice assertiveness.   Intervention/techniques: Informed, Validated/Supported, Guided, Prompted/Cued, Listened/Empathized, Promoted Peer Support, Facilitated Disclosure and Provided Feedback   Patient mental status/affect: Calm  Patient behavior/appearance: Neatly Groomed, Attentive, Cooperative and Motivated   Special patient treatment accommodations provided (describe): None   Patient response and progress towards goals: Pt. Participated fully sharing thoughts and interacting appropriately with co members.     Pricilla Larsson, LPCA  Clinician Signature:_____________________________________________   Clinician PRINTED name & Credential______________________________   Date:________________Time:________________

## 2013-08-07 NOTE — Behavioral Health Treatment Team (Signed)
Alert. Oriented x 4. Reports sleeping well. Denies SI/HI/AVH. Cooperative. C/O anxiety-rates 7/10. Denies-depression, Generalized pain-rates 8/10.  Medicated per MAR. Assessment ongoing. Will continue to monitor.

## 2013-08-07 NOTE — Behavioral Health Treatment Team (Signed)
Discharge instructions, prescriptions and belongings given to patient. Ambulated off the unit in good condition, escorted by staff.
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
## 2013-08-07 NOTE — Other (Signed)
Group Therapy Note: Pt. Did not come for group.

## 2013-08-07 NOTE — Behavioral Health Treatment Team (Signed)
Pt in bed with eyes closed. Respirations even and unlabored. No s/s distress. Will continue to monitor.
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
## 2013-08-07 NOTE — Other (Signed)
Case management Note: Discussed aftercare and follow up. Pt. Signed release for [facility] for follow up care and tx. Pt additionally signed a release for her PCP Dr. Lorina Rabon.

## 2013-08-07 NOTE — Other (Signed)
Case Management Note: Attempt to make follow up appts w/ [facility]. Office is closed. Attempt follow up w/ PCP Dr. Lorina Rabon. Answering services direct to call back Monday

## 2013-09-13 ENCOUNTER — Inpatient Hospital Stay
Admit: 2013-09-13 | Discharge: 2013-09-15 | Disposition: A | Discharge status: Home / Self Care | Payer: BLUE CROSS/BLUE SHIELD | Attending: Psychiatry | Admitting: Psychiatry

## 2013-09-13 DIAGNOSIS — F3289 Other specified depressive episodes: Secondary | ICD-10-CM

## 2013-09-13 LAB — URINALYSIS W/ RFLX MICROSCOPIC
Blood: NEGATIVE
Glucose: NEGATIVE mg/dL
Ketone: NEGATIVE mg/dL
Leukocyte Esterase: NEGATIVE
Nitrites: NEGATIVE
Specific gravity: >1.03 — ABNORMAL HIGH (ref 1.002–1.030)
Urobilinogen: 1 EU/dL (ref 0–1)
pH (UA): 6 (ref 4.5–8.0)

## 2013-09-13 LAB — CBC WITH AUTOMATED DIFF
ABS. BASOPHILS: 0 10*3/uL (ref 0.0–0.1)
ABS. EOSINOPHILS: 0 10*3/uL (ref 0.0–0.5)
ABS. LYMPHOCYTES: 3 10*3/uL (ref 0.8–3.5)
ABS. MONOCYTES: 0.4 10*3/uL — ABNORMAL LOW (ref 0.8–3.5)
ABS. NEUTROPHILS: 3.8 10*3/uL (ref 1.5–8.0)
BASOPHILS: 0 % (ref 0–2)
EOSINOPHILS: 0 % (ref 0–5)
HCT: 38 % — ABNORMAL LOW (ref 41–53)
HGB: 13.2 g/dL (ref 12.0–16.0)
LYMPHOCYTES: 41 % (ref 19–48)
MCH: 31.3 PG — ABNORMAL HIGH (ref 27–31)
MCHC: 34.7 g/dL (ref 31–37)
MCV: 90 FL (ref 80–100)
MONOCYTES: 6 % (ref 3–9)
MPV: 9.2 FL (ref 5.9–10.3)
NEUTROPHILS: 53 % (ref 40–74)
PLATELET: 141 10*3/uL (ref 130–400)
RBC: 4.22 M/uL (ref 4.2–5.4)
RDW: 12.3 % (ref 11.5–14.5)
WBC: 7.3 10*3/uL (ref 4.5–10.8)

## 2013-09-13 LAB — METABOLIC PANEL, COMPREHENSIVE
A-G Ratio: 1.1 — ABNORMAL LOW (ref 1.2–2.2)
ALT (SGPT): 18 U/L (ref 12–78)
AST (SGOT): 15 U/L (ref 15–37)
Albumin: 3.8 g/dL (ref 3.4–5.0)
Alk. phosphatase: 94 U/L (ref 50–136)
Anion gap: 5 mmol/L — ABNORMAL LOW (ref 6–15)
BUN/Creatinine ratio: 20 (ref 7–25)
BUN: 14 MG/DL (ref 7–18)
Bilirubin, total: 0.8 MG/DL (ref <1.1)
CO2: 28 mmol/L (ref 21–32)
Calcium: 8.2 MG/DL — ABNORMAL LOW (ref 8.5–10.1)
Chloride: 105 mmol/L (ref 98–107)
Creatinine: 0.7 MG/DL (ref 0.60–1.30)
GFR est AA: >60 mL/min/{1.73_m2} (ref >60)
GFR est non-AA: >60 mL/min/{1.73_m2} (ref >60)
Globulin: 3.5 g/dL (ref 2.4–3.5)
Glucose: 84 mg/dL (ref 70–110)
Potassium: 3.4 mmol/L — ABNORMAL LOW (ref 3.5–5.3)
Protein, total: 7.3 g/dL (ref 6.4–8.2)
Sodium: 138 mmol/L (ref 136–145)

## 2013-09-13 LAB — DRUG SCREEN, URINE
AMPHETAMINES: NEGATIVE
BARBITURATES: NEGATIVE
BENZODIAZEPINES: POSITIVE
COCAINE: NEGATIVE
METHADONE: NEGATIVE
OPIATES: NEGATIVE
PCP(PHENCYCLIDINE): NEGATIVE
THC (TH-CANNABINOL): NEGATIVE
TRICYCLICS: NEGATIVE

## 2013-09-13 LAB — THYROID PANEL W/TSH
Free thyroxine index: 4.7 (ref 1.4–5.2)
T3 Uptake: 31 % (ref 31–39)
T4, Total: 15.2 ug/dL — ABNORMAL HIGH (ref 4.7–13.3)
TSH: 0.17 u[IU]/mL — ABNORMAL LOW (ref 0.35–3.74)

## 2013-09-13 LAB — URINE MICROSCOPIC

## 2013-09-13 LAB — SALICYLATE: Salicylate level: 3.3 MG/DL (ref 2.8–20.0)

## 2013-09-13 LAB — ACETAMINOPHEN: Acetaminophen level: <2 ug/mL — ABNORMAL LOW (ref 10–30)

## 2013-09-13 LAB — ETHYL ALCOHOL: ALCOHOL(ETHYL),SERUM: NOT DETECTED MG/DL

## 2013-09-13 MED ORDER — RISPERIDONE 0.25 MG TAB
0.25 mg | Freq: Every evening | ORAL | Status: DC
Start: 2013-09-13 — End: 2013-09-14

## 2013-09-13 MED ORDER — NITROGLYCERIN 0.4 MG SUBLINGUAL TAB
0.4 mg | SUBLINGUAL | Status: DC | PRN
Start: 2013-09-13 — End: 2013-09-13

## 2013-09-13 MED ORDER — LOSARTAN 50 MG TAB
50 mg | Freq: Every day | ORAL | Status: DC
Start: 2013-09-13 — End: 2013-09-15
  Administered 2013-09-14 – 2013-09-15 (×2): via ORAL

## 2013-09-13 MED ORDER — LORAZEPAM 0.5 MG TAB
0.5 mg | ORAL | Status: DC | PRN
Start: 2013-09-13 — End: 2013-09-14
  Administered 2013-09-14: 12:00:00 via ORAL

## 2013-09-13 MED ORDER — LEVOTHYROXINE 100 MCG TAB
100 mcg | Freq: Every day | ORAL | Status: DC
Start: 2013-09-13 — End: 2013-09-15
  Administered 2013-09-14 – 2013-09-15 (×2): via ORAL

## 2013-09-13 MED ORDER — ESOMEPRAZOLE MAGNESIUM 40 MG CAP, DELAYED RELEASE
40 mg | Freq: Every day | ORAL | Status: DC
Start: 2013-09-13 — End: 2013-09-15
  Administered 2013-09-14 – 2013-09-15 (×2): via ORAL

## 2013-09-13 MED ORDER — ATORVASTATIN 40 MG TAB
40 mg | Freq: Every day | ORAL | Status: DC
Start: 2013-09-13 — End: 2013-09-15
  Administered 2013-09-15: 01:00:00 via ORAL

## 2013-09-13 MED ORDER — NEBIVOLOL 5 MG TAB
5 mg | Freq: Every day | ORAL | Status: DC
Start: 2013-09-13 — End: 2013-09-15
  Administered 2013-09-14 – 2013-09-15 (×2): via ORAL

## 2013-09-13 MED ORDER — HYDROCHLOROTHIAZIDE 25 MG TAB
25 mg | Freq: Every day | ORAL | Status: DC
Start: 2013-09-13 — End: 2013-09-15
  Administered 2013-09-14 – 2013-09-15 (×2): via ORAL

## 2013-09-13 MED ORDER — DULOXETINE 20 MG CAP, DELAYED RELEASE
20 mg | Freq: Every day | ORAL | Status: DC
Start: 2013-09-13 — End: 2013-09-14
  Administered 2013-09-14: 12:00:00 via ORAL

## 2013-09-13 MED ORDER — MULTIVITAMIN TAB
Freq: Every day | ORAL | Status: DC
Start: 2013-09-13 — End: 2013-09-15
  Administered 2013-09-14 – 2013-09-15 (×2): via ORAL

## 2013-09-13 MED ORDER — NITROGLYCERIN 0.4 MG SUBLINGUAL TAB
0.4 mg | SUBLINGUAL | Status: DC | PRN
Start: 2013-09-13 — End: 2013-09-15

## 2013-09-13 MED ORDER — RANOLAZINE SR 500 MG 12 HR TAB
500 mg | Freq: Two times a day (BID) | ORAL | Status: DC
Start: 2013-09-13 — End: 2013-09-15
  Administered 2013-09-14 – 2013-09-15 (×3): via ORAL

## 2013-09-13 NOTE — ED Notes (Signed)
Pt states she fell on Saturday landing on left hip and hitting right brow on something. PA informed of this finding.

## 2013-09-13 NOTE — Other (Signed)
Therapeutic Recreation Note: Pt declined scheduled rereation group (3:15pm-4:15pm) opting to remain in TV room.

## 2013-09-13 NOTE — ED Notes (Signed)
Lab at bedside for blood draw.
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
## 2013-09-13 NOTE — Behavioral Health Treatment Team (Signed)
Patient admitted to the services of Dr. Daria Pastures for suicidal and homicidal ideation. Alert and oriented X 3. Vitals within normal limits. Complains of left hip pain rated 5/10 and right orbital pain from a fall on Saturday prior to admission. She was wand-ed and brought onto the unit by [place] and [place], MHT's. No contraband found. Assessment complete: pupils equal and reactive. Skin turgor within normal limits. Normal bilateral lung sounds. Heart, normal rhythm and rate. Bowel sounds active. Pedal pulses equal and intact. No edema noted. She was then oriented to the unit and room 107-02. Please see addendum and admission for further details. Orders processing. Will monitor.

## 2013-09-13 NOTE — H&P (Signed)
Already dictated [id number]

## 2013-09-13 NOTE — Other (Signed)
TRANSFER - OUT REPORT:    Verbal report given to Debbie RN(name) on Amber [name]  being transferred to Care Unit(unit) for routine progression of care       Report consisted of patient???s Situation, Background, Assessment and   Recommendations(SBAR).     Information from the following report(s) SBAR, ED Summary and MAR was reviewed with the receiving nurse.    Opportunity for questions and clarification was provided.

## 2013-09-13 NOTE — ED Notes (Signed)
Intake coordinator at bedside- security at bedside.

## 2013-09-13 NOTE — Behavioral Health Treatment Team (Signed)
Shift assessment complete. Pt resting in bed comfortably. No s/s of distress noted. Pt denies any withdrawal complaints. She scored a 2 on the CIWA. Pt rates her anxiety is "up there", and that she is trying to "sleep off" her depression. Pt denies suicidal/ homicidal ideations, as well as any auditory or visual hallucinations. Pt refused bedtime dose of Risperdal, stating "Oh no, that medicine has been recalled for causing boys to get boobies." Attempted to educate pt regarding meds. Will continue to monitor for changes
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
## 2013-09-13 NOTE — ED Notes (Addendum)
Pt to er 10 via ems from Dr Urban Gibson office for further eval and psych eval- pt states was there to get medicine refills and pt states that the doctor's office got a phone call from pt's niece that she was supposedly selling her xanax and pt got upset  And states she is homicidal. Pt states she dont know how she feels right now about wanting to hurt herself. EMS reports [place] [employer] at facility pt was at.

## 2013-09-13 NOTE — Progress Notes (Signed)
Ct head w/o completed 11:25 AM

## 2013-09-13 NOTE — ED Provider Notes (Signed)
I personally saw and examined the patient.  I have reviewed and agree with the MLP's findings, including all diagnostic interpretations, and plans as written.   I was present during the key portions of separately billed procedures.    Cyndra Feinberg H Koen Antilla, MD

## 2013-09-14 MED ORDER — RISPERIDONE 1 MG TAB
1 mg | Freq: Every evening | ORAL | Status: DC
Start: 2013-09-14 — End: 2013-09-15
  Administered 2013-09-15 (×2): via ORAL

## 2013-09-14 MED ORDER — NICOTINE 10 MG INHALATION CARTRIDGE
10 mg | RESPIRATORY_TRACT | Status: DC | PRN
Start: 2013-09-14 — End: 2013-09-15

## 2013-09-14 MED ORDER — DULOXETINE 30 MG CAP, DELAYED RELEASE
30 mg | Freq: Every day | ORAL | Status: DC
Start: 2013-09-14 — End: 2013-09-15
  Administered 2013-09-15: 12:00:00 via ORAL

## 2013-09-14 MED ORDER — ALPRAZOLAM 1 MG TAB
1 mg | Freq: Four times a day (QID) | ORAL | Status: DC
Start: 2013-09-14 — End: 2013-09-15
  Administered 2013-09-15 (×2): via ORAL

## 2013-09-14 NOTE — Other (Signed)
Group Therapy Note: Pt did not attend group therapy. Pt remained in room.

## 2013-09-14 NOTE — Behavioral Health Treatment Team (Signed)
Up and about the unit this morning. Alert and oriented X 3. Vitals within normal limits. Reports sleeping on and off throughout the night. No complaints of pain or discomfort at this time. Assessment complete: pupils equal and reactive. Skin turgor and capillary refill within normal limits. Normal bilateral lung sounds. Heart, normal rate and rhythm. Bowel sounds active. Last bowel movement 2 days ago. Pedal pulses equal and intact. No edema noted.   Anxiety and depression rated 10/10. Denies feelings of SI, HI, A/V hallucinations and paranoia. No wants or needs expressed. Will monitor.

## 2013-09-14 NOTE — Behavioral Health Treatment Team (Signed)
Verbal shift change report given to April, RN (oncoming nurse) by Alicia, RN (offgoing nurse). Report included the following information SBAR.

## 2013-09-14 NOTE — Other (Signed)
DUTY TO WARN NOTE:  Unable to perform duty to warn as pt will not disclose the name of her niece that she was wanting to harm. Pt denies such thoughts this date.

## 2013-09-14 NOTE — Progress Notes (Signed)
Problem: Suicide/Homicide (Adult/Pediatric)  Goal: *STG: Attends activities and groups  Outcome: Not Progressing Towards Goal  Variance: Patient slowly responding      Goal: *STG/LTG: No longer expresses self destructive or suicidal/homicidal thoughts  Outcome: Resolved/Met Date Met:  09/14/13  Pt denies that she presently has thoughts of wanting to harm herself or anyone else  Goal: *LTG: Identifies available community resources  Outcome: Resolved/Met Date Met:  09/14/13  [facility], [facility] ED or BH, [facility] ED or BH, PCP  Goal: *LTG: Develops proactive suicide prevention plan  Outcome: Resolved/Met Date Met:  09/14/13  Stay away from negative influences, follow-up with [facility], play pool, take meds as prescribed, see a counselor on a regular basis.    Problem: Depressed Mood (Adult/Pediatric)  Goal: *STG: Attends activities and groups  Outcome: Not Progressing Towards Goal  Variance: Patient slowly responding      Goal: *STG/LTG: NO LONGER EXPRESSES SELF DESTRUCTIVE OR SUICIDAL/HOMICIDAL THOUGHTS  Outcome: Resolved/Met Date Met:  09/14/13  Pt denies that she presently has thoughts of wanting to harm herself or anyone else    Problem: Anxiety-Behavioral Health (Adult/Pediatric)  Goal: *STG: Attends activities and groups  Outcome: Not Progressing Towards Goal  Variance: Patient slowly responding      Goal: *STG/LTG: Trigger identification  Outcome: Resolved/Met Date Met:  09/14/13  Not sleeping, when husband calls her names

## 2013-09-14 NOTE — Other (Signed)
Start Time: 1019  End Time:  1117    Individual Therapy Note: Met with pt for 1:1. Treatment plans initiated and pt agreed with same. Also completed release of information, aftercare plan, suicide risk, and psychosocial assessment. Pt is able to identify a proactive homicide prevention plan-see risk assessment for further information. Pt denies that she is presently experiencing any thoughts of wanting to harm herself or anyone else. States that she was having thoughts of wanting to harm her niece because she stole her Xanax and then call her PCP office and reported that pt was selling her pills. Dr. Urban Gibson office will no longer write her meds for her. Pt does not wish to return to his office and will arrange PCP follow-up on her own. Pleasant and smiling. Cooperative. Appropriate. Denies SI/HI. Poor insight. No concerns voiced.
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
## 2013-09-14 NOTE — Progress Notes (Signed)
Chart screened for discharge planning needs per Case Management protocol.  Based on the information currently available in medical record, no intervention is required from this department at this time as there are no needs identified. Pt will meet with [facility] therapist for individual and group therapy throughout hospitalization.  CM/CSW available to assist PRN should  needs arise.

## 2013-09-14 NOTE — Behavioral Health Treatment Team (Signed)
Shift assessment complete. Pt up to TV room with other pt's. No s/s of distress noted. Pt rates her anxiety as a 10, and depression as a 9-10. She denies suicidal/ homicidal ideations, as well as any auditory or visual hallucinations. Will continue to monitor for changes

## 2013-09-14 NOTE — Other (Signed)
Session: 1010-1100  Group Therapy Note: Patient did not attend group despite prompting.
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
## 2013-09-14 NOTE — Other (Addendum)
Discharge Planning Note: Pt to follow-up with [facility] on 09/16/13 @ 0930 with Lynette. Pt declines coordination of follow-up services with PCP.

## 2013-09-14 NOTE — Other (Addendum)
Recreational Group Therapy: Pt did not participate in group therapy. Pt remained in bed.    Session Time: 1300-1500

## 2013-09-14 NOTE — Behavioral Health Treatment Team (Signed)
In the lounge, watching TV with others. No complaints, no acute distress noted. All wants and needs addressed throughout the shift. Assessment complete. Report to oncoming RN.

## 2013-09-14 NOTE — Behavioral Health Treatment Team (Signed)
Pt resting in bed comfortably. No s/s of distress noted. All questions and concerns addressed this shift. Will give report to oncoming shift

## 2013-09-14 NOTE — Behavioral Health Treatment Team (Signed)
Patient declined nursing therapy group regarding the effects of addition on family members.

## 2013-09-15 MED ORDER — ALPRAZOLAM 1 MG TAB
1 mg | ORAL_TABLET | Freq: Four times a day (QID) | ORAL | Status: AC
Start: 2013-09-15 — End: 2013-09-22

## 2013-09-15 MED ORDER — RISPERIDONE 1 MG TAB
1 mg | Freq: Every evening | ORAL | Status: DC
Start: 2013-09-15 — End: 2013-09-15

## 2013-09-15 MED ORDER — QUETIAPINE 25 MG TAB
25 mg | Freq: Every evening | ORAL | Status: DC
Start: 2013-09-15 — End: 2013-09-15

## 2013-09-15 MED ORDER — RISPERIDONE 1 MG TAB
1 mg | ORAL_TABLET | Freq: Every evening | ORAL | Status: AC
Start: 2013-09-15 — End: 2013-09-29

## 2013-09-15 MED ORDER — DULOXETINE 30 MG CAP, DELAYED RELEASE
30 mg | ORAL_CAPSULE | Freq: Every day | ORAL | Status: AC
Start: 2013-09-15 — End: 2013-09-29
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
## 2013-09-15 NOTE — Progress Notes (Signed)
Problem: Depressed Mood (Adult/Pediatric)  Goal: *STG: Verbalizes anger, guilt, and other feelings in a constructive manor  Outcome: Resolved/Met Date Met:  09/15/13  Verbalizing same appropriately  Goal: *STG: Demonstrates reduction in symptoms and increase in insight into coping skills/future focused  Outcome: Resolved/Met Date Met:  09/15/13  Reports feeling less depressed and appears to be more insightful  Goal: *LTG: Returns to previous level of functioning and participates with after care plan  Outcome: Resolved/Met Date Met:  09/15/13  Reports feeling like she is back to her baseline level of functioning and pt will follow-up with [facility]  Goal: *LTG: Understands illness and can identify signs of relapse  Outcome: Resolved/Met Date Met:  09/15/13  Pt understands illness. Reports that at the onset of depression, she experiences boredom, extreme anger
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
## 2013-09-15 NOTE — Behavioral Health Treatment Team (Signed)
Pt resting in bed comfortably. No s/s of distress noted. All questions and concerns addressed this shift. Will give report to oncoming shift

## 2013-09-15 NOTE — Behavioral Health Treatment Team (Signed)
Verbal shift change report given to Amy, RN (oncoming nurse) by Alicia, RN (offgoing nurse). Report included the following information SBAR.

## 2013-09-15 NOTE — Other (Signed)
Conducted Activity Therapy Assessment with pt. Information provided by patient. Leisure Interests include social activities and solitary activities.  Possible Leisure Barriers include attitude and motivation. Pt did not agree to attend daily Leisure group activities and leisure exploration with Activities Therapist.    Lance B. Sydnor

## 2013-09-15 NOTE — Discharge Summary (Signed)
Already dictated [id number]    NB    Patient changed her mind and now wants to be on Risperdal to help stabilize her mood. Out patient provider to check patient prolactin level HBA1c,Fasting blood sugar.

## 2013-09-15 NOTE — Other (Signed)
[facility]  Group Note  [address]  [place], [place]  [address]  Amber [name]  1966-12-09    Date of service: 09/15/13    Start time: 1020  Stop time: 1110    Type of session: Psychoeducational    Problem number: 1    Short term goal (STG): Cognitive Distortions and fixing same.    Intervention/techniques: Informed, Validated/Supported, Modeled/Rehearsed, Prompted/Cued, Listened/Empathized, Observed/Monitored and Evaluated/Interpreted    Patient mental status/affect: Calm    Patient behavior/appearance: Neatly Groomed, Attentive and Cooperative    Special patient treatment accommodations provided (describe): none    Patient response and progress towards goals: Appropriate contribution. No concerns voiced.        Herbert Pun    Clinician Signature:_____________________________________________    Clinician PRINTED name & Credential______________________________    Date:________________Time:________________

## 2013-09-15 NOTE — Other (Signed)
Therapeutic Recreation Note: Pt declined scheduled rereation group opting to remain in the TV room.
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
## 2013-09-15 NOTE — Behavioral Health Treatment Team (Signed)
Verbal shift change report given to AMY LYNN MILLER, RN (oncoming nurse) by Alicia,RN (offgoing nurse). Report included the following information SBAR.

## 2013-09-15 NOTE — Behavioral Health Treatment Team (Addendum)
I have reviewed discharge instructions with the patient.  The patient verbalized understanding. Pt signed/received all personal belongings as well as a copy of discharge instructions and #3 paper scripts for following: cymbalta 30 mg PO, take 1 capsule daily, qty 14, no refills, xanax 1 mg PO, four times a day, qty 28, no refills, risperdal 1 mg PO, take 1 tablet by mouth every bedtime, qty 14, no refills, copies of scripts in paper-lite chart. The AVS has been transmitted via fax to the next level of care provider, [facility].Pt exited facility without incident.    AMY Berle Mull, RN

## 2014-03-24 LAB — CBC WITH AUTOMATED DIFF
ABS. BASOPHILS: 0 10*3/uL (ref 0.0–0.1)
ABS. EOSINOPHILS: 0.1 10*3/uL (ref 0.0–0.5)
ABS. LYMPHOCYTES: 3.6 10*3/uL — ABNORMAL HIGH (ref 0.8–3.5)
ABS. MONOCYTES: 0.7 10*3/uL — ABNORMAL LOW (ref 0.8–3.5)
ABS. NEUTROPHILS: 4.7 10*3/uL (ref 1.5–8.0)
BASOPHILS: 0 % (ref 0–2)
EOSINOPHILS: 1 % (ref 0–5)
HCT: 39.9 % — ABNORMAL LOW (ref 41–53)
HGB: 13.5 g/dL (ref 12.0–16.0)
LYMPHOCYTES: 39 % (ref 19–48)
MCH: 31 PG (ref 27–31)
MCHC: 33.8 g/dL (ref 31–37)
MCV: 91.5 FL (ref 80–100)
MONOCYTES: 7 % (ref 3–9)
MPV: 10 FL (ref 5.9–10.3)
NEUTROPHILS: 53 % (ref 40–74)
PLATELET: 223 10*3/uL (ref 130–400)
RBC: 4.36 M/uL (ref 4.2–5.4)
RDW: 12.6 % (ref 11.5–14.5)
WBC: 9.1 10*3/uL (ref 4.5–10.8)

## 2014-03-24 LAB — METABOLIC PANEL, COMPREHENSIVE
A-G Ratio: 0.8 — ABNORMAL LOW (ref 1.2–2.2)
ALT (SGPT): 14 U/L (ref 12–78)
AST (SGOT): 10 U/L — ABNORMAL LOW (ref 15–37)
Albumin: 3.7 g/dL (ref 3.4–5.0)
Alk. phosphatase: 112 U/L (ref 45–117)
Anion gap: 9 mmol/L (ref 6–15)
BUN/Creatinine ratio: 30 — ABNORMAL HIGH (ref 7–25)
BUN: 18 MG/DL (ref 7–18)
Bilirubin, total: 0.2 MG/DL (ref <1.1)
CO2: 26 mmol/L (ref 21–32)
Calcium: 9 MG/DL (ref 8.5–10.1)
Chloride: 105 mmol/L (ref 98–107)
Creatinine: 0.6 MG/DL (ref 0.60–1.30)
GFR est AA: >60 mL/min/{1.73_m2} (ref >60)
GFR est non-AA: >60 mL/min/{1.73_m2} (ref >60)
Globulin: 4.4 g/dL — ABNORMAL HIGH (ref 2.4–3.5)
Glucose: 85 mg/dL (ref 70–110)
Potassium: 4.1 mmol/L (ref 3.5–5.3)
Protein, total: 8.1 g/dL (ref 6.4–8.2)
Sodium: 140 mmol/L (ref 136–145)

## 2014-03-24 LAB — LIPID PANEL
Cholesterol, total: 256 MG/DL — ABNORMAL HIGH (ref <200)
HDL Cholesterol: 44 MG/DL (ref 32–96)
LDL, calculated: 174.72 MG/DL — ABNORMAL HIGH (ref <130)
Triglyceride: 233 MG/DL — ABNORMAL HIGH (ref <150)
VLDL, calculated: 46.6 MG/DL — ABNORMAL HIGH (ref 5–32)

## 2014-03-24 LAB — TSH 3RD GENERATION: TSH: 0.1 u[IU]/mL — ABNORMAL LOW (ref 0.35–3.74)

## 2014-03-24 MED ORDER — ATORVASTATIN 40 MG TAB
40 mg | ORAL_TABLET | Freq: Every day | ORAL | Status: DC
Start: 2014-03-24 — End: 2014-05-02

## 2014-03-24 MED ORDER — LOSARTAN-HYDROCHLOROTHIAZIDE 100 MG-12.5 MG TAB
ORAL_TABLET | Freq: Every day | ORAL | Status: DC
Start: 2014-03-24 — End: 2014-05-02

## 2014-03-24 MED ORDER — ESOMEPRAZOLE MAGNESIUM 40 MG CAP, DELAYED RELEASE
40 mg | ORAL_CAPSULE | Freq: Every day | ORAL | Status: DC
Start: 2014-03-24 — End: 2014-05-02

## 2014-03-24 MED ORDER — BUPROPION XL 150 MG 24 HR TAB
150 mg | ORAL_TABLET | ORAL | Status: DC
Start: 2014-03-24 — End: 2014-05-18

## 2014-03-24 MED ORDER — LEVOTHYROXINE 150 MCG TAB
150 mcg | ORAL_TABLET | Freq: Every day | ORAL | Status: DC
Start: 2014-03-24 — End: 2014-05-02

## 2014-03-24 NOTE — Patient Instructions (Signed)
MyChart Activation    Thank you for requesting access to MyChart. Please follow the instructions below to securely access and download your online medical record. MyChart allows you to send messages to your doctor, view your test results, renew your prescriptions, schedule appointments, and more.    How Do I Sign Up?    1. In your internet browser, go to [email or web address]  2. Click on the First Time User? Click Here link in the Sign In box. You will be redirect to the New Member Sign Up page.  3. Enter your MyChart Access Code exactly as it appears below. You will not need to use this code after you???ve completed the sign-up process. If you do not sign up before the expiration date, you must request a new code.    MyChart Access Code: [email or web address]  Expires: 06/22/2014  8:27 AM (This is the date your MyChart access code will expire)    4. Enter the last four digits of your Social Security Number (xxxx) and Date of Birth (mm/dd/yyyy) as indicated and click Submit. You will be taken to the next sign-up page.  5. Create a MyChart ID. This will be your MyChart login ID and cannot be changed, so think of one that is secure and easy to remember.  6. Create a MyChart password. You can change your password at any time.  7. Enter your Password Reset Question and Answer. This can be used at a later time if you forget your password.   8. Enter your e-mail address. You will receive e-mail notification when new information is available in MyChart.  9. Click Sign Up. You can now view and download portions of your medical record.  10. Click the Download Summary menu link to download a portable copy of your medical information.    Additional Information    If you have questions, please visit the Frequently Asked Questions section of the MyChart website at [email or web address] Remember, MyChart is NOT to be used for urgent needs. For medical emergencies, dial 911.    [facility]   Office working hours are Monday - Friday 8 am to 6 pm.   Office phone number is [phone] and fax is [phone].  For non-emergent medical care and clinical advice during office hours:   1. Call office or   2.  Send message or request by MyChart  For non-emergent medical care and clinical advice after office hours:  1.  Call [phone] or  2.  Send message or request by MyChart  Emergency care can be obtained at the [facility] ER, Urgent Care or calling 911.    Patient Satisfaction Survey  We appreciate you giving us your e-mail address.  Please watch for our patient satisfaction survey which you will receive by e-mail.  We strive to provide you with the best care possible.  We respect all comments and will take comments into consideration to improve our service.  Thank you for your participation.

## 2014-03-24 NOTE — Progress Notes (Signed)
I reviewed the patient's medical history, the resident's findings on physical examination, the patient's diagnoses, and treatment plan as documented in the resident note.  I concur with the treatment plan as documented.  Additional suggestions noted.
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
## 2014-03-30 MED ORDER — ALBUTEROL SULFATE 2.5 MG/0.5 ML NEB SOLUTION
2.5 mg/0.5 mL | RESPIRATORY_TRACT | Status: AC
Start: 2014-03-30 — End: 2014-03-30
  Administered 2014-03-30: 18:00:00 via RESPIRATORY_TRACT

## 2014-03-30 MED ORDER — SODIUM CHLORIDE 0.9 % NEB SOLUTION
0.9 % | RESPIRATORY_TRACT | Status: DC | PRN
Start: 2014-03-30 — End: 2014-04-03
  Administered 2014-03-30: 18:00:00 via RESPIRATORY_TRACT

## 2014-03-30 MED FILL — SODIUM CHLORIDE 0.9 % NEB SOLUTION: 0.9 % | RESPIRATORY_TRACT | Qty: 3

## 2014-03-30 MED FILL — ALBUTEROL SULFATE 2.5 MG/0.5 ML NEB SOLUTION: 2.5 mg/0.5 mL | RESPIRATORY_TRACT | Qty: 0.5

## 2014-04-05 NOTE — Progress Notes (Signed)
Faxed [facility] into [facility] attn mindy

## 2014-05-03 MED ORDER — ESOMEPRAZOLE MAGNESIUM 40 MG CAP, DELAYED RELEASE
40 mg | ORAL_CAPSULE | Freq: Every day | ORAL | Status: DC
Start: 2014-05-03 — End: 2014-07-20

## 2014-05-03 MED ORDER — LOSARTAN-HYDROCHLOROTHIAZIDE 100 MG-12.5 MG TAB
ORAL_TABLET | Freq: Every day | ORAL | Status: DC
Start: 2014-05-03 — End: 2014-07-20

## 2014-05-03 MED ORDER — LEVOTHYROXINE 150 MCG TAB
150 mcg | ORAL_TABLET | Freq: Every day | ORAL | Status: DC
Start: 2014-05-03 — End: 2015-01-11

## 2014-05-03 MED ORDER — ATORVASTATIN 40 MG TAB
40 mg | ORAL_TABLET | Freq: Every day | ORAL | Status: DC
Start: 2014-05-03 — End: 2014-05-18

## 2014-05-18 MED ORDER — ATORVASTATIN 80 MG TAB
80 mg | ORAL_TABLET | Freq: Every day | ORAL | Status: DC
Start: 2014-05-18 — End: 2014-07-20
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
## 2014-05-18 NOTE — Patient Instructions (Signed)
MyChart Activation    Thank you for requesting access to MyChart. Please follow the instructions below to securely access and download your online medical record. MyChart allows you to send messages to your doctor, view your test results, renew your prescriptions, schedule appointments, and more.    How Do I Sign Up?    1. In your internet browser, go to [email or web address]  2. Click on the First Time User? Click Here link in the Sign In box. You will be redirect to the New Member Sign Up page.  3. Enter your MyChart Access Code exactly as it appears below. You will not need to use this code after you???ve completed the sign-up process. If you do not sign up before the expiration date, you must request a new code.    MyChart Access Code: [email or web address]  Expires: 06/22/2014  8:27 AM (This is the date your MyChart access code will expire)    4. Enter the last four digits of your Social Security Number (xxxx) and Date of Birth (mm/dd/yyyy) as indicated and click Submit. You will be taken to the next sign-up page.  5. Create a MyChart ID. This will be your MyChart login ID and cannot be changed, so think of one that is secure and easy to remember.  6. Create a MyChart password. You can change your password at any time.  7. Enter your Password Reset Question and Answer. This can be used at a later time if you forget your password.   8. Enter your e-mail address. You will receive e-mail notification when new information is available in MyChart.  9. Click Sign Up. You can now view and download portions of your medical record.  10. Click the Download Summary menu link to download a portable copy of your medical information.    Additional Information    If you have questions, please visit the Frequently Asked Questions section of the MyChart website at [email or web address] Remember, MyChart is NOT to be used for urgent needs. For medical emergencies, dial 911.    [facility]   Office working hours are Monday - Friday 8 am to 6 pm.   Office phone number is [phone] and fax is [phone].  For non-emergent medical care and clinical advice during office hours:   1. Call office or   2.  Send message or request by MyChart  For non-emergent medical care and clinical advice after office hours:  1.  Call [phone] or  2.  Send message or request by MyChart  Emergency care can be obtained at the [facility], Urgent Care or calling 911.    Patient Satisfaction Survey  We appreciate you giving [place] your e-mail address.  Please watch for our patient satisfaction survey which you will receive by e-mail.  We strive to provide you with the best care possible.  We respect all comments and will take comments into consideration to improve our service.  Thank you for your participation.

## 2014-05-18 NOTE — Progress Notes (Signed)
I reviewed the patient's medical history, the resident's findings on physical examination, the patient's diagnoses, and treatment plan as documented in the resident note.  I concur with the treatment plan as documented.  Additional suggestions noted.

## 2014-05-19 LAB — TSH 3RD GENERATION: TSH: 0.16 u[IU]/mL — ABNORMAL LOW (ref 0.35–3.74)

## 2014-06-16 NOTE — Progress Notes (Signed)
I reviewed the patient's medical history, the resident's findings on physical examination, the patient's diagnoses, and treatment plan as documented in the resident note.  I concur with the treatment plan as documented.  Additional suggestions noted.

## 2014-06-16 NOTE — Progress Notes (Signed)
[facility]      Name: Amber EisenmengerDebra Sue [name]     Age: 48 y.o.     Sex: female    Chief Complaint   Patient presents with   ??? Follow-up     MRI results, states that she stopped taking some of her meds because she can not swollow them.       HPI: Amber [name] is a/an 48 y.o. female who presents to the clinic for schedule follow up.     Patient had history of anxiety, was xanax before, stating it is the only medication that help with the symptom.  She has been seeing [facility], stating " they have not been helping with my anxiety"  She reports that she has been having raising thought, can not sleep-  Waking up every hour, constant nerve.   She denies any thought of hurting self or others.  She would like to see an psychiatrist so she can get her medications, so she can get her daily function back.      She reports that she stopped taking some of her medications due to trouble swallowing big pills.  Stating she has this problem for a long time.  She also reports having trouble with swallowing solid food such as meat and bread, stating " I feel something get stuck in the back of the throat.  She denies pain with swallowing.  water does not bother her at all.  She saw Dr.  Charlett BlakeWarrior at least 2 years ago.  Had EGD done in the past.        For hypothyroidism, she is currently on synthroid 150 mcg daily.  Last Tsh 0.16 ( 05/18/2014).  She denies symptom of hypo/ hyperthyroidism.  Denies chest pain,shortness of breath, fever, or headache.      Review of Systems:  Negative except as noted in the HPI    Visit Vitals   Item Reading   ??? BP 130/90 mmHg   ??? Pulse 74   ??? Temp(Src) 98.9 ??F (37.2 ??C) (Oral)   ??? Resp 12   ??? Ht 5\' 4"  (1.626 m)   ??? Wt 150 lb (68.04 kg)   ??? BMI 25.73 kg/m2     Physical Exam:  General appearance: alert, cooperative, no distress  Head: Normocephalic, without obvious abnormality, atraumatic  Eyes: pink  conjunctivae/corneas clear.   Lungs: clear to auscultation bilaterally   Heart: regular rate and rhythm, S1, S2 normal, no murmur, no JVD or pedal edema  Abdomen: soft, non-tender, non-distended, bowel sounds present, no organomegaly  Extremities: no edema, redness or tenderness in the calves or thighs  Skin: Skin color, texture, turgor normal. No rashes or lesions  Neurologic: Alert and oriented X 3  Imaging: MRI lumbar showed MULTILEVEL DISC DEGENERATIVE CHANGES. FINDINGS AGAIN MOST PRONOUNCED  AT THE L4-5 DISC RESULTING IN MODERATE CANAL STENOSIS      Assessment and Plan:    Amber [name] was seen today for follow-up.    Diagnoses and associated orders for this visit:    COPD (chronic obstructive pulmonary disease) (HCC)  Stable.   Smoking Cessation     Anxiety state, unspecified  - REFERRAL TO PSYCHIATRY    Hypothyroidism due to acquired atrophy of thyroid  - TSH, 3RD GENERATION; Future    Tobacco abuse  -   Smoking Cessation.    Hyperlipidemia  -    Continue current treatment    Lumbar pain with radiation down left leg  - REFERRAL TO  PHYSICAL THERAPY    Swallowing difficulty  - REFERRAL TO GASTROENTEROLOGY    Follow Up:  One month.     Dorann Ouhuy Arnika Larzelere, DO    Risk and benefits of medications as well as alternative therapies discussed with patient who voiced understanding and agrees with treatment plan.

## 2014-06-21 NOTE — Progress Notes (Signed)
Referral sent to [email or web address]

## 2014-06-22 NOTE — Telephone Encounter (Signed)
Patient states was referred to Dr Elesa Hacker. Does not want to see him. Wants to know if we can refer to someone else?

## 2014-06-22 NOTE — Telephone Encounter (Signed)
Who does patient want to see
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
## 2014-06-27 NOTE — Progress Notes (Addendum)
Physical Therapy Daily Treatment Note    Patient Information  Amber [name]   [birth year]  [id number]     Referring Physician: Tillie FantasiaMcCoy, Lori D, DO   Medical Diagnosis:LBP with radiculopathy  Date of onset:  2 years  Date of Surgery:  N/A for this Diagnosis    DATE :06/28/2014  Visit 2 of 8-12 specified in original POC at time of Initial Evaluation.    Subjective:   Patient reports pain in low back for 2 years, reports she feels tight in hips and legs.     Pain rating before treatment: 4-5/10    Pain rating after treatment: 3-4/10    Summary List:  Patient denies any new medical diagnoses or changes in medical conditions since last visit.  Patient denies any operations or procedures since last visit.  Patient denies any new adverse or allergic drug reactions since last visit.  Patient denies any changes to medication list (herbals, prescription, & OTC).     Objective:   Please refer to today's Treatment Flowsheet & or below listed activities for exercises / interventions performed today. These addressed any or all of the following: education / ROM / strength / flexibility / tissue healing / tissue mobility / joint mobility / edema control / pain control / postural awareness ( which are necessary to achieve pt's LTFG's).      Active Stretches: gastroc  Passive Stretches:hamstring, piriformis and psoas      Patient educated on continuing compliance with HEP.       Assessment:   Amber [name] is a 48 y.o. female who presents with symptoms consistent with LBP with radiculopathy. Less painful after today's session.           Plan:   Plan to continue to see 2-3 x /week per the POC for education, therapeutic exercise, manual therapy prn progressing as patient tolerates focusing on increasing reducing deficits in order to improve overall function.      Re-certification due on or before 07/25/14 (re-assessment required on or before 07/25/14 per [place] law)      Danielle Dessracy Marcelle Hepner, PTA    Therapeutic Exercise (Timed Code): 30 minutes   Total Billable Time: 30 minutes

## 2014-06-30 NOTE — Progress Notes (Signed)
Cancelled due to pain.

## 2014-07-01 NOTE — Telephone Encounter (Signed)
Can we check on referral please

## 2014-07-01 NOTE — Telephone Encounter (Signed)
Patient called requesting information on her referral to psychiatry.

## 2014-07-04 NOTE — Progress Notes (Signed)
Physical Therapy Daily Treatment Note    Patient Information  Amber [name]   [birth year]  [id number]     Referring Physician: Tillie Fantasia, DO   Medical Diagnosis:LBP with radiculopathy  Date of onset:  2 years  Date of Surgery:  N/A for this Diagnosis    DATE :07/04/2014  Visit 3 of 8-12 specified in original POC at time of Initial Evaluation.    Subjective:   Patient reports she was very sore after last treatment and her pain has not changed.     Pain rating before treatment: 7/10    Pain rating after treatment: 7-8/10    Summary List:  Patient denies any new medical diagnoses or changes in medical conditions since last visit.  Patient denies any operations or procedures since last visit.  Patient denies any new adverse or allergic drug reactions since last visit.  Patient denies any changes to medication list (herbals, prescription, & OTC).     Objective:   Please refer to today's Treatment Flowsheet & or below listed activities for exercises / interventions performed today. These addressed any or all of the following: education / ROM / strength / flexibility / tissue healing / tissue mobility / joint mobility / edema control / pain control / postural awareness ( which are necessary to achieve pt's LTFG's).      Active Stretches: gastroc  Passive Stretches:hamstring, piriformis and psoas to increase ROM, pt unable to perform independently.     Patient educated on continuing compliance with HEP.     Assessment:   Elleen Coulibaly is a 48 y.o. female who presents with symptoms consistent with LBP with radiculopathy. Pt exhibits decreased/impaired ROM/ flexibility/ strength/ posture and increased pain leading to altered function. Pt tolerated today's session with increased pain with most activities.       Plan:   Plan to continue to see 2-3 x /week per the POC for education, therapeutic exercise, manual therapy prn progressing as patient tolerates focusing on  increasing reducing deficits in order to improve overall function.      Re-certification due on or before 07/25/14 (re-assessment required on or before 07/25/14 per [place] law)      Lyla Son, PTA    Therapeutic Exercise (Timed Code): 30 minutes  Total Billable Time: 30 minutes

## 2014-07-14 NOTE — Progress Notes (Signed)
Physical Therapy Daily Treatment Note    Patient Information  Amber [name]   1966/02/24  [id number]     Referring Physician: Tillie Fantasia, DO   Medical Diagnosis:LBP with radiculopathy  Date of onset:  2 years  Date of Surgery:  N/A for this Diagnosis    DATE :07/14/2014  Visit 4 of 8-12 specified in original POC at time of Initial Evaluation.    Subjective:   Patient reports she was very sore afterex.  Feels her back pain is getting worse.  She goes back to the dr next week.  Pain rating before treatment: 4/10    Pain rating after treatment: 5/10    Summary List:  Patient denies any new medical diagnoses or changes in medical conditions since last visit.  Patient denies any operations or procedures since last visit.  Patient denies any new adverse or allergic drug reactions since last visit.  Patient denies any changes to medication list (herbals, prescription, & OTC).     Objective:   Please refer to today's Treatment Flowsheet & or below listed activities for exercises / interventions performed today. These addressed any or all of the following: education / ROM / strength / flexibility / tissue healing / tissue mobility / joint mobility / edema control / pain control / postural awareness ( which are necessary to achieve pt's LTFG's).  Cont with therapeutic ex working on trunk stabilization and endurance to improve her adls and decrease her c/o pain.    Active Stretches: gastroc  Passive Stretches:hamstring, piriformis and psoas to increase ROM, pt unable to perform independently.     Patient educated on continuing compliance with HEP.     Assessment:   Amber [name] is a 48 y.o. female who presents with symptoms consistent with LBP with radiculopathy. Pt exhibits decreased/impaired ROM/ flexibility/ strength/ posture and increased pain leading to altered function. Pt tolerated today's session fair with increased pain.  Plan:   Plan to continue to see 2-3 x /week per the POC for education, therapeutic  exercise, manual therapy prn progressing as patient tolerates focusing on increasing reducing deficits in order to improve overall function.      Re-certification due on or before 07/25/14 (re-assessment required on or before 07/25/14 per [place] law)      Harlene Ramus, PTA    Therapeutic Exercise (Timed Code): 31 minutes  Total Billable Time: 31 minutes

## 2014-07-19 NOTE — Progress Notes (Signed)
Physical Therapy Progress Report    Patient Information  Amber [name]   03/05/1966  [id number]     Referring Physician: Tillie FantasiaMcCoy, Lori D, DO   Medical Diagnosis: low back pain     DATE :07/19/2014  Visit 5 of 8-10 specified in original POC at time of Initial Evaluation.    Subjective:   Patient reports her pain is isolated to the L SI area. She denies radiating pain. She has trouble with the pain scale. Reports stinging pain in the L hip.      Summary List:  Patient denies any new medical diagnoses or changes in medical conditions since last visit.  Patient denies any operations or procedures since last visit.  Patient denies any new adverse or allergic drug reactions since last visit.  Patient denies any changes to medication list (herbals, prescription, & OTC).     Objective:   Please refer to today's Treatment Flowsheet & or below listed activities for exercises / interventions performed today. These addressed any or all of the following: education / ROM / strength / flexibility / tissue healing / tissue mobility / joint mobility / edema control / pain control / postural awareness ( which are necessary to achieve pt's LTFG's).      Manual Therapy Interventions:  Sacral mobs, muscle energy tech, Erhardt maneuver. Also did L5-S1 mobs using blocks to maintain neutral spine.  ROM:  Flex 2/3  Tight hams             Ext  3/3               R SB 3/3                 L SB  3/3  Minimal pain in any plane after treatment.  Did cat/camel stretch post treatment had no complaints    Patient educated on continuing compliance with HEP.       Assessment:   Amber [name] is a 48 y.o. female who presents with symptoms consistent with a mechanical dysfunction in the pelvic girdle. She reported 40-50% improvement post treatment. She stood on one foot with trunk flexed and rotated putting on her shoe with no apparent discomfort. i am pleased that her sx's are now localized to the L SI and no longer are radiating.        Plan:    She responded well to mobilizations. Will see one more week.    Amber [name], PT    Therapeutic Exercise (Timed Code): 15 minutes  Therapeutic Activity (Timed Code): 0 minutes  Manual Therapy (Timed Code): 15 minutes  Neuromuscular Re-Ed (Timed Code): 0 minutes  Total Billable Time: 30 minutes

## 2014-07-19 NOTE — Progress Notes (Signed)
REFERRAL TRACKING SHEET      Date Referral Received:  06/21/14    Phoned Patient:  06/22/14 left message for patient to return call

## 2014-07-20 MED ORDER — LOSARTAN-HYDROCHLOROTHIAZIDE 100 MG-12.5 MG TAB
ORAL_TABLET | Freq: Every day | ORAL | Status: DC
Start: 2014-07-20 — End: 2015-07-25

## 2014-07-20 MED ORDER — ESOMEPRAZOLE MAGNESIUM 40 MG CAP, DELAYED RELEASE
40 mg | ORAL_CAPSULE | Freq: Every day | ORAL | Status: DC
Start: 2014-07-20 — End: 2014-10-19

## 2014-07-20 MED ORDER — ATORVASTATIN 80 MG TAB
80 mg | ORAL_TABLET | Freq: Every day | ORAL | Status: DC
Start: 2014-07-20 — End: 2014-12-29

## 2014-07-20 NOTE — Progress Notes (Signed)
Referral sen to dr.bajorek

## 2014-07-20 NOTE — Progress Notes (Signed)
Referral sent to dr.bajorek

## 2014-07-20 NOTE — Progress Notes (Signed)
Progress Note      Name: Amber [name]     Age: 48 y.o.     Sex: female    CC:   Chief Complaint   Patient presents with   ??? Follow-up     has been taking PT @ [facility] for back pain d/t referral from last appt       HPI: Amber [name] is a/an 48 y.o. female who presents with back pain.     Patient states the back pain started years ago.  It is located in the lower left back.  The pain is described as sharp and dull, alternating and does radiate down the left leg.  The patient has tried nothing at home for relief.  She is going to the [facility] for PT and pool therapy.  Denies any bowel or bladder incontinence.      She does not want to see Dr. Elesa Hacker, did not like her experience during last scope.  Will refer to Dr. Marya Landry.      Still smoking 1 ppd, despite counseling against.  Recommended to NOT take wellbutrin.  She again experienced a seizure last week.  Will refer to Neurology. I have cautioned her not to drive with seizure history. Does not want to quit smoking.       Review of Systems:  General:  Denies weight gain or weight loss, denies loss of appetite.  Denies fever, chills, or night sweats.  Head: Denies headaches or dizziness.  Eyes: Denies visual changes.   Ears: Denies pain.  Nose: Denies nose bleeds or rhinorrhea.   Mouth and Throat:  Denies throat pain.  Respiratory: Positive for cough and shortness of breath.  Cardiovascular:  No chest pain, dyspnea on exertion, or edema.  GI:  Denies nausea, vomiting, hematemesis, diarrhea or constipation.  Does admit difficulty swallowing "any time."  Weight is stable.   GU: Denies dysuria, frequency, hesitancy.  MS:  Back pain as per HPI.         Visit Vitals   Item Reading   ??? BP 128/82 mmHg   ??? Pulse 72   ??? Temp 98.1 ??F (36.7 ??C)   ??? Resp 20   ??? Ht 5\' 4"  (1.626 m)   ??? Wt 151 lb (68.493 kg)   ??? BMI 25.91 kg/m2     Physical Exam:  General appearance: alert, cooperative, no distress   Head: Normocephalic, without obvious abnormality, atraumatic.  Eyes: conjunctivae/corneas clear. PERRL, EOM's intact.  Throat: Lips, mucosa, and tongue normal. Teeth and gums normal and normal findings: oropharynx pink & moist without lesions  Lungs: clear to auscultation bilaterally  Heart: regular rate and rhythm, S1, S2 normal, no murmur, no JVD or pedal edema  Abdomen: soft, non-tender, non-distended, bowel sounds present, no organomegaly  Extremities: no edema, redness or tenderness in the calves or thighs  Pulses: 2+ and symmetric  Skin: Skin color, texture, turgor normal. No rashes or lesions  Neurologic: Alert and oriented X 3, normal strength and tone. Normal symmetric reflexes.   Back:  Tenderness at left lower back and SI joint.       Medications and labs reviewed.                                              ASSESSMENT AND PLAN:  Amber [name] was seen today for follow-up.  Diagnoses and associated orders for this visit:    COPD (chronic obstructive pulmonary disease) (HCC)    Hyperlipidemia    Congenital hypothyroidism without goiter    Tobacco abuse    Anxiety state, unspecified    Tobacco abuse counseling    Lumbar pain with radiation down left leg    Swallowing difficulty  - REFERRAL TO GASTROENTEROLOGY    Convulsions, unspecified convulsion type (HCC)  - REFERRAL TO NEUROLOGY        Follow Up: 3 months    Johnson Arizola D Aalyiah Camberos, DO    Risk and benefits of medications as well as alternative therapies discussed with patient   who voiced understanding and agrees with treatment plan.

## 2014-07-20 NOTE — Patient Instructions (Signed)
MyChart Activation    Thank you for requesting access to MyChart. Please follow the instructions below to securely access and download your online medical record. MyChart allows you to send messages to your doctor, view your test results, renew your prescriptions, schedule appointments, and more.    How Do I Sign Up?    1. In your internet browser, go to [email or web address]  2. Click on the First Time User? Click Here link in the Sign In box. You will be redirect to the New Member Sign Up page.  3. Enter your MyChart Access Code exactly as it appears below. You will not need to use this code after you???ve completed the sign-up process. If you do not sign up before the expiration date, you must request a new code.    MyChart Access Code: [email or web address]  Expires: 10/18/2014  2:40 PM (This is the date your MyChart access code will expire)    4. Enter the last four digits of your Social Security Number (xxxx) and Date of Birth (mm/dd/yyyy) as indicated and click Submit. You will be taken to the next sign-up page.  5. Create a MyChart ID. This will be your MyChart login ID and cannot be changed, so think of one that is secure and easy to remember.  6. Create a MyChart password. You can change your password at any time.  7. Enter your Password Reset Question and Answer. This can be used at a later time if you forget your password.   8. Enter your e-mail address. You will receive e-mail notification when new information is available in MyChart.  9. Click Sign Up. You can now view and download portions of your medical record.  10. Click the Download Summary menu link to download a portable copy of your medical information.    Additional Information    If you have questions, please visit the Frequently Asked Questions section of the MyChart website at [email or web address] Remember, MyChart is NOT to be used for urgent needs. For medical emergencies, dial 911.      [facility]   Office working hours are Monday - Friday 8 am to 6 pm.   Office phone number is [phone] and fax is [phone].  For non-emergent medical care and clinical advice during office hours:   1. Call office or   2.  Send message or request by MyChart  For non-emergent medical care and clinical advice after office hours:  1.  Call [phone] or  2.  Send message or request by MyChart  Emergency care can be obtained at the [facility], Urgent Care or calling 911.    Patient Satisfaction Survey  We appreciate you giving us your e-mail address.  Please watch for our patient satisfaction survey which you will receive by e-mail.  We strive to provide you with the best care possible.  We respect all comments and will take comments into consideration to improve our service.  Thank you for your participation.

## 2014-07-20 NOTE — Progress Notes (Signed)
Referral sent to [email or web address]

## 2014-07-20 NOTE — Progress Notes (Signed)
Referral sent to [facility] per fax. Patient requested this office

## 2014-07-20 NOTE — Progress Notes (Signed)
I reviewed the patient's medical history, the resident's findings on physical examination, the patient's diagnoses, and treatment plan as documented in the resident note.  I concur with the treatment plan as documented.  Additional suggestions noted.

## 2014-07-21 LAB — TSH 3RD GENERATION: TSH: 3.45 u[IU]/mL (ref 0.35–3.74)

## 2014-07-21 NOTE — Progress Notes (Signed)
Quick Note:        Just want you to have it.        Amber [name].    ______

## 2014-08-04 NOTE — Progress Notes (Addendum)
Physical Therapy Progress Note    Patient Information  Babe Anthis   1966-04-19  [id number]     Referring Physician: Tillie Fantasia, DO   Medical Diagnosis: L LBP    DATE :08/04/2014    Subjective:   Patient reports she had increased pain in the Low back after last visit.  Still no pain in the leg.  Hard for her to rate or describe pain. Has pressure in the back and bending to tie shoes is painful.      Summary List:  Patient denies any new medical diagnoses or changes in medical conditions since last visit.  Patient denies any operations or procedures since last visit.  Patient denies any new adverse or allergic drug reactions since last visit.  Patient denies any changes to medication list (herbals, prescription, & OTC).     Objective:   Please refer to today's Treatment Flowsheet & or below listed activities for exercises / interventions performed today. These addressed any or all of the following: education / ROM / strength / flexibility / tissue healing / tissue mobility / joint mobility / edema control / pain control / postural awareness ( which are necessary to achieve pt's LTFG's).     Did no manual tech today, just concentrated on core strengthening.    Patient educated on continuing compliance with HEP.       Assessment:   Muskaan Smet is a 48 y.o. female who presents with symptoms consistent with LBP. She tolerated ex's well.        Plan:   Plan to continue to see 2 x /week per the POC progressing as patient tolerates focusing on increasing reducing deficits in order to improve overall function.        Minna Merritts, PT    Therapeutic Exercise (Timed Code): 30 minutes  Therapeutic Activity (Timed Code): 0 minutes  Manual Therapy (Timed Code): 0 minutes  Neuromuscular Re-Ed (Timed Code): 0 minutes  Total Billable Time: 30 minutes

## 2014-08-09 NOTE — Progress Notes (Addendum)
Physical Therapy Progress Report    Patient Information  Amber [name]   1966-11-09  [id number]     Referring Physician: Tillie Fantasia, DO   Medical Diagnosis: low back pain     DATE :08/09/2014  Visit 7 of 8-10 specified in original POC at time of Initial Evaluation.    Subjective:   Patient reports her pain has been worse since last rx.  States she felt it was due to laying prone and performing hip extension.  Isn't sleeping and hasn't had any relief since that time.  Slight decrease in pain after rx.   Pain level before ex: 8 out of 10.  Pain level after ex:6 out of 10.  Summary List:  Patient denies any new medical diagnoses or changes in medical conditions since last visit.  Patient denies any operations or procedures since last visit.  Patient denies any new adverse or allergic drug reactions since last visit.  Patient denies any changes to medication list (herbals, prescription, & OTC).     Objective:   Please refer to today's Treatment Flowsheet & or below listed activities for exercises / interventions performed today. These addressed any or all of the following: education / ROM / strength / flexibility / tissue healing / tissue mobility / joint mobility / edema control / pain control / postural awareness ( which are necessary to achieve pt's LTFG's).      Manual Therapy Interventions: 0  Did cat/camel stretch post treatment had no complaints    Patient educated on continuing compliance with HEP.       Assessment:   Amber [name] is a 48 y.o. female who presents with symptoms consistent with a back pain, she feels the area her back pain covers is larger.  She has had increased pain the last week or so.  Moving slow and guarded and transitional movements cont to be very painful.  Increased pain with most ex today.        Plan:   If she cont to have severe pain she will call and get rx with her dr.     Harlene Ramus, PTA    Therapeutic Exercise (Timed Code): 30 minutes   Therapeutic Activity (Timed Code): 0 minutes  Manual Therapy (Timed Code): 0 minutes  Neuromuscular Re-Ed (Timed Code): 0 minutes  Total Billable Time: 30 minutes

## 2014-08-18 NOTE — Patient Instructions (Signed)
MyChart Activation    Thank you for requesting access to MyChart. Please follow the instructions below to securely access and download your online medical record. MyChart allows you to send messages to your doctor, view your test results, renew your prescriptions, schedule appointments, and more.    How Do I Sign Up?    1. In your internet browser, go to [email or web address]  2. Click on the First Time User? Click Here link in the Sign In box. You will be redirect to the New Member Sign Up page.  3. Enter your MyChart Access Code exactly as it appears below. You will not need to use this code after you???ve completed the sign-up process. If you do not sign up before the expiration date, you must request a new code.    MyChart Access Code: [email or web address]  Expires: 10/18/2014  2:40 PM (This is the date your MyChart access code will expire)    4. Enter the last four digits of your Social Security Number (xxxx) and Date of Birth (mm/dd/yyyy) as indicated and click Submit. You will be taken to the next sign-up page.  5. Create a MyChart ID. This will be your MyChart login ID and cannot be changed, so think of one that is secure and easy to remember.  6. Create a MyChart password. You can change your password at any time.  7. Enter your Password Reset Question and Answer. This can be used at a later time if you forget your password.   8. Enter your e-mail address. You will receive e-mail notification when new information is available in MyChart.  9. Click Sign Up. You can now view and download portions of your medical record.  10. Click the Download Summary menu link to download a portable copy of your medical information.    Additional Information    If you have questions, please visit the Frequently Asked Questions section of the MyChart website at [email or web address] Remember, MyChart is NOT to be used for urgent needs. For medical emergencies, dial 911.      [facility]   Office working hours are Monday - Thursday 9:00 am to 5:00 pm.  Friday 8 am to 12 noon with limited staff.  Office phone number is [phone] and fax is [phone].  For non-emergent medical care and clinical advice during office hours:   1. Call office or   2.  Send message or request using MyChart  For non-emergent medical care and clinical advice after office hours:  1.  Call [phone] or  2.  Send message or request using MyChart  Emergency care can be obtained at the [facility] ER, Urgent Care or calling 911.    Patient Satisfaction Survey  We appreciate you giving us your e-mail address.  Please watch for our patient satisfaction survey which you will receive by e-mail.  We strive to provide you with the best care possible.  We respect all comments and will take comments into consideration to improve our service.  Thank you for your participation.    As a valued patient, you will be receiving a survey from [employer].  We encourage you to share your thoughts and opinions about the care you received today.  Thank you for choosing [facility].
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
## 2014-08-31 MED ORDER — GADOBUTROL 7.5 MMOL/7.5 ML (1 MMOL/ML) IV
7.5 mmol/ mL (1 mmol/mL) | Freq: Once | INTRAVENOUS | Status: AC
Start: 2014-08-31 — End: 2014-08-31
  Administered 2014-08-31: 18:00:00 via INTRAVENOUS

## 2014-08-31 MED FILL — GADAVIST 7.5 MMOL/7.5 ML (1 MMOL/ML) INTRAVENOUS SOLUTION: 7.5 mmol/ mL (1 mmol/mL) | INTRAVENOUS | Qty: 7.5

## 2014-09-04 NOTE — Procedures (Signed)
[facility]      PT Name:  Amber [name], Amber [name]            Admitted:  08/31/2014  MR#:  [id number]                     DOB:  [birth year]  Account #:  [id number]            Age:  48  Dictator:  Chauncy Lean, M.D.   Location:        ELECTROENCEPHALOGRAM REPORT    DATE OF STUDY: 08/31/2014    HISTORY:  This is a 48 year old female who was following with a neurologist in  [place] and wanted to establish care here. She has a history of  migraine headaches and history of shaking of bilateral upper limbs  with loss of consciousness.  No postictal confusion.    DESCRIPTION:  This EEG was greater than one hour.    When maximally awake, posterior dominant rhythm consisted of 9-10  hertz of Alpha rhythm.  Drowsiness is characterized by diffuse Theta  and Delta slowing. Stage 2 sleep is attained with  K-complexes and sleep spindles. Posterior occipital chock transients  were seen in Stage 1 sleep.  No focal slowing seen.  No interictal  epileptiform discharges is seen. No seizure seen.    IMPRESSION:  This is a normal awake and asleep electroencephalogram.        ___________________________________  Chauncy Lean, M.D.    SK:sp  DD:09/04/2014 18:52:09   DT: 09/05/2014 06:07:42   Job ID:  [id number]  CC:              Document #:  [id number]

## 2014-09-04 NOTE — Procedures (Signed)
[facility]      PT Name:  [name], Amber S            Admitted:  08/31/2014  MR#:  [id number]                     DOB:  03/17/1966  Account #:  [id number]            Age:  47  Dictator:  Vasilis Luhman, M.D.   Location:        ELECTROENCEPHALOGRAM REPORT    DATE OF STUDY: 08/31/2014    HISTORY:  This is a 47 year old female who was following with a neurologist in  [place] and wanted to establish care here. She has a history of  migraine headaches and history of shaking of bilateral upper limbs  with loss of consciousness.  No postictal confusion.    DESCRIPTION:  This EEG was greater than one hour.    When maximally awake, posterior dominant rhythm consisted of 9-10  hertz of Alpha rhythm.  Drowsiness is characterized by diffuse Theta  and Delta slowing. Stage 2 sleep is attained with  K-complexes and sleep spindles. Posterior occipital chock transients  were seen in Stage 1 sleep.  No focal slowing seen.  No interictal  epileptiform discharges is seen. No seizure seen.    IMPRESSION:  This is a normal awake and asleep electroencephalogram.        ___________________________________  Rhina Kramme, M.D.    SK:sp  DD:09/04/2014 18:52:09   DT: 09/05/2014 06:07:42   Job ID:  [id number]  CC:              Document #:  [id number]

## 2014-09-06 NOTE — Progress Notes (Signed)
Discharge  Pt was seen 6 times for treatment of back pain with radiculopathy. Her pain was localized to the back but it was exacerbated the past 2 visits.   Goals were partially met.

## 2014-09-19 ENCOUNTER — Ambulatory Visit
Admit: 2014-09-19 | Discharge: 2014-09-19 | Payer: PRIVATE HEALTH INSURANCE | Attending: Neurology | Primary: Family Medicine

## 2014-09-19 DIAGNOSIS — J321 Chronic frontal sinusitis: Secondary | ICD-10-CM

## 2014-09-19 MED ORDER — PRIMIDONE 50 MG TAB
50 mg | ORAL_TABLET | Freq: Every evening | ORAL | Status: DC
Start: 2014-09-19 — End: 2014-09-19

## 2014-09-19 MED ORDER — PROPRANOLOL 80 MG 24 HR SUSTAINED ACTION CAP
80 mg | ORAL_CAPSULE | Freq: Every day | ORAL | Status: DC
Start: 2014-09-19 — End: 2014-12-29
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
## 2014-09-19 NOTE — Patient Instructions (Signed)
MyChart Activation    Thank you for requesting access to MyChart. Please follow the instructions below to securely access and download your online medical record. MyChart allows you to send messages to your doctor, view your test results, renew your prescriptions, schedule appointments, and more.    How Do I Sign Up?    1. In your internet browser, go to [email or web address]  2. Click on the First Time User? Click Here link in the Sign In box. You will be redirect to the New Member Sign Up page.  3. Enter your MyChart Access Code exactly as it appears below. You will not need to use this code after you???ve completed the sign-up process. If you do not sign up before the expiration date, you must request a new code.    MyChart Access Code: [email or web address]  Expires: 10/18/2014  2:40 PM (This is the date your MyChart access code will expire)    4. Enter the last four digits of your Social Security Number (xxxx) and Date of Birth (mm/dd/yyyy) as indicated and click Submit. You will be taken to the next sign-up page.  5. Create a MyChart ID. This will be your MyChart login ID and cannot be changed, so think of one that is secure and easy to remember.  6. Create a MyChart password. You can change your password at any time.  7. Enter your Password Reset Question and Answer. This can be used at a later time if you forget your password.   8. Enter your e-mail address. You will receive e-mail notification when new information is available in MyChart.  9. Click Sign Up. You can now view and download portions of your medical record.  10. Click the Download Summary menu link to download a portable copy of your medical information.    Additional Information    If you have questions, please visit the Frequently Asked Questions section of the MyChart website at [email or web address] Remember, MyChart is NOT to be used for urgent needs. For medical emergencies, dial 911.      [facility]   Office working hours are Monday - Thursday 9:00 am to 5:00 pm.  Friday 8 am to 12 noon with limited staff.  Office phone number is [phone] and fax is [phone].  For non-emergent medical care and clinical advice during office hours:   1. Call office or   2.  Send message or request using MyChart  For non-emergent medical care and clinical advice after office hours:  1.  Call [phone] or  2.  Send message or request using MyChart  Emergency care can be obtained at the [facility] ER, Urgent Care or calling 911.    Patient Satisfaction Survey  We appreciate you giving us your e-mail address.  Please watch for our patient satisfaction survey which you will receive by e-mail.  We strive to provide you with the best care possible.  We respect all comments and will take comments into consideration to improve our service.  Thank you for your participation.    As a valued patient, you will be receiving a survey from [employer].  We encourage you to share your thoughts and opinions about the care you received today.  Thank you for choosing [facility].

## 2014-09-20 NOTE — Progress Notes (Signed)
Faxed referral to ENT, Dr Excell SeltzerBaker

## 2014-09-21 NOTE — Telephone Encounter (Signed)
Patient called requesting information about her REFERRAL TO PSYCHIATRY (Order # [id number])  from July  Patient stated that she tried [facility] and she does not want to go there again.

## 2014-10-19 ENCOUNTER — Ambulatory Visit
Admit: 2014-10-19 | Discharge: 2014-10-19 | Payer: PRIVATE HEALTH INSURANCE | Attending: Family Medicine | Primary: Family Medicine

## 2014-10-19 DIAGNOSIS — R609 Edema, unspecified: Secondary | ICD-10-CM

## 2014-10-19 DIAGNOSIS — F32A Depression, unspecified: Secondary | ICD-10-CM

## 2014-10-19 MED ORDER — NICOTINE 21 MG/24 HR DAILY PATCH
21 mg/24 hr | MEDICATED_PATCH | TRANSDERMAL | Status: AC
Start: 2014-10-19 — End: 2014-11-18

## 2014-10-19 MED ORDER — ESOMEPRAZOLE MAGNESIUM 40 MG CAP, DELAYED RELEASE
40 mg | ORAL_CAPSULE | Freq: Every day | ORAL | Status: DC
Start: 2014-10-19 — End: 2014-12-29

## 2014-10-19 NOTE — Telephone Encounter (Signed)
Patient states that Dr. Ewell PoeSaroch's office explained to her that she had been denied her referral (would not state a reason).  Patient is requesting to be referred to a different provider for these services.

## 2014-10-19 NOTE — Progress Notes (Signed)
Progress Note    Patient: Amber [name]               Sex: female          MRN: [id number]       Date of Birth:  Jul 14, 1966      Age:  48 y.o.              Subjective:     CC:    Chief Complaint   Patient presents with   ??? Shoulder Pain     patient states having some shoulder and back pain today     Depression and anxiety:  Tried [facility] again in the recent 3 months, was told she would "never see a doctor because she had an attempted suicide attempt in the past." She filled out paperwork herself for Dr. Ewell PoeSaroch's office and hasn't heard anything.  [facility] She is currently not on any medicine for depression.  Says "I am flipping out.  I just sit and shake."  She has tried and failed Prozac, Paxil, Lexapro and Abilify.  She felt like the depression was never fully controlled.  She continues to try to get in with Psychiatry and has had trouble obtaining appointments due to insurance.  Samples of Viibryd given.     GERD:  Has a lot of heartburn, does not faithfully take Nexium.  Occasional vomiting. Has chronic diarrhea.      Tobacco abuse:  Smoking more up to 2 ppd now, due to stress at home.  Rx for patches today.        Pertinent past medical, family and/or social history:  Smoker, 2 ppd.      Review of Systems  General:  Has some fluctuations of weight sometimes.  Denies fever, chills, or night sweats.  Skin: No rash or itching.  HEENT: Normal.   Respiratory: No cough or shortness of breath.  Cardiovascular:  No chest pain, pressure, or palpitations.  GI:  Denies nausea, vomiting, diarrhea, or constipation.  Extremities: Does have some lower extremity swelling.  Psych: Depression and anxiety, mood swings.          Objective:      BP 134/80 mmHg   Pulse 75   Temp(Src) 98.5 ??F (36.9 ??C) (Oral)   Resp 21   Ht 5\' 4"  (1.626 m)   Wt 148 lb (67.132 kg)   BMI 25.39 kg/m2   SpO2 99%   LMP 07/27/2010    Physical Exam  General:   WN/WD, NAD   HEENT:   Anicteric sclerae, mucous membranes moist and pink, TMs WNL b/l,  oropharynx without exudate  Neck:  No carotid bruits. No appreciable thyromegaly.   Cardio:   RRR, no M/R/G noted.  No lower extremity edema.   Lungs:   CTAB, no W/R/R noted  Abdomen:   Soft, non-tender, non-distended, BS+  Musculoskeletal:  Normal gait observed.   Neuro:             AO x 3.     Psych:  Affect is appropriate.        Medications/Imaging/Labs Ordered or Reviewed:  Cbc, cmp, tsh    Assessment/Plan     Amber KidneyDebra was seen today for shoulder pain.    Diagnoses and all orders for this visit:    Anxiety and depression    Tobacco abuse  Orders:  -     nicotine (NICODERM CQ) 21 mg/24 hr; 1 Patch by TransDERmal route  every twenty-four (24) hours for 30 days.    Tobacco abuse counseling    Gastroesophageal reflux disease without esophagitis  Orders:  -     esomeprazole (NEXIUM) 40 mg capsule; Take 1 Cap by mouth daily. BRAND ONLY  Indications: GASTROESOPHAGEAL REFLUX    Encounter for diagnostic colonoscopy due to change in bowel habits  Orders:  -     REFERRAL TO GASTROENTEROLOGY    Swallowing difficulty    Edema  Orders:  -     CBC WITH AUTOMATED DIFF; Future  -     METABOLIC PANEL, COMPREHENSIVE; Future  -     TSH, 3RD GENERATION; Future            Management:  Stop smoking, keep all follow up appts.     Follow up in 1 month.         Risk and benefits of medications as well as alternative therapies discussed with patient who voiced understanding and agrees with treatment plan.    Keane ScrapeLori D Gurshaan Matsuoka, DO 2:06 PM

## 2014-10-20 ENCOUNTER — Encounter: Attending: Family Medicine | Primary: Family Medicine

## 2014-10-20 ENCOUNTER — Inpatient Hospital Stay: Admit: 2014-10-20 | Payer: BLUE CROSS/BLUE SHIELD | Primary: Family Medicine

## 2014-10-20 LAB — METABOLIC PANEL, COMPREHENSIVE
A-G Ratio: 1 — ABNORMAL LOW (ref 1.2–2.2)
ALT (SGPT): 17 U/L (ref 12–78)
AST (SGOT): 16 U/L (ref 15–37)
Albumin: 3.7 g/dL (ref 3.4–5.0)
Alk. phosphatase: 102 U/L (ref 45–117)
Anion gap: 7 mmol/L (ref 6–15)
BUN/Creatinine ratio: 12 (ref 7–25)
BUN: 11 MG/DL (ref 7–18)
Bilirubin, total: 0.7 MG/DL (ref <1.1)
CO2: 27 mmol/L (ref 21–32)
Calcium: 8.8 MG/DL (ref 8.5–10.1)
Chloride: 106 mmol/L (ref 98–107)
Creatinine: 0.9 MG/DL (ref 0.60–1.30)
GFR est AA: >60 mL/min/{1.73_m2} (ref >60)
GFR est non-AA: >60 mL/min/{1.73_m2} (ref >60)
Globulin: 3.6 g/dL — ABNORMAL HIGH (ref 2.4–3.5)
Glucose: 108 mg/dL (ref 70–110)
Potassium: 3.4 mmol/L — ABNORMAL LOW (ref 3.5–5.3)
Protein, total: 7.3 g/dL (ref 6.4–8.2)
Sodium: 140 mmol/L (ref 136–145)

## 2014-10-20 LAB — CBC WITH AUTOMATED DIFF
ABS. BASOPHILS: 0.1 10*3/uL (ref 0.0–0.1)
ABS. EOSINOPHILS: 0.1 10*3/uL (ref 0.0–0.5)
ABS. LYMPHOCYTES: 2.4 10*3/uL (ref 0.8–3.5)
ABS. MONOCYTES: 0.5 10*3/uL — ABNORMAL LOW (ref 0.8–3.5)
ABS. NEUTROPHILS: 4.4 10*3/uL (ref 1.5–8.0)
BASOPHILS: 1 % (ref 0–2)
EOSINOPHILS: 1 % (ref 0–5)
HCT: 38.7 % — ABNORMAL LOW (ref 41–53)
HGB: 13 g/dL (ref 12.0–16.0)
LYMPHOCYTES: 32 % (ref 19–48)
MCH: 30.2 PG (ref 27–31)
MCHC: 33.6 g/dL (ref 31–37)
MCV: 90 FL (ref 80–100)
MONOCYTES: 7 % (ref 3–9)
MPV: 10.6 FL — ABNORMAL HIGH (ref 5.9–10.3)
NEUTROPHILS: 59 % (ref 40–74)
PLATELET: 158 10*3/uL (ref 130–400)
RBC: 4.3 M/uL (ref 4.2–5.4)
RDW: 12.8 % (ref 11.5–14.5)
WBC: 7.4 10*3/uL (ref 4.5–10.8)

## 2014-10-20 LAB — TSH 3RD GENERATION: TSH: 0.18 u[IU]/mL — ABNORMAL LOW (ref 0.35–3.74)

## 2014-10-20 NOTE — Progress Notes (Signed)
Referral sent dr.mailloux

## 2014-10-21 NOTE — Progress Notes (Signed)
Quick Note:        Thyroid lab abnormal, needs repeat in 1 month.    ______

## 2014-10-27 NOTE — Progress Notes (Signed)
Quick Note:        Patient notified.    ______

## 2014-11-15 ENCOUNTER — Encounter: Primary: Family Medicine

## 2014-11-15 ENCOUNTER — Encounter: Attending: Neurology | Primary: Family Medicine

## 2014-11-25 ENCOUNTER — Encounter: Primary: Family Medicine

## 2014-12-22 ENCOUNTER — Encounter: Attending: Gastroenterology | Primary: Family Medicine

## 2014-12-26 ENCOUNTER — Encounter

## 2014-12-27 ENCOUNTER — Inpatient Hospital Stay: Admit: 2014-12-27 | Payer: BLUE CROSS/BLUE SHIELD | Primary: Family Medicine

## 2014-12-27 ENCOUNTER — Inpatient Hospital Stay: Admit: 2014-12-27 | Payer: BLUE CROSS/BLUE SHIELD | Attending: Neurology | Primary: Family Medicine

## 2014-12-27 DIAGNOSIS — Z1231 Encounter for screening mammogram for malignant neoplasm of breast: Secondary | ICD-10-CM

## 2014-12-29 ENCOUNTER — Inpatient Hospital Stay
Admit: 2014-12-29 | Discharge: 2015-01-03 | Disposition: A | Discharge status: Home / Self Care | Payer: BLUE CROSS/BLUE SHIELD | Attending: Psychiatry | Admitting: Psychiatry

## 2014-12-29 DIAGNOSIS — F319 Bipolar disorder, unspecified: Principal | ICD-10-CM

## 2014-12-29 LAB — ACETAMINOPHEN: Acetaminophen level: <2 ug/mL — ABNORMAL LOW (ref 10–30)

## 2014-12-29 LAB — THYROID PANEL W/TSH
Free thyroxine index: 3.9 (ref 1.4–5.2)
T3 Uptake: 37 % (ref 31–39)
T4, Total: 10.5 ug/dL (ref 4.7–13.3)
TSH: 0.14 u[IU]/mL — ABNORMAL LOW (ref 0.35–3.74)

## 2014-12-29 LAB — URINALYSIS W/ RFLX MICROSCOPIC
Blood: NEGATIVE
Glucose: NEGATIVE mg/dL
Ketone: NEGATIVE mg/dL
Leukocyte Esterase: NEGATIVE
Nitrites: NEGATIVE
Protein: NEGATIVE mg/dL
Specific gravity: 1.025 (ref 1.002–1.030)
Urobilinogen: 1 EU/dL (ref 0–1)
pH (UA): 6 (ref 4.5–8.0)

## 2014-12-29 LAB — CBC WITH AUTOMATED DIFF
ABS. BASOPHILS: 0 10*3/uL (ref 0.0–0.1)
ABS. EOSINOPHILS: 0.1 10*3/uL (ref 0.0–0.5)
ABS. LYMPHOCYTES: 1.5 10*3/uL (ref 0.8–3.5)
ABS. MONOCYTES: 0.3 10*3/uL — ABNORMAL LOW (ref 2.0–8.0)
ABS. NEUTROPHILS: 3.1 10*3/uL (ref 1.5–8.0)
BASOPHILS: 1 % (ref 0–2)
EOSINOPHILS: 2 % (ref 0–5)
HCT: 36.7 % — ABNORMAL LOW (ref 41–53)
HGB: 12.6 g/dL (ref 12.0–16.0)
LYMPHOCYTES: 30 % (ref 19–48)
MCH: 30.8 PG (ref 27–31)
MCHC: 34.3 g/dL (ref 31–37)
MCV: 89.7 FL (ref 80–100)
MONOCYTES: 6 % (ref 3–9)
MPV: 8.9 FL (ref 5.9–10.3)
NEUTROPHILS: 61 % (ref 40–74)
PLATELET: 164 10*3/uL (ref 130–400)
RBC: 4.09 M/uL — ABNORMAL LOW (ref 4.2–5.4)
RDW: 12.7 % (ref 11.5–14.5)
WBC: 5.1 10*3/uL (ref 4.5–10.8)

## 2014-12-29 LAB — MAGNESIUM: Magnesium: 1.7 mg/dL — ABNORMAL LOW (ref 1.8–2.4)

## 2014-12-29 LAB — URINE MICROSCOPIC

## 2014-12-29 LAB — METABOLIC PANEL, BASIC
Anion gap: 6 mmol/L (ref 6–15)
BUN/Creatinine ratio: 10 (ref 7–25)
BUN: 8 MG/DL (ref 7–18)
CO2: 31 mmol/L (ref 21–32)
Calcium: 8.3 MG/DL — ABNORMAL LOW (ref 8.5–10.1)
Chloride: 106 mmol/L (ref 98–107)
Creatinine: 0.82 MG/DL (ref 0.60–1.30)
GFR est AA: >60 mL/min/{1.73_m2} (ref >60)
GFR est non-AA: >60 mL/min/{1.73_m2} (ref >60)
Glucose: 87 mg/dL (ref 70–110)
Potassium: 3.9 mmol/L (ref 3.5–5.3)
Sodium: 143 mmol/L (ref 136–145)

## 2014-12-29 LAB — DRUG SCREEN, URINE
AMPHETAMINES: NEGATIVE
BARBITURATES: NEGATIVE
BENZODIAZEPINES: POSITIVE
COCAINE: NEGATIVE
METHADONE: NEGATIVE
OPIATES: NEGATIVE
PCP(PHENCYCLIDINE): NEGATIVE
THC (TH-CANNABINOL): POSITIVE
TRICYCLICS: NEGATIVE

## 2014-12-29 LAB — ETHYL ALCOHOL: ALCOHOL(ETHYL),SERUM: <10 MG/DL

## 2014-12-29 LAB — SALICYLATE: Salicylate level: 3.9 MG/DL (ref 2.8–20.0)

## 2014-12-29 MED ORDER — MAGNESIUM CHLORIDE 64 MG TABLET,DELAYED RELEASE
64 mg | ORAL | Status: AC
Start: 2014-12-29 — End: 2014-12-30

## 2014-12-29 MED FILL — MAG-DELAY 64 MG TABLET,DELAYED RELEASE: 64 mg | ORAL | Qty: 1

## 2014-12-29 NOTE — ED Notes (Signed)
hospitalist at bedside
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
## 2014-12-29 NOTE — ED Notes (Signed)
Patient resting in ER 5. Security guard at bedside for suicide precautions. Patient refuses blood work and refuses to give a urine sample. Patient will not talk to me or answer any questions. Provider aware. Behavioral Health Intake Coordinator notified to see patient.

## 2014-12-29 NOTE — ED Notes (Signed)
Pt is pink warm and dry. Pt is watching TV at this time. Pt is calm and cooperative at this time. Security in hallway outside of pt room. Report from [place].

## 2014-12-29 NOTE — ED Notes (Signed)
Report to BentonAnn. Pt is pink warm and dry resp are even and unlabored. Pt has been cooperative until this point and is requesting to go smoke.

## 2014-12-29 NOTE — Progress Notes (Signed)
Chart screened for discharge planning needs per Case Management protocol. Based on the information currently available in medical record, no care manager has been assigned and no intervention required at this time. CM available to assist  PRN if any needs arise. [facility].
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
## 2014-12-29 NOTE — ED Notes (Signed)
Report to Stephanie RN.

## 2014-12-29 NOTE — ED Notes (Signed)
Admits to SI. Plan is " blow my head off ".

## 2014-12-29 NOTE — ED Notes (Signed)
Spoke with [place] in lab.  States CBC is still running and will run drug screen immediately.  Awaiting result.

## 2014-12-29 NOTE — ED Notes (Signed)
Patient states that she is willing to have blood and urine tests done now. Phlebotomist notified for lab draw.

## 2014-12-29 NOTE — ED Notes (Signed)
Patient given sandwich and drink per request.

## 2014-12-29 NOTE — Behavioral Health Treatment Team (Signed)
Intake assessment:  Pt arrived to the er per husband.  States that she had a gun and was going to shoot herself in the head.  She has multiple suicide attempts in the past.  She states that things would be better if she wasn't here.  She states that she has been feeling this way for about a month and has been off of her psych meds for 1 year.  She is having visual hallucinations, seeing dead sister and shadows.  She is also having auditory hallucinations, hearing phones ringing, and hears a female voice that is not commanding anything from her.  Pt refused lab work when she first arrived because she was afraid someone would find out that she took a xanax.  She has now agreed to have the lab work done because she wants help.

## 2014-12-29 NOTE — ED Notes (Signed)
Security notified to provide 1:1 monitoring.

## 2014-12-30 MED ORDER — LORAZEPAM 2 MG TAB
2 mg | Freq: Four times a day (QID) | ORAL | Status: DC | PRN
Start: 2014-12-30 — End: 2015-01-03
  Administered 2014-12-30 – 2015-01-03 (×13): via ORAL

## 2014-12-30 MED ORDER — LOSARTAN 50 MG TAB
50 mg | Freq: Every day | ORAL | Status: DC
Start: 2014-12-30 — End: 2015-01-03
  Administered 2014-12-30 – 2015-01-03 (×6): via ORAL

## 2014-12-30 MED ORDER — LEVOTHYROXINE 25 MCG TAB
25 mcg | Freq: Every day | ORAL | Status: DC
Start: 2014-12-30 — End: 2015-01-03
  Administered 2014-12-30 – 2015-01-03 (×5): via ORAL

## 2014-12-30 MED ORDER — HYDROCHLOROTHIAZIDE 25 MG TAB
25 mg | Freq: Every day | ORAL | Status: DC
Start: 2014-12-30 — End: 2014-12-29

## 2014-12-30 MED ORDER — HYDROCHLOROTHIAZIDE 25 MG TAB
25 mg | Freq: Every day | ORAL | Status: DC
Start: 2014-12-30 — End: 2015-01-03
  Administered 2014-12-30 – 2015-01-03 (×6): via ORAL

## 2014-12-30 MED ORDER — NEBIVOLOL 5 MG TAB
5 mg | Freq: Every day | ORAL | Status: DC
Start: 2014-12-30 — End: 2015-01-03
  Administered 2014-12-30 – 2015-01-03 (×6): via ORAL

## 2014-12-30 MED ORDER — LORAZEPAM 2 MG TAB
2 mg | ORAL | Status: AC
Start: 2014-12-30 — End: 2014-12-30
  Administered 2014-12-30: 18:00:00

## 2014-12-30 MED FILL — HYDROCHLOROTHIAZIDE 25 MG TAB: 25 mg | ORAL | Qty: 1

## 2014-12-30 MED FILL — BYSTOLIC 5 MG TABLET: 5 mg | ORAL | Qty: 1

## 2014-12-30 MED FILL — LORAZEPAM 2 MG TAB: 2 mg | ORAL | Qty: 1

## 2014-12-30 MED FILL — LEVOTHYROXINE 25 MCG TAB: 25 mcg | ORAL | Qty: 1

## 2014-12-30 MED FILL — LOSARTAN 50 MG TAB: 50 mg | ORAL | Qty: 2

## 2014-12-30 NOTE — Other (Signed)
[facility]  Group Note  [address] [place] [address]        Date of service: 12/30/14    Start time: 1:00 pm  Stop time: 2:00 pm    Type of session: Activity    Problem number: Therapeutic Recreation: Pt attended recreation/activity group and participated in playing corn hole with other pt's. Pt interacted well with other group members. Enjoyed activity and voiced no problems or concerns.    Short term goal (STG): Pt will attend two recreation/activity groups a day    Intervention/techniques: Observed/Monitored    Patient mental status/affect: Calm; Cooperative    Patient behavior/appearance: Neatly Groomed    Special patient treatment accommodations provided (describe): None needed    Patient response and progress towards goals: Pt is responding towards goal by attending recreation/activity groups

## 2014-12-30 NOTE — Behavioral Health Treatment Team (Signed)
Start:   2055  Stop:    2120                         RN daily wrap up group:   Went over day and any issues may have had.   Discussed how to handle difficult people.   Participated and interacted well with others.

## 2014-12-30 NOTE — Other (Signed)
Group Therapy Note: Pt did not attend group therapy meeting .

## 2014-12-30 NOTE — Progress Notes (Signed)
Problem: Nutrition Deficit  Goal: *Optimize nutritional status  Outcome: Not Progressing Towards Goal  MST-generated consult received 2/2 pt statement of 56# wt loss x one month.  Currently, there are no documented PO intakes and no weight other than a "patient stated" weight. There is currently no diet order. Chart reviewed, and per H&P pt with Depression, Anxiety, SI and homicidal ideation, drug abuse. H/o HTN, COPD, GERD, IBS; diabetic neuropathy listed but no h/o DM and current GLU 87.  RD will continue to follow for more information and diet order to complete assessment of patient and order supplements as needed.

## 2014-12-30 NOTE — Other (Signed)
Family Contact Note: Pt. Signed a release for her husband, Amber [name] [phone] or [phone], but asked that limited information be given to him.

## 2014-12-30 NOTE — Progress Notes (Signed)
Patient assessment complete. Orientation as follows:   Awake, alert, oriented times 4.                                                 Pain  0   Out of 10, Anxiety  10  Out of 10      Depression   10    Out of 10.  Denies auditory and visual hallucinations. States:  "I feel better, I just feel nervous."Requests ativan. Ativan given.
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
## 2014-12-30 NOTE — Other (Signed)
Group Therapy Note: Pt did not attend group therapy meeting.

## 2014-12-30 NOTE — Behavioral Health Treatment Team (Signed)
Shift assessment done see doc flow notes.AAOX4.depression rated 10/10,anxiety rated 10/10,pain 0/10.Stated that,"things would be better if I wasn't around,they all just want my money anyway."no plan of suicide,comits to safety.Denies h/i and ,avh.

## 2014-12-30 NOTE — Progress Notes (Signed)
Patient c.o nervousness requests xanax for nerves. Dr. Ramiro HarvestParikh notified. Orders received for Ativan 2 mg po, refused. States : "Ativan makes me nervous. Only xanax helps me. " I asked Dr. Ramiro HarvestParikh if she could have xanax. States no. Patient states:  "I'd rather have nothing than an Ativan."

## 2014-12-30 NOTE — Other (Addendum)
Discharge Planning Note: Pt. Signed a release for [facility] [phone]. Will need to make an appointment during business hours.  Pt. States she does not have a PCP, so pt. Signed a release for Dr. Riki RuskKimberly Baldock [phone] as a new patient. Need to make an appointment during business hours.
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
## 2014-12-30 NOTE — Behavioral Health Treatment Team (Signed)
Became agitated today about not being able to have a long housecoat brought in,explained rules on bh unit on clothing ,offered layers and long sleeved  clothing,walked out and slammed door,anxiety rated 10/10,emotional support and ativan 2 mg.given.

## 2014-12-30 NOTE — Other (Signed)
[facility]  INPATIENT BEHAVIORAL HEALTH SERVICES  MASTER TREATMENT PLAN    Patient: Amber EisenmengerDebra Sue Hellinger  DOB: 03-29-1966  MRN: [id number]    Date of Admission: 12/29/2014     Date Plan Initiated: 12/30/2014  Time Plan Initiated: 1418    Anticipated Date of Discharge: 01/03/15  Legal Status: Voluntary    Admission Diagnosis: Suicidal ideation    ASSESSMENTS    Assessments Completed: Suicide Risk Assessment: 15, Aggression: 8      INVENTORY OF PATIENT STRENGTHS/LIMITATIONS  (S=Strength/L=Limitations)    Verbal Expression: Strength   Family Support: Limitation  Financial Status: Strength    Physical Status: Limitation  Social Supports: Limitation    Employment Status: Limitation  Degree of Insight: Limitation   Education/Cognition: Strength  Ambulation/Transport: Strength   Motivation: Strength  Prior Response to Treatment: Strength        INITIAL DISCHARGE PLAN    Living Arrangements: Pt. Lives at home with her husband    Behavioral Health Continuum of Care: Pt. Signed a release for [facility] in [place], [place] and plans to follow up there    Medical Continuum of Care: Pt. Signed a release for Dr. Riki RuskKimberly Baldock in [place], [place] as a new patient                  Problem(s):    Problem # Problem Date Established Status Comments   1 Suicide/Homicidal Ideations 12/29/14 Active Pt. Not currently homicidal, continues to have SI   2 Depressed Mood 12/29/14 Active    3   Psychosis 12/29/14 Monitoring Visual and auditory hallucinations   4 Hypertension 12/29/14 Active    6 Nutrition Deficit 12/29/14 Active        MASTER TREATMENT PLAN MODALITIES    Type of Modality: Community and wrap up meetings to encourage peer interactions  20 minutes 1 x daily.    Group psychotherapy to assist in building coping skills and internal controls 50 minutes 1 x daily.    Physician medication management 15 minutes 1 x daily.    Psychoeducation in groups or indvidual setting  50 minutes 1 x daily.      and Therapeutic activity groups to build leisure and coping skills 60 minutes 1 x daily.    Patient Involvement in Treatment Plan: Treatment Plan was developed with input from and discussed with patient/guardian/conservator/designee? Yes, with individual staff    Family/Significant Other Involvement in Treatment Plan: Family involved, limited contact    Patient Goal for treatment: "Get back into a functioning world. Get back on medications."    Family Goal (in family's own words):      PATIENT/GUARDIAN STATEMENT    The treatment plan has been developed with input from me and I have been given the opportunity to participate in planning care during my hospitalization.  I agree to the implementation of the Master Treatment Plan.    Patient Signature: ___________________________________Date/Time: ___________     Patient did not sign: _______________    Reason patient did not sign:  __________________________       Guardian/Conservator (If patient is not able to sign for self):  ______________________ Reviewed with Guardian/Conservator by telephone? ___________    (Note: requires two staff witnesses)    Signature: _________________________________________Date/Time:  ___________       Signature: _________________________________________Date/Time:  ___________       TREATMENT TEAM SIGNATURES/COMPLETED DATE    Nursing Staff Signature       Printed Name & Credential Date/Time  Social Work/Therapist Training and development officer Name & Systems analyst       Printed Name & Credential Date/Time   Other Signature       Printed Name & Credential Date/Time       PHYSICIAN CERTIFICATION OF THE LEVEL OF CARE: I certify that this patient's inpatient psychiatric hospital admission is medically necessary for treatment which can reasonably be expected to improve the patient's condition and/or for diagnostic study.     Physician Signature Printed Name & Credential Date/Time

## 2014-12-30 NOTE — Progress Notes (Signed)
Problem: Suicide/Homicide (Adult/Pediatric)  Goal: *STG: Remains safe in hospital  Patient has remained safe in hospital.   Outcome: Progressing Towards Goal  Patient will remain safe in hospital.   Goal: *STG: Seeks staff when feelings of self harm or harm towards others arise  Patient has sought staff with feelings.   Outcome: Progressing Towards Goal  Patient does seek staff with feelings.   Goal: *STG/LTG: No longer expresses self destructive or suicidal/homicidal thoughts  Patient commits to safety.   Outcome: Progressing Towards Goal  Patient commits to safety.   Goal: *LTG: Identifies available community resources  Patient has family.   Outcome: Progressing Towards Goal  Patient has a husband.   Goal: *LTG: Develops proactive suicide prevention plan  Patient commits to safety.   Outcome: Progressing Towards Goal  Patient commits to safety.   Goal: Interventions  Patient commits to safety.   Outcome: Progressing Towards Goal  Patient commits to safety.     Problem: Patient Education: Go to Patient Education Activity  Goal: Patient/Family Education  Patient educated to seek staff with feelings. Verbalized understanding.   Outcome: Progressing Towards Goal  Patient seeks staff with feelings     Problem: Hypertension  Goal: *Blood pressure within specified parameters  Patient will have a blood pressure less than 150 systolic or 100 diastolic   Outcome: Progressing Towards Goal  147/89  Goal: *Fluid volume balance  Patient will have normal elastic skin turgor and drink quantity sufficient of water.   Outcome: Progressing Towards Goal  Patient has elastic skin turgor.   Goal: *Labs within defined limits  Patient labs within normal limits.   Outcome: Progressing Towards Goal  Patient's labs within defined limits.     Problem: Patient Education: Go to Patient Education Activity  Goal: Patient/Family Education  Educated patient to report high blood pressures. Verbalized understanding.    Outcome: Progressing Towards Goal  Patient educated to report high blood pressures, verbalized understanding.
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
## 2014-12-31 MED ORDER — DULOXETINE 30 MG CAP, DELAYED RELEASE
30 mg | Freq: Every day | ORAL | Status: DC
Start: 2014-12-31 — End: 2015-01-03
  Administered 2014-12-31 – 2015-01-03 (×5): via ORAL

## 2014-12-31 MED ORDER — RISPERIDONE 0.25 MG TAB
0.25 mg | Freq: Two times a day (BID) | ORAL | Status: DC
Start: 2014-12-31 — End: 2014-12-31
  Administered 2014-12-31 (×2): via ORAL

## 2014-12-31 MED ORDER — OXAZEPAM 15 MG CAP
15 mg | ORAL | Status: AC
Start: 2014-12-31 — End: 2014-12-31
  Administered 2014-12-31: 19:00:00 via ORAL

## 2014-12-31 MED ORDER — RISPERIDONE 1 MG TAB
1 mg | Freq: Two times a day (BID) | ORAL | Status: DC
Start: 2014-12-31 — End: 2015-01-01
  Administered 2015-01-01 (×2): via ORAL

## 2014-12-31 MED FILL — LOSARTAN 50 MG TAB: 50 mg | ORAL | Qty: 2

## 2014-12-31 MED FILL — LEVOTHYROXINE 25 MCG TAB: 25 mcg | ORAL | Qty: 1

## 2014-12-31 MED FILL — BYSTOLIC 5 MG TABLET: 5 mg | ORAL | Qty: 1

## 2014-12-31 MED FILL — LORAZEPAM 2 MG TAB: 2 mg | ORAL | Qty: 1

## 2014-12-31 MED FILL — RISPERIDONE 0.25 MG TAB: 0.25 mg | ORAL | Qty: 2

## 2014-12-31 MED FILL — OXAZEPAM 15 MG CAP: 15 mg | ORAL | Qty: 1

## 2014-12-31 MED FILL — DULOXETINE 30 MG CAP, DELAYED RELEASE: 30 mg | ORAL | Qty: 1

## 2014-12-31 MED FILL — HYDROCHLOROTHIAZIDE 25 MG TAB: 25 mg | ORAL | Qty: 1
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
## 2014-12-31 NOTE — Behavioral Health Treatment Team (Signed)
Shift Assessment Completed.  Patient in common area.  Alert and oriented times four.  Cooperative. Denies S/I, H/I, AVH, anxiety, and depression. Denies pain or discomfort.  No S/S of distress.  Will continue to monitor.

## 2014-12-31 NOTE — Progress Notes (Signed)
Problem: Suicide/Homicide (Adult/Pediatric)  Goal: *STG: Remains safe in hospital  Patient has remained safe in hospital.   Outcome: Progressing Towards Goal  Patient has remained safe in hospital.  Goal: *STG: Seeks staff when feelings of self harm or harm towards others arise  Patient has sought staff with feelings.   Outcome: Progressing Towards Goal  Patient does seek staff and shares feelings with them,  Goal: *STG/LTG: No longer expresses self destructive or suicidal/homicidal thoughts  Patient commits to safety.   Outcome: Progressing Towards Goal  Patient denies suicidal ideation when questioned.   Goal: *LTG: Identifies available community resources  Patient has family.   Outcome: Progressing Towards Goal  Patient has family husband seems supportive.   Goal: *LTG: Develops proactive suicide prevention plan  Patient commits to safety.   Outcome: Progressing Towards Goal  Patient commits to safety.   Goal: Interventions  Patient commits to safety.   Outcome: Progressing Towards Goal  Maintain a safe environment for patient by doing safety rounds and checks,.     Problem: Patient Education: Go to Patient Education Activity  Goal: Patient/Family Education  Patient educated to seek staff with feelings. Verbalized understanding.   Outcome: Progressing Towards Goal  Patient does seek staff with feelings.     Problem: Hypertension  Goal: *Blood pressure within specified parameters  Patient will have a blood pressure less than 150 systolic or 100 diastolic   Outcome: Progressing Towards Goal  Patient has a normal blood pressure of 93/63.  Goal: *Fluid volume balance  Patient will have normal elastic skin turgor and drink quantity sufficient of water.   Outcome: Progressing Towards Goal  Patient has a normal skin turgor and drinks a sufficient amount of water.   Goal: *Labs within defined limits  Patient labs within normal limits.   Outcome: Progressing Towards Goal  Labs within normal limits.      Problem: Patient Education: Go to Patient Education Activity  Goal: Patient/Family Education  Educated patient to report high blood pressures. Verbalized understanding.   Outcome: Progressing Towards Goal  Patient is normotensive.     Problem: Nutrition Deficit  Goal: *Optimize nutritional status  Outcome: Progressing Towards Goal  Patient ate more than 50 percent of meals.     Problem: Falls - Risk of  Goal: *Absence of falls  Outcome: Progressing Towards Goal  Patient has not fallen this admission.   Goal: *Knowledge of fall prevention  Outcome: Progressing Towards Goal  Patient verbalizes understanding about fall prevention.     Problem: Patient Education: Go to Patient Education Activity  Goal: Patient/Family Education  Outcome: Progressing Towards Goal  Educated to seek staff for help with ambulation when needed. Verbalizes understanding.

## 2014-12-31 NOTE — Behavioral Health Treatment Team (Addendum)
Account Number:  [id number]  Name:  Amber [name]  Admission Date:  12/29/2014  2:49 PM    INTERVAL HISTORY  CC:  "I'm feeling some better."  HPI:  Reports she has settled some. Complains of feeling anxious and having racing thoughts but did sleep better.  Still with periodic hallucinations.   Is tolerating risperdal pretty well.  Has been on several other mood stabilizers including depakote, tegretol, trileptal and lamictal.  Is unclear about f/u plans, has been discharge from numerous practices.  ROS:  No akathisia, no eps.    EXPANDED PROBLEM-FOCUSED EXAM    Patient Vitals for the past 24 hrs:   BP Temp Pulse Resp SpO2 Weight   12/31/14 1004 - - - - - 68.153 kg (150 lb 4 oz)   12/31/14 0727 115/76 mmHg 97.3 ??F (36.3 ??C) 66 18 97 % -   12/30/14 2032 93/63 mmHg 97.8 ??F (36.6 ??C) 79 18 99 % -     Up on unit, fairly cooperative with some pushing to go home.  Hygiene is okay.  Mood is stabilizing.  Affect actually isn't bad, with reasonable frustration tolerance.  Ox3  Currently denies si/hi but has periodic hallucinations and odd ideas.  Associations and processes intact.  Insight is limited.      MEDICAL DECISION MAKING  Patient Active Problem List    Diagnosis Date Noted   ??? Bipolar 1 disorder, mixed (HCC) 12/30/2014   ??? Suicidal ideation 12/29/2014   ??? Seizures (HCC) 07/20/2014   ??? HTN (hypertension) 07/20/2014   ??? GERD (gastroesophageal reflux disease) 07/20/2014   ??? Tobacco abuse counseling 05/18/2014   ??? Hyperlipidemia 05/18/2014   ??? Lumbar pain with radiation down left leg 05/18/2014   ??? COPD (chronic obstructive pulmonary disease) (HCC) 03/24/2014   ??? Hypothyroidism 03/24/2014   ??? Tobacco abuse 03/24/2014   ??? Homicidal ideation 08/06/2013   ??? Hypokalemia 03/24/2013   ??? Fracture of ankle, bimalleolar, left, closed 03/23/2013   ??? Essential hypertension, benign 03/23/2013   ??? Anxiety state 03/23/2013     Class: Acute       BMP: No results found for: NA, K, CL, CO2, AGAP, GLU, BUN, CREA, GFRAA, GFRNA   CMP: No results found for: NA, K, CL, CO2, AGAP, GLU, BUN, CREA, GFRAA, GFRNA, CA, MG, PHOS, ALB, TBIL, TP, ALB, GLOB, AGRAT, SGOT, ALT, GPT  CBC: No results found for: WBC, HGB, HGBEXT, HCT, HCTEXT, PLT, PLTEXT, HGBEXT, HCTEXT, PLTEXT    Current Facility-Administered Medications   Medication Dose Route Frequency   ??? LORazepam (ATIVAN) tablet 2 mg  2 mg Oral Q6H PRN   ??? levothyroxine (SYNTHROID) tablet 125 mcg  125 mcg Oral ACB   ??? DULoxetine (CYMBALTA) capsule 30 mg  30 mg Oral DAILY   ??? risperiDONE (RisperDAL) tablet 0.5 mg  0.5 mg Oral BID   ??? nebivolol (BYSTOLIC) tablet 5 mg  5 mg Oral DAILY   ??? losartan (COZAAR) tablet 100 mg  100 mg Oral DAILY   ??? hydrochlorothiazide (HYDRODIURIL) tablet 12.5 mg  12.5 mg Oral DAILY       Nursing and therapy notes reviewed from previous 24 hours.    Discussion and Changes to Treatment Plan:  Increase risperdal to  bid to stabilize mood.  Discussed trying lithium, but patient is on diuretic which makes it use problematic.  Will see if we can get by on just an atypical.    Will provide one time dose of serax for anxiety.  On hold.  Not suitable for discharge at this time.    Signed by:  Elizebeth BrookingScott Rosselyn Martha, MD

## 2015-01-01 MED ORDER — RISPERIDONE 1 MG TAB
1 mg | Freq: Every evening | ORAL | Status: DC
Start: 2015-01-01 — End: 2015-01-03
  Administered 2015-01-02 – 2015-01-03 (×2): via ORAL

## 2015-01-01 MED ORDER — DIVALPROEX 125 MG TAB, DELAYED RELEASE
125 mg | Freq: Three times a day (TID) | ORAL | Status: DC
Start: 2015-01-01 — End: 2015-01-03
  Administered 2015-01-01 – 2015-01-03 (×7): via ORAL

## 2015-01-01 MED ORDER — RISPERIDONE 1 MG TAB
1 mg | Freq: Every day | ORAL | Status: DC
Start: 2015-01-01 — End: 2015-01-03
  Administered 2015-01-02 – 2015-01-03 (×3): via ORAL

## 2015-01-01 MED FILL — LOSARTAN 50 MG TAB: 50 mg | ORAL | Qty: 2

## 2015-01-01 MED FILL — DULOXETINE 30 MG CAP, DELAYED RELEASE: 30 mg | ORAL | Qty: 1

## 2015-01-01 MED FILL — DIVALPROEX 125 MG TAB, DELAYED RELEASE: 125 mg | ORAL | Qty: 1

## 2015-01-01 MED FILL — LORAZEPAM 2 MG TAB: 2 mg | ORAL | Qty: 1

## 2015-01-01 MED FILL — LEVOTHYROXINE 25 MCG TAB: 25 mcg | ORAL | Qty: 1

## 2015-01-01 MED FILL — RISPERIDONE 1 MG TAB: 1 mg | ORAL | Qty: 1

## 2015-01-01 MED FILL — BYSTOLIC 5 MG TABLET: 5 mg | ORAL | Qty: 1

## 2015-01-01 MED FILL — HYDROCHLOROTHIAZIDE 25 MG TAB: 25 mg | ORAL | Qty: 1

## 2015-01-01 NOTE — Behavioral Health Treatment Team (Signed)
Verbal shift change report given to Amada Jupiterale, RN (oncoming nurse) by Ellwood SayersPam,RN (offgoing nurse). Report included the following information SBAR.

## 2015-01-01 NOTE — Behavioral Health Treatment Team (Signed)
Patient attended nursing education group on "Dual Diagnosis."

## 2015-01-01 NOTE — Other (Signed)
Family Contact:    Therapist contacted Patient's husband for family contact. Husband stated that patient was on xanax for 10 years, then "lost her doctor" after patient's niece became angry with patient for refusing to sell xanax to her, and called doctor to claim patient had been selling the xanax. Husband stated that patient tries to help her niece, who continually gets in trouble (drugs, shoplifting, etc.). Husband then stated, "She still don't have a doctor. She'll go and get xanax off the street" Husband reported that he and patient have frequent arguments regarding his alcohol use and her drug use.     Patient's husband stated that [facility] wouldn't prescribe medicine for her. "She doesn't want to take Paxil, or anything like that." Patient's husband stated that patient only wants Xanax. "She just thinks she needs to be on Xanax for some reason." Patient's husband stated that she does not take Xanax as prescribed when it is prescribed.     Patient's husband stated that he would bring clothes for her. No additional concerns were presented. Husband was encouraged to call back with additional concerns as needed.

## 2015-01-01 NOTE — Progress Notes (Signed)
Problem: Suicide/Homicide (Adult/Pediatric)  Goal: *STG: Remains safe in hospital  Patient has remained safe in hospital.   Outcome: Progressing Towards Goal  Remains safe in hospital  Goal: *STG: Seeks staff when feelings of self harm or harm towards others arise  Patient has sought staff with feelings.   Outcome: Progressing Towards Goal  Seeks staff when having feelings of self harm or harm toward others  Goal: *STG/LTG: No longer expresses self destructive or suicidal/homicidal thoughts  Patient commits to safety.   Outcome: Progressing Towards Goal  No longer expresses self destruction or suicidal/homicidal thoughts  Goal: *LTG: Develops proactive suicide prevention plan  Patient commits to safety.   Outcome: Progressing Towards Goal  Pt commits to safety    Problem: Hypertension  Goal: *Blood pressure within specified parameters  Patient will have a blood pressure less than 150 systolic or 100 diastolic   Outcome: Progressing Towards Goal  Blood pressure within parameters  Goal: *Fluid volume balance  Patient will have normal elastic skin turgor and drink quantity sufficient of water.   Outcome: Progressing Towards Goal  Normal skin turgor  Goal: *Labs within defined limits  Patient labs within normal limits.   Outcome: Progressing Towards Goal  Normal labs    Problem: Patient Education: Go to Patient Education Activity  Goal: Patient/Family Education  Educated patient to report high blood pressures. Verbalized understanding.   Outcome: Progressing Towards Goal  Pt educated

## 2015-01-01 NOTE — Progress Notes (Signed)
Problem: Suicide/Homicide (Adult/Pediatric)  Goal: *STG: Remains safe in hospital  Patient has remained safe in hospital.   Outcome: Progressing Towards Goal  Remains safe in hospital  Goal: *STG: Seeks staff when feelings of self harm or harm towards others arise  Patient has sought staff with feelings.   Outcome: Progressing Towards Goal  Seeks staff when needed  Goal: *STG/LTG: No longer expresses self destructive or suicidal/homicidal thoughts  Patient commits to safety.   Outcome: Progressing Towards Goal  Pat commits to safety  Goal: *LTG: Identifies available community resources  Patient has family.   Outcome: Progressing Towards Goal  Pt has family  Goal: *LTG: Develops proactive suicide prevention plan  Patient commits to safety.   Outcome: Progressing Towards Goal  Commits to safety    Problem: Patient Education: Go to Patient Education Activity  Goal: Patient/Family Education  Patient educated to seek staff with feelings. Verbalized understanding.   Outcome: Progressing Towards Goal  Verbalizes understanding to seek staff    Problem: Hypertension  Goal: *Blood pressure within specified parameters  Patient will have a blood pressure less than 150 systolic or 100 diastolic   Outcome: Progressing Towards Goal  Blood pressure within parameters  Goal: *Fluid volume balance  Patient will have normal elastic skin turgor and drink quantity sufficient of water.   Outcome: Progressing Towards Goal  Normal skin turgor  Goal: *Labs within defined limits  Patient labs within normal limits.   Outcome: Progressing Towards Goal  Pt labs within normal limits    Problem: Patient Education: Go to Patient Education Activity  Goal: Patient/Family Education  Educated patient to report high blood pressures. Verbalized understanding.   Outcome: Progressing Towards Goal  Pt understands to report high blood pressures

## 2015-01-01 NOTE — Other (Signed)
[facility]  [facility]  [address]  [place], [place]  [id number]  Amber EisenmengerDebra Sue [name]  [birth year]    Date of service: 01/01/2015    Start time: 1415  Stop time: 1500    Type of session: Psychoeducational    Problem number: 1    Short term goal (STG): [facility]: Accepting what we can't change, changing what we can    Intervention/techniques: Validated/Supported, Reframed and Listened/Empathized    Patient mental status/affect: Calm    Patient behavior/appearance: Other: entering and leaving group, smiling    Special patient treatment accommodations provided (describe): none    Patient response and progress towards goals: Patient presented late for group, was attentive for a while, then left without participating in group discussion      Keri A Kitchen    Clinician Signature:_____________________________________________    Clinician PRINTED name & Credential______________________________    Date:________________Time:________________
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
## 2015-01-01 NOTE — Behavioral Health Treatment Team (Signed)
Morning assessment complete. Pt is up on the unit at this time. Pt is alert and oriented x 4 with good eye contact. Pt denies any thoughts of suicide or homicide at this time. Pt reports sleeping well. Pt denies any pain and vitals are within normal limits. Pt voiced no complaints or requests at this time. Will continue to monitor per unit policy.

## 2015-01-01 NOTE — Behavioral Health Treatment Team (Signed)
Shift Assessment Completed.  Patient in common area.  Alert and oriented times four.  Cooperative. Patient reports anxiety and depression.  Denies S/I, H/I, AVH. Denies pain or discomfort.  No S/S of distress.  Will continue to monitor.

## 2015-01-02 MED ORDER — NICOTINE 10 MG INHALATION CARTRIDGE
10 mg | RESPIRATORY_TRACT | Status: DC | PRN
Start: 2015-01-02 — End: 2015-01-03
  Administered 2015-01-02 – 2015-01-03 (×2): via RESPIRATORY_TRACT

## 2015-01-02 MED FILL — BYSTOLIC 5 MG TABLET: 5 mg | ORAL | Qty: 1

## 2015-01-02 MED FILL — LORAZEPAM 2 MG TAB: 2 mg | ORAL | Qty: 1

## 2015-01-02 MED FILL — LEVOTHYROXINE 25 MCG TAB: 25 mcg | ORAL | Qty: 1

## 2015-01-02 MED FILL — LOSARTAN 50 MG TAB: 50 mg | ORAL | Qty: 2

## 2015-01-02 MED FILL — NICOTROL 10 MG INHALATION CARTRIDGE: 10 mg | RESPIRATORY_TRACT | Qty: 1

## 2015-01-02 MED FILL — DULOXETINE 30 MG CAP, DELAYED RELEASE: 30 mg | ORAL | Qty: 1

## 2015-01-02 MED FILL — HYDROCHLOROTHIAZIDE 25 MG TAB: 25 mg | ORAL | Qty: 1

## 2015-01-02 MED FILL — RISPERIDONE 1 MG TAB: 1 mg | ORAL | Qty: 2

## 2015-01-02 MED FILL — DIVALPROEX 125 MG TAB, DELAYED RELEASE: 125 mg | ORAL | Qty: 1

## 2015-01-02 MED FILL — RISPERIDONE 1 MG TAB: 1 mg | ORAL | Qty: 1

## 2015-01-02 NOTE — Progress Notes (Signed)
Problem: Suicide/Homicide (Adult/Pediatric)  Goal: *STG: Remains safe in hospital  Patient has remained safe in hospital.   Outcome: Progressing Towards Goal  Remains free from injury        Goal: *STG: Seeks staff when feelings of self harm or harm towards others arise  Patient has sought staff with feelings.   Outcome: Progressing Towards Goal  Denies thoughts of self harm or harm from others.  Goal: Interventions  Patient commits to safety.   Outcome: Progressing Towards Goal  Assess each shift and implement as deemed appropriate.    Problem: Falls - Risk of  Goal: *Absence of falls  Outcome: Progressing Towards Goal  Remains free from falls.

## 2015-01-02 NOTE — Behavioral Health Treatment Team (Signed)
Psychiatry Progress Note    Date: 01/02/2015  Account Number:  [id number]  Name: Amber [name]      Subjective:     Patient seen. She reports that depakote is helping her. She said she prefers the small pills or liquid form because she has problem with gaging to the large pills. She reports that her mood is better. She slept better last night. She denies suicidal thought. She denies homicidal thought. She denies side effect to her medication. She denies hearing voices. She still has the racing thoughts. She lives with her husband.     Patient Active Problem List    Diagnosis Date Noted   ??? Bipolar 1 disorder, mixed (HCC) 12/30/2014   ??? Suicidal ideation 12/29/2014   ??? Seizures (HCC) 07/20/2014   ??? HTN (hypertension) 07/20/2014   ??? GERD (gastroesophageal reflux disease) 07/20/2014   ??? Tobacco abuse counseling 05/18/2014   ??? Hyperlipidemia 05/18/2014   ??? Lumbar pain with radiation down left leg 05/18/2014   ??? COPD (chronic obstructive pulmonary disease) (HCC) 03/24/2014   ??? Hypothyroidism 03/24/2014   ??? Tobacco abuse 03/24/2014   ??? Homicidal ideation 08/06/2013   ??? Hypokalemia 03/24/2013   ??? Fracture of ankle, bimalleolar, left, closed 03/23/2013   ??? Essential hypertension, benign 03/23/2013   ??? Anxiety state 03/23/2013     Class: Acute     Past Surgical History   Procedure Laterality Date   ??? Delivery c-section     ??? Hx cholecystectomy       lap chole   ??? Hx tubal ligation       lap hysterectomy   ??? Hx other surgical       thyroidectomy april 2012   ??? Hx other surgical       C/section with stillborn - under general anesthesia   ??? Hx gi       colonoscopy, EGD   ??? Hx orthopaedic       trimalleolar fx repair 03/23/13      Allergies   Allergen Reactions   ??? Topamax [Topiramate] Anaphylaxis   ??? Ultram [Tramadol] Itching   ??? Nasal Spray [Sodium Chloride] Other (comments)     Migraine nasal spray makes her throat bleed   ??? Aspirin Other (comments)     Swelling "knot on side of neck"   ??? Ciprofloxacin Other (comments)      Causes facial redness     ??? Norvasc [Amlodipine] Hives   ??? Tamsulosin Swelling   ??? Levsin [Hyoscyamine Sulfate] Other (comments)     Blurred vision     ??? Pcn [Penicillins] Nausea and Vomiting      History   Substance Use Topics   ??? Smoking status: Current Every Day Smoker -- 2.00 packs/day for 27 years   ??? Smokeless tobacco: Never Used   ??? Alcohol Use: No      Family History   Problem Relation Age of Onset   ??? Arthritis-osteo Mother    ??? Cancer Mother    ??? Migraines Mother    ??? Headache Mother    ??? Heart Disease Mother    ??? Heart Disease Father    ??? Asthma Sister    ??? Lung Disease Brother    ??? Arthritis-osteo Maternal Grandmother    ??? Cancer Maternal Grandmother    ??? Diabetes Maternal Grandmother    ??? Hypertension Maternal Grandmother    ??? Stroke Maternal Grandmother    ??? Breast Cancer Maternal Grandmother    ??? Hypertension  Maternal Grandfather    ??? Alcohol abuse Neg Hx    ??? Bleeding Prob Neg Hx    ??? Elevated Lipids Neg Hx    ??? Psychiatric Disorder Neg Hx    ??? Mental Retardation Neg Hx       Lori D McCoy, DO        Objective:         Patient Vitals for the past 8 hrs:   BP Temp Pulse Resp SpO2 Height Weight   01/02/15 1954 90/52 mmHg 97.7 ??F (36.5 ??C) 96 16 97 % - -   01/02/15 1546 (!) 89/57 mmHg 97.8 ??F (36.6 ??C) 77 18 96 % - -   01/02/15 1404 - - - - - 5\' 5"  (1.651 m) 68.04 kg (150 lb)         Mental Status exam:     Pt appears stated age, kempt and casually dressed. Fair eye contact.  Psychomotor neutral. AAO X 3. Fair memory, concentration & attention span  Speech is articulated, normal rate, volume and quantity  Mood is anxious and affect is appropriate  TP is goal directed. TC No delusion, No suicidal/homicidal ideation, intent or plan  Perception, no AVH. Average intelligence and fund of knowledge  Impulse control, Insight and judgement is limited        Therapy notes nursing notes and labs in the past 24 hours reviewed and appreciated    Assessment/Plan:   Principal Problem:     Bipolar 1 disorder, mixed (HCC) (12/30/2014)    Active Problems:    Essential hypertension, benign (03/23/2013)      Anxiety state (03/23/2013)      Homicidal ideation (08/06/2013)      COPD (chronic obstructive pulmonary disease) (HCC) (03/24/2014)      Hypothyroidism (03/24/2014)      Tobacco abuse (03/24/2014)      Tobacco abuse counseling (05/18/2014)      Hyperlipidemia (05/18/2014)      Lumbar pain with radiation down left leg (05/18/2014)      Seizures (HCC) (07/20/2014)      Suicidal ideation (12/29/2014)          Assessment    Patient is doing better on her current medication. She feels hopeful. She was pleasant. She will need a CMP and family conference. She was offered step down to [facility] but she refused.     Changes In Treatment Plan, Potential Side Effect and Benefit and alternatives discussed.The following information was reviewed and discussed:   the risks and benefits of the proposed medication   patient given opportunity to ask questions   off label use of an approved drug/prescription discussed with patient     Treatment Plan  CMP  Family Conference with Husband( Can be done over the phone)   Therapeutic Milieu  DVT Prophylaxis Ambulation  Medications:    Current Facility-Administered Medications   Medication Dose Route Frequency   ??? nicotine (NICOTROL) inhalation cartridge 10 mg  10 mg Inhalation PRN   ??? risperiDONE (RisperDAL) tablet 1 mg  1 mg Oral DAILY   ??? risperiDONE (RisperDAL) tablet 2 mg  2 mg Oral QHS   ??? divalproex DR (DEPAKOTE) tablet 125 mg  125 mg Oral TID   ??? LORazepam (ATIVAN) tablet 2 mg  2 mg Oral Q6H PRN   ??? levothyroxine (SYNTHROID) tablet 125 mcg  125 mcg Oral ACB   ??? DULoxetine (CYMBALTA) capsule 30 mg  30 mg Oral DAILY   ??? nebivolol (BYSTOLIC) tablet  5 mg  5 mg Oral DAILY   ??? losartan (COZAAR) tablet 100 mg  100 mg Oral DAILY   ??? hydrochlorothiazide (HYDRODIURIL) tablet 12.5 mg  12.5 mg Oral DAILY           Signed By: Conard Novak, MD

## 2015-01-02 NOTE — Other (Signed)
[facility]  Group Note  [address] [place]  [address]    Date of service: 01/02/2015    Start time: 1430  Stop time: 1510    Type of session: Psychoeducational    Short term goal (STG): Identify causes of stress, find healthy coping skills to deal with stress    Intervention/techniques: Validated/Supported, Guided, Challenged, Listened/Empathized, Queired/Probed, Clarified and Provided Feedback    Patient mental status/affect: Congruent    Patient behavior/appearance: Attentive and Cooperative    Special patient treatment accommodations provided (describe): none    Patient response and progress towards goals: participated when prompted       KELLI J FAULKNER    Clinician Signature:_______Kelli Faulkner,LPCC______________________________________    Clinician PRINTED name & Credential______________________________    Date:______1/18/16__________Time:___1430_____________

## 2015-01-02 NOTE — Behavioral Health Treatment Team (Signed)
Evening assessment completed. Pt is up on the unit at this time. Pt is A&Ox 3 with good eye contact. Pt denies any thoughts of SI/HI and reports no AH/VH at this time. Pt reports sleeping well. Pt vitals are WNL  Pt states for possible discharge tomorrow. Pt voiced no complaints or requests at this time. Will continue to closely assess per unit policy.

## 2015-01-02 NOTE — Other (Signed)
Discharge Planning    [phone].     Therapist made follow-up appointment for Monday, 1/25 at 10:20am

## 2015-01-02 NOTE — Progress Notes (Signed)
Problem: Nutrition Deficit  Goal: *Optimize nutritional status  Outcome: Progressing Towards Goal  Nutrition follow up: Assessment completed. Pt denies wt changes (note hx reporting 56# wt loss x 1 mo) however ? Pt report d/t dx bipolar, trait personality d/o. UBW 150# x past 6 mo. Wt stated admit 147#, 148# per previous encounter 10/19/14 consistent with wt 150# today. PO intake 75-100% on regular diet, note 10% BF this am. Pt with IBS, diarrhea is chronic, does not avoid specific foods. Monitor need for supp/PO.

## 2015-01-02 NOTE — Behavioral Health Treatment Team (Signed)
Shift assessment completed. Alert and oriented x 3. Reports anxiety 10/10 and depression 3/10. Denies  suicidal thoughts or homicidal thoughts. Denies any hallucinations. Commits to safety. Medication taken cooperative . Continue to monitor

## 2015-01-02 NOTE — Other (Signed)
Discharge Planning Note: [facility] Comp for 1/25 at 3 pm with Misty. Crisis hotline provided. Attempted to schedule appt with Dr. Dorthea CoveBaldock, not currently accepting new pts at this time.

## 2015-01-02 NOTE — Other (Signed)
[facility]  [address]  [place], [place] [id number]        Date of service: 01/02/15    Start time: 1pm  Stop time: 2pm    Type of session: Activity    Problem number: Therapeutic Recreation: Pt attended recreation/activity group and participated in playing [place] and checkers with RT and other pt's. Pt interacted well with other group members. Enjoyed activity and voiced no problems or concerns.    Short term goal (STG): Pt will attend two recreation/activity groups a day    Intervention/techniques: Observed/Monitored    Patient mental status/affect: Calm; Cooperative    Patient behavior/appearance: Neatly Groomed    Special patient treatment accommodations provided (describe): None needed    Patient response and progress towards goals: Pt is responding towards goal by attending recreation/activity groups      Lance B. Sydnor

## 2015-01-02 NOTE — Behavioral Health Treatment Team (Signed)
Report given to Lee Penix RN

## 2015-01-02 NOTE — Other (Signed)
[facility]  Group Note  [address] [place] [address]        Date of service: 01/02/15    Start time: 3:10pm  Stop time: 4pm    Type of session: Activity    Problem number: Therapeutic Recreation: Pt attended recreation/activity group and participated in playing checkers with other pt's. Pt interacted well with other group members. Enjoyed activity and voiced no problems or concerns.    Short term goal (STG): Pt will attend two recreation/activity groups a day    Intervention/techniques: Observed/Monitored    Patient mental status/affect: Calm; Cooperative    Patient behavior/appearance: Neatly Groomed    Special patient treatment accommodations provided (describe): None needed    Patient response and progress towards goals: Pt is responding towards goal by attending recreation/activity groups      Lance B. Sydnor

## 2015-01-02 NOTE — Behavioral Health Treatment Team (Signed)
Verbal shift change report given to Brett CanalesSteve, RN  (oncoming nurse) by Elita QuickPam, RN  (offgoing nurse). Report included the following information SBAR.

## 2015-01-03 ENCOUNTER — Encounter: Attending: Gastroenterology | Primary: Family Medicine

## 2015-01-03 LAB — METABOLIC PANEL, COMPREHENSIVE
A-G Ratio: 1 — ABNORMAL LOW (ref 1.2–2.2)
ALT (SGPT): 18 U/L (ref 12–78)
AST (SGOT): 16 U/L (ref 15–37)
Albumin: 3.6 g/dL (ref 3.4–5.0)
Alk. phosphatase: 115 U/L (ref 45–117)
Anion gap: 6 mmol/L (ref 6–15)
BUN/Creatinine ratio: 20 (ref 7–25)
BUN: 19 MG/DL — ABNORMAL HIGH (ref 7–18)
Bilirubin, total: 0.3 MG/DL (ref <1.1)
CO2: 31 mmol/L (ref 21–32)
Calcium: 8.5 MG/DL (ref 8.5–10.1)
Chloride: 99 mmol/L (ref 98–107)
Creatinine: 0.97 MG/DL (ref 0.60–1.30)
GFR est AA: >60 mL/min/{1.73_m2} (ref >60)
GFR est non-AA: >60 mL/min/{1.73_m2} (ref >60)
Globulin: 3.5 g/dL (ref 2.4–3.5)
Glucose: 95 mg/dL (ref 70–110)
Potassium: 4.7 mmol/L (ref 3.5–5.3)
Protein, total: 7.1 g/dL (ref 6.4–8.2)
Sodium: 136 mmol/L (ref 136–145)

## 2015-01-03 LAB — VALPROIC ACID: Valproic acid: 38 ug/ml — ABNORMAL LOW (ref 50–100)

## 2015-01-03 MED ORDER — LOSARTAN 100 MG TAB
100 mg | ORAL_TABLET | Freq: Every day | ORAL | Status: AC
Start: 2015-01-03 — End: 2015-01-17

## 2015-01-03 MED ORDER — DULOXETINE 30 MG CAP, DELAYED RELEASE
30 mg | ORAL_CAPSULE | Freq: Every day | ORAL | Status: AC
Start: 2015-01-03 — End: 2015-01-17

## 2015-01-03 MED ORDER — RISPERIDONE 1 MG TAB
1 mg | ORAL_TABLET | Freq: Every day | ORAL | Status: AC
Start: 2015-01-03 — End: 2015-01-17

## 2015-01-03 MED ORDER — DIVALPROEX 125 MG TAB, DELAYED RELEASE
125 mg | ORAL_TABLET | Freq: Three times a day (TID) | ORAL | Status: AC
Start: 2015-01-03 — End: 2015-01-17

## 2015-01-03 MED ORDER — NEBIVOLOL 5 MG TAB
5 mg | ORAL_TABLET | Freq: Every day | ORAL | Status: AC
Start: 2015-01-03 — End: 2015-01-17

## 2015-01-03 MED ORDER — DIVALPROEX 125 MG TAB, DELAYED RELEASE
125 mg | Freq: Three times a day (TID) | ORAL | Status: DC
Start: 2015-01-03 — End: 2015-01-03
  Administered 2015-01-03: 23:00:00 via ORAL

## 2015-01-03 MED ORDER — HYDROCHLOROTHIAZIDE 12.5 MG TAB
12.5 mg | ORAL_TABLET | Freq: Every day | ORAL | Status: AC
Start: 2015-01-03 — End: 2015-01-17

## 2015-01-03 MED ORDER — RISPERIDONE 2 MG TAB
2 mg | ORAL_TABLET | Freq: Every evening | ORAL | Status: AC
Start: 2015-01-03 — End: 2015-01-17

## 2015-01-03 MED FILL — DULOXETINE 30 MG CAP, DELAYED RELEASE: 30 mg | ORAL | Qty: 1

## 2015-01-03 MED FILL — RISPERIDONE 1 MG TAB: 1 mg | ORAL | Qty: 2

## 2015-01-03 MED FILL — DIVALPROEX 125 MG TAB, DELAYED RELEASE: 125 mg | ORAL | Qty: 1

## 2015-01-03 MED FILL — LOSARTAN 50 MG TAB: 50 mg | ORAL | Qty: 2

## 2015-01-03 MED FILL — LORAZEPAM 2 MG TAB: 2 mg | ORAL | Qty: 1

## 2015-01-03 MED FILL — LEVOTHYROXINE 25 MCG TAB: 25 mcg | ORAL | Qty: 1

## 2015-01-03 MED FILL — BYSTOLIC 5 MG TABLET: 5 mg | ORAL | Qty: 1

## 2015-01-03 MED FILL — NICOTROL 10 MG INHALATION CARTRIDGE: 10 mg | RESPIRATORY_TRACT | Qty: 1

## 2015-01-03 MED FILL — HYDROCHLOROTHIAZIDE 25 MG TAB: 25 mg | ORAL | Qty: 1

## 2015-01-03 MED FILL — RISPERIDONE 1 MG TAB: 1 mg | ORAL | Qty: 1

## 2015-01-03 NOTE — Other (Signed)
[facility]  Group Note  [address] [place] [address]        Date of service: 01/03/15    Start time: 3pm  Stop time: 4pm    Type of session: Activity    Problem number: Therapeutic Recreation: Pt attended recreation/activity group and watched the movie "The Fast and the Furious". Pt interacted well with other group members. Enjoyed activity and voiced no problems or concerns.    Short term goal (STG): Pt will attend two recreation/activity groups a day    Intervention/techniques: Observed/Monitored    Patient mental status/affect: Calm; Cooperative    Patient behavior/appearance: Neatly Groomed    Special patient treatment accommodations provided (describe): None needed    Patient response and progress towards goals: Pt is responding towards goal by attending recreation/activity groups      Lance B. Sydnor

## 2015-01-03 NOTE — Other (Signed)
[facility]  [address]  [place], [place]  [id number]  Remigio EisenmengerDebra Sue Avalos  1966/11/29      Date of service:01/03/2015    Start time: 1030  Stop time: 1125  Type of session: Psychoeducational ??  Short term goal (STG): Identify triggers for relapse to include substance abuse and mental health issues.     Intervention/techniques: Informed, Reflected, Guided, Prompted/Cued, Observed/Monitored and Provided Feedback    Patient mental status/affect: Calm    Patient behavior/appearance: Neatly Groomed, Attentive and Cooperative    Special patient treatment accommodations provided (describe): none    Patient response and progress towards goals:The patient progressing toward goal of group attendance. Patient listened and only answered when prompted.She left group early.     LISA Eula ListenM GOODAN, M.Ed., [facility]

## 2015-01-03 NOTE — Other (Signed)
[facility]  Group Note  [address]  [place], [place]?? [address]    Date of service: 01/03/15    Start time: 1430  Stop time: 1510    Type of session: Psychotherapy    Short term goal (STG): Patients identified their fear and group members provided peer advice for coping with that specific fear    Intervention/techniques: Validated/Supported, Reflected, Guided, Challenged, Listened/Empathized, Promoted Peer Support, Reinforced and Provided Feedback    Patient mental status/affect: Congruent    Patient behavior/appearance: Attentive and Cooperative    Special patient treatment accommodations provided (describe): none    Patient response and progress towards goals: Pt met group attendance goal       KELLI J FAULKNER    Clinician Signature:______Kelli Faulkner,LPCC_______________________________________    Clinician PRINTED name & Credential______________________________    Date:___1/19.16_____________Time:___1430_____________      ??????   ????   ??   ???? ????   ????     ????   ??

## 2015-01-03 NOTE — Progress Notes (Signed)
Discharge instructions explained. Pt verbalized understanding. Discharge instructions, prescriptions, and belongings given to patient. Ambulated off the unit in good condition, escorted by staff.

## 2015-01-03 NOTE — Behavioral Health Treatment Team (Signed)
[place]   TREATMENT PLAN REVIEW    Patient: Amber [name]  DOB: [birth year]  MRN: [id number]    Review Date: 01/03/2015    Treatment Plan Review    Summary of ongoing issues with symptoms/functional impairments indicating need for continued stay: Pt is still experiencing racing thoughts but Depakote is helping with mood.      Treatment & Discharge Planning Changes    Modality or Intervention Changes: none    Psychotropic Medication Changes: Risperdal 1 mg  and Depakote 125 mg    Discharge Planning and ELOS Changes: Therapist scheduled appt for pt at [facility]. Comp for 1/25 at 3 pm with Misty.    New Issues: None    Treatment Plan Review-Specific Goal Progress  Goal Status Codes: M = Met, C = Continued, D/C = Discontinued, R = Revised, N = New Goal     Problem Number : 1 Problem: Suicide/ homicide   STG Goal  Status Comments (Progress of Lack of Progress)   Remains safe in hospital   m Pt has remained free of injurys during stay   Seeks staff when feelings of self harm or harm towards others arise   m Pt has been communicating to staff feelings of harm   No longer expresses self destructive or suicidal/homicidal thoughts   m Pt no longer expresses self destructive behavior or thoughts   Develops proactive suicide prevention plan   c Still working with pt to establish the importance of suicide prevention plan       Problem Number : 2 Problem: Hypertension   STG Goal  Status Comments (Progress of Lack of Progress)   Blood pressure within specified parameters   c Pt BP is currently 90/ 52   Fluid volume balance   c Intake and output are being observed per shift   Labs within defined limits   c RBC and HCT are low            Problem Number : 3 Problem: Nutrition   STG Goal  Status Comments (Progress of Lack of Progress)   Optimize nutritional status   c Pt is continuing to improve intake                      Problem Number : 4 Problem: Falls   STG Goal   Status Comments (Progress of Lack of Progress)   Absence of falls   m Pt has been free of falls   Knowledge of fall prevention   m Pt understands the importance of fall prevention measures                   PATIENT/GUARDIAN STATEMENT    I have reviewed revisions and/or additions to my treatment plan.  I have been given the opportunity to participate in planning my care and asking questions of the staff, and have received explanations regarding treatment.    Patient Signature: ___________________________________Date/Time: ___________     Reason refused/unable: ______________________________    Guardian/Conservator (if not able to sign for self):  ______________________     Reviewed with Guardian/Conservator by telephone? ___________    (Note: requires two staff witnesses)    Signature: _________________________________________Date/Time:  ___________       Signature: _________________________________________Date/Time:  ___________       TREATMENT TEAM SIGNATURES/COMPLETED DATE    Nursing Staff Signature       Printed Name & Credential Date/Time   Social  Work/Therapist Training and development officer Name & Credential Date/Time   Activity Therapy Staff Signature       Printed Name & Credential Date/Time   Other Signature       Printed Name & Credential Date/Time         Physician Re-certification of the Level of Care    - I certify that the inpatient psychiatric services furnished since the previous certification were, and continue to be, medically necessary  for either, treatment which could reasonably be expected to improve the patient???s condition, or for diagnostic study, and that the hospital records indicate that the services furnished were,  either intensive treatment services, admission and related services necessary for diagnostic study , or equivalent services.    - I certify that the patient continues to need, on a daily basis, active treatment furnished directly by, or requiring supervision of inpatient  psychiatric facility personnel. I estimate ___ days/___ weeks of hospitalization are necessary for proper treatment of the patient. My plans for post-hospital care for this patient are           Physician Signature Printed Name & Credential Date/Time

## 2015-01-03 NOTE — Other (Signed)
Therapeutic Recreation Note: No scheduled 1pm Recreation group due to RT teaching CPI class.

## 2015-01-03 NOTE — Progress Notes (Signed)
Problem: Suicide/Homicide (Adult/Pediatric)  Goal: Interventions  Patient commits to safety.   Outcome: Progressing Towards Goal  Interventions have been implemented by nursing staff as appropriate this shift

## 2015-01-03 NOTE — Progress Notes (Signed)
Pt C/O increased anxiety. Medicated per MAR. Waiting for depakote level lab results.

## 2015-01-03 NOTE — Progress Notes (Signed)
Amber [name] had a new patient appointment with Amber [name] on 01/03/2015 at 9:15am. Amber [name] was a no show for this appointment.        ref by Dr Amber [name]; chronic diarrhea, screening colon  rs from 1/7 [facility] in endo

## 2015-01-06 ENCOUNTER — Encounter: Attending: Family Medicine | Primary: Family Medicine

## 2015-01-06 NOTE — Discharge Summary (Signed)
Already dictated. [id number]

## 2015-01-06 NOTE — Discharge Summary (Signed)
[name]      PT Name:  Amber [name], Amber [name]            Admitted:  12/29/2014  MR#:  [id number]  Account #:  [id number]            DOB:  1966-09-20  Dictator:  Read Drivers, MD   Age:  49      DISCHARGE SUMMARY    [place]:  The patient reports having suicidal thoughts with plans to blow her  brains onto the ground.  Also, homicidal thoughts towards her  husband.  A Duty To Warn was performed.    HISTORY AND PHYSICAL:  Please refer to typed History and Physical on electronic medical  record performed by this writer dated the 15th of January 2016 for  details of Chief Complaint, History of Present Illness, Past  Psychiatric History, Past Medical History, Personal and Social  History and Mental Status Examination on admission.    DSM-IV DIAGNOSIS ON ADMISSION:  Please refer to electronic medical record for details of this.    HOSPITAL COURSE:  The patient was admitted to the inpatient [facility] and  managed as a case of unspecified bipolar and other related disorder,  rule out major depressive disorder with psychotic features and  benzodiazepine use disorder.  The patient was placed on standard  monitoring and standard precautions.  The patient was placed on  standard p.r.n. orders.   The patient received group therapy and  participated in group and Unit activities.   The patient received  multidisciplinary team care including therapeutic milieu. When she  first came to the Unit she was very belligerent.  She was very  agitated, aggressive and very hostile.  The patient was loud and  threatening.  However, the patient was restarted back on her  medication and as soon as she was restarted on her medication she  became calmer.  She was cooperative, particularly when she was  placed on Depakote which seemed to help her a lot.   She showed  steady improvement with hospitalization.  She denied suicidal   thoughts and she denied homicidal thoughts.  She denied  hallucinations and she denied delusions.  She had mentioned  preference to snort her pills because she has problem with  swallowing because of her gagging which is chronic.    As a result  she was put on Depakote 125 mg tablet.   The patient responded well  to her medication adjustment.  She showed steady improvement during  hospitalization.   She was discussed regularly in staffing during  the week.   I met her husband and finally got the patient's consent  and a discussion was held with the husband.  He was informed of the  need for the patient to take her medication as prescribed and need  to ensure that the patient does not have any access to guns or  firearms.   The patient was subsequently discharged in good  condition.     The patient's husband verbalized adequate  understanding.   By this time the patient was already on Risperdone  1 mg q a.m. and 2 mg at bedtime and Depakote 125 mg three times  daily.   She was also to continue on her Cymbalta 30 mg p.o. daily  and all her general medications.  She responded well to the  medication adjustment.  Her mood became stable and she was pleasant  on approach.  She  denied suicidal thoughts and she denied homicidal  thoughts.  She verbalized feeling hopeful for the future.   The  patient adamantly denied any thoughts of harming herself or harming  anybody.  She reports her husband is being supportive and very  loving.  Complete metabolic panel was done and showed liver enzymes  were not elevated.   Also, prior to her leaving serum Depakote level  showed 38.  Depakote can be raised on an outpatient basis.    As the patient has met the goals of the treatment team the patient  was subsequently discharged in good condition.   She was  psychiatrically and medically stable at the time of discharge.  She  was not psychotic at the time of discharge and was capable of making   her own financial and medical decisions.  She verbalized adequate  understanding of treatment team recommendations, medications and  aftercare plan.  The patient was subsequently discharged in good  condition.    MENTAL STATUS EXAMINATION:  This is a young woman looking her stated age.  She is dressed  appropriate to weather with fair grooming and hygiene.  She is calm,  cooperative and appropriate in behavior.  Speech is spontaneous,  clear and coherent.  Her mood is stable.  Thoughts are logical and  goal directed.  She denies suicidal thoughts.  She denies homicidal  thoughts.  She denies hallucinations.  She denies delusions.  She  denies any paranoid or persecutory ideation.  She is alert, awake  and oriented to time, place and person.  She has good attention  span.  She has fairly good insight and judgment.    DSM DIAGNOSIS ON DISCHARGE:  AXIS I:   Unspecified bipolar and other related disorder.   Rule out  major depressive disorder with psychotic features.  Benzodiazepine  use disorder.  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  AXIS III:   History of hypertension, angina.  AXIS IV: Psychosocial stressors, conflict with her niece which is  ongoing and marital problems.  History of noncompliance with  medication, poor coping skills, poor problem solving skills.  AXIS V:  Current GAF of 55-60.    DISCHARGE INSTRUCTIONS:  Discharge diet is regular.   Discharge activity as tolerated.  Discharge follow up: For Psychiatry the patient will follow up with  [facility].  She was also given [facility] phone  number.  For Medical the patient will follow up with her primary  care physician.   The patient is to call 911 or go to the Emergency  Room for any recurrence of symptoms or if suicidal or homicidal.  The patient verbalized adequate understanding.  All precautions were  discontinued on discharge.    DISPOSITION:   The patient was discharged to her current place of residence where  she resides as she has met the goals of the treatment team.    DISCHARGE MEDICATIONS:  Please refer to electronic medical record for details of this.          __________________________________  Read Drivers, M.D.    AO:klg  DD: 01/06/2015 21:46:05  DT: 01/07/2015 10:21:12  Job ID:  [id number]  CC:      Document #:  [id number]

## 2015-01-09 ENCOUNTER — Encounter: Attending: Family Medicine | Primary: Family Medicine

## 2015-01-11 MED ORDER — LEVOTHYROXINE 150 MCG TAB
150 mcg | ORAL_TABLET | ORAL | Status: DC
Start: 2015-01-11 — End: 2015-08-28

## 2015-01-13 ENCOUNTER — Encounter: Attending: Family Medicine | Primary: Family Medicine

## 2015-02-24 NOTE — Telephone Encounter (Signed)
Patient called in wanting to see if you can send a referral for her to see a Psychiatrist. Please call patient [phone].

## 2015-02-28 NOTE — Telephone Encounter (Signed)
Called patient. [facility] and reports it was unsuccessful and is wanting a [facility] psych. [facility] if there excepting new patients.

## 2015-03-08 ENCOUNTER — Encounter

## 2015-03-11 ENCOUNTER — Encounter

## 2015-03-21 ENCOUNTER — Encounter: Attending: Neurology | Primary: Family Medicine

## 2015-03-30 ENCOUNTER — Ambulatory Visit
Admit: 2015-03-30 | Discharge: 2015-03-30 | Payer: PRIVATE HEALTH INSURANCE | Attending: Neurology | Primary: Family Medicine

## 2015-03-30 DIAGNOSIS — M25472 Effusion, left ankle: Secondary | ICD-10-CM

## 2015-03-30 MED ORDER — GABAPENTIN 300 MG CAP
300 mg | ORAL_CAPSULE | Freq: Three times a day (TID) | ORAL | Status: DC
Start: 2015-03-30 — End: 2015-06-29
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
## 2015-03-30 NOTE — Patient Instructions (Signed)
As a valued patient, you will be receiving a survey from [employer].  We encourage you to share your thoughts and opinions about the care you received today.  Thank you for choosing [facility].  [facility]  Office working hours are Monday - Thursday 9:00 am to 5:00 pm.  Friday 8 am to 12 noon with limited staff.  Office phone number is [phone] and fax is [phone].  For non-emergent medical care and clinical advice during office hours:   1. Call office or   2.  Send message or request using MyChart  For non-emergent medical care and clinical advice after office hours:  1.  Call [phone] or  2.  Send message or request using MyChart  Emergency care can be obtained at the [facility] ER, Urgent Care or calling 911.    Patient Satisfaction Survey  We appreciate you giving us your e-mail address.  Please watch for our patient satisfaction survey which you will receive by e-mail.  We strive to provide you with the best care possible.  We respect all comments and will take comments into consideration to improve our service.  Thank you for your participation.

## 2015-04-05 NOTE — Telephone Encounter (Signed)
Patient sees Dr. Ramiro HarvestParikh.

## 2015-06-29 ENCOUNTER — Ambulatory Visit
Admit: 2015-06-29 | Discharge: 2015-06-29 | Payer: PRIVATE HEALTH INSURANCE | Attending: Neurology | Primary: Family Medicine

## 2015-06-29 DIAGNOSIS — G250 Essential tremor: Secondary | ICD-10-CM

## 2015-06-29 MED ORDER — GABAPENTIN 300 MG CAP
300 mg | ORAL_CAPSULE | Freq: Three times a day (TID) | ORAL | Status: DC
Start: 2015-06-29 — End: 2015-12-19

## 2015-06-29 MED ORDER — CLONAZEPAM 0.5 MG TAB
0.5 mg | ORAL_TABLET | Freq: Every evening | ORAL | Status: DC | PRN
Start: 2015-06-29 — End: 2015-08-01

## 2015-06-29 NOTE — Patient Instructions (Signed)
As a valued patient, you will be receiving a survey from [employer].  We encourage you to share your thoughts and opinions about the care you received today.  Thank you for choosing [facility].  [facility]  Office working hours are Monday - Thursday 9:00 am to 5:00 pm.  Friday 8 am to 12 noon with limited staff.  Office phone number is [phone] and fax is [phone].  For non-emergent medical care and clinical advice during office hours:   1. Call office or   2.  Send message or request using MyChart  For non-emergent medical care and clinical advice after office hours:  1.  Call [phone] or  2.  Send message or request using MyChart  Emergency care can be obtained at the [facility] ER, Urgent Care or calling 911.    Patient Satisfaction Survey  We appreciate you giving us your e-mail address.  Please watch for our patient satisfaction survey which you will receive by e-mail.  We strive to provide you with the best care possible.  We respect all comments and will take comments into consideration to improve our service.  Thank you for your participation.
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
## 2015-07-25 MED ORDER — LOSARTAN-HYDROCHLOROTHIAZIDE 100 MG-12.5 MG TAB
ORAL_TABLET | ORAL | Status: DC
Start: 2015-07-25 — End: 2015-08-28

## 2015-08-01 ENCOUNTER — Encounter

## 2015-08-01 MED ORDER — CLONAZEPAM 0.5 MG TAB
0.5 mg | ORAL_TABLET | Freq: Every evening | ORAL | 0 refills | Status: DC | PRN
Start: 2015-08-01 — End: 2015-08-28

## 2015-08-28 ENCOUNTER — Ambulatory Visit
Admit: 2015-08-28 | Discharge: 2015-08-28 | Payer: PRIVATE HEALTH INSURANCE | Attending: Family Medicine | Primary: Family Medicine

## 2015-08-28 ENCOUNTER — Encounter

## 2015-08-28 DIAGNOSIS — E031 Congenital hypothyroidism without goiter: Secondary | ICD-10-CM

## 2015-08-28 DIAGNOSIS — I10 Essential (primary) hypertension: Secondary | ICD-10-CM

## 2015-08-28 MED ORDER — LOSARTAN-HYDROCHLOROTHIAZIDE 100 MG-12.5 MG TAB
ORAL_TABLET | ORAL | 5 refills | Status: DC
Start: 2015-08-28 — End: 2015-12-19

## 2015-08-28 MED ORDER — LEVOTHYROXINE 150 MCG TAB
150 mcg | ORAL_TABLET | ORAL | 3 refills | Status: DC
Start: 2015-08-28 — End: 2015-08-28

## 2015-08-28 MED ORDER — CLONAZEPAM 0.5 MG TAB
0.5 mg | ORAL_TABLET | Freq: Every evening | ORAL | 0 refills | Status: DC | PRN
Start: 2015-08-28 — End: 2015-09-20

## 2015-08-28 NOTE — Progress Notes (Signed)
Venipuncture x 1. One green and one lavender top tube obtained.

## 2015-08-28 NOTE — Patient Instructions (Signed)
MyChart Activation    Thank you for requesting access to MyChart. Please follow the instructions below to securely access and download your online medical record. MyChart allows you to send messages to your doctor, view your test results, renew your prescriptions, schedule appointments, and more.    How Do I Sign Up?    1. In your internet browser, go to [email or web address]  2. Click on the First Time User? Click Here link in the Sign In box. You will be redirect to the New Member Sign Up page.  3. Enter your MyChart Access Code exactly as it appears below. You will not need to use this code after you???ve completed the sign-up process. If you do not sign up before the expiration date, you must request a new code.    MyChart Access Code: [email or web address]  Expires: 09/27/2015  3:03 PM (This is the date your MyChart access code will expire)    4. Enter the last four digits of your Social Security Number (xxxx) and Date of Birth (mm/dd/yyyy) as indicated and click Submit. You will be taken to the next sign-up page.  5. Create a MyChart ID. This will be your MyChart login ID and cannot be changed, so think of one that is secure and easy to remember.  6. Create a MyChart password. You can change your password at any time.  7. Enter your Password Reset Question and Answer. This can be used at a later time if you forget your password.   8. Enter your e-mail address. You will receive e-mail notification when new information is available in MyChart.  9. Click Sign Up. You can now view and download portions of your medical record.  10. Click the Download Summary menu link to download a portable copy of your medical information.    Additional Information    If you have questions, please visit the Frequently Asked Questions section of the MyChart website at [email or web address] Remember, MyChart is NOT to be used for urgent needs. For medical emergencies, dial 911.    [facility]   Office working hours are Monday - Friday 8 am to 6 pm.   Office phone number is [phone] and fax is [phone].  For non-emergent medical care and clinical advice during office hours:   1. Call office or   2.  Send message or request by MyChart  For non-emergent medical care and clinical advice after office hours:  1.  Call [phone] or  2.  Send message or request by MyChart  Emergency care can be obtained at the [facility], Urgent Care or calling 911.    Patient Satisfaction Survey  We appreciate you giving [place] your e-mail address.  Please watch for our patient satisfaction survey which you will receive by e-mail.  We strive to provide you with the best care possible.  We respect all comments and will take comments into consideration to improve our service.  Thank you for your participation.

## 2015-08-28 NOTE — Progress Notes (Signed)
Progress Note    Patient: Amber [name]               Sex: female          MRN: [id number]       Date of Birth:  01-Feb-1966      Age:  49 y.o.              Subjective:     CC:    Chief Complaint   Patient presents with   ??? Hypertension     Patient is here for med refills she wants them in "paper form" and not sent to pharmacy.   ??? Hypothyroidism   ??? Cholesterol Problem       HTN: Stopped her BP meds about two months ago, just "didn't think I needed them."      She just received her health insurance, so has been lost to follow up for Psych.  She denies depression or mania.  Denies any SI or HI.  She is unable to remember the names of any of her meds.      Racing thoughts, trouble sleeping and eating, IBS, consistent with her bipolar diagnosis.  I instructed patient to call [facility], she absolutely needs to be on her psych meds.      Continues to smoke, is unrepentant about quitting.     Review of Systems:  Constitutional:  Denies weight changes or loss of appetite.  Denies fever, chills, or night sweats.  Eyes: No vision changes, blurry vision or excessively dry eyes.   ENT/Mouth:  No hearing changes, headaches or oral lesions.   Respiratory: No cough, shortness of breath or pleuritic pain.  Cardiovascular:  No chest pain, pressure, or palpitations.  GI:  Denies nausea, vomiting, diarrhea, or constipation. IBS symptoms.   GU:  No retention, incontinence or hematuria.   MSK:  Chronic back pain.   Skin: No rash or itching or skin changes.       Objective:      Visit Vitals   ??? BP 140/80   ??? Pulse 78   ??? Temp 98.1 ??F (36.7 ??C) (Oral)   ??? Resp 16   ??? Wt 142 lb (64.4 kg)   ??? LMP 07/27/2010   ??? SpO2 99%   ??? BMI 23.63 kg/m2       Physical Exam:  Constitutional:  Patient is dressed appropriately.    Eyes:   Conjunctivae pink, lids have normal appearance.  PERRL.  Anicteric sclerae.    ENT/Mouth: External ears and nose have normal appearance.  TMs clear bilaterally. Lips, teeth, gums, oropharynx have normal appearance.      Cardiovascular:  RRR, no M/R/G noted. No carotid bruits. No lower extremity edema.   Respiratory:   CTAB, no W/R/R noted.  Normal respiratory effort.    GI:   Soft, non-tender, non-distended, BS+.  No appreciable hepatosplenomegaly.   Musculoskeletal:  Walks with limp in exam room (this improves when walking out of room to check out window). Normal ROM of all extremities.  Normal muscle strength and tone.    Neuro:             AO x 3.  CN 2-12 intact.     Psych: Affect is abnormal, somewhat pressured speech.  Is up and down out of the chair several times during visit. Judgment and insight seem impaired.             Medications/Imaging/Labs Ordered or Reviewed:  As below.  Assessment/Plan     Amber [name] was seen today for hypertension, hypothyroidism and cholesterol problem.    Diagnoses and all orders for this visit:    Congenital hypothyroidism without goiter  -     TSH 3RD GENERATION; Future    Mixed hyperlipidemia  -     LIPID PANEL; Future    Tobacco abuse    Essential hypertension with goal blood pressure less than 140/90  -     losartan-hydrochlorothiazide (HYZAAR) 100-12.5 mg per tablet; TAKE ONE TABLET BY MOUTH DAILY  -     CBC WITH AUTOMATED DIFF; Future  -     METABOLIC PANEL, COMPREHENSIVE; Future    Chronic obstructive pulmonary disease, unspecified COPD type (HCC)    Bipolar 1 disorder, mixed (HCC)    Lumbar pain with radiation down left leg  -     REFERRAL TO NEUROSURGERY    Irritable bowel syndrome with diarrhea  -     REFERRAL TO GASTROENTEROLOGY    Other orders  -     Discontinue: levothyroxine (SYNTHROID) 150 mcg tablet; TAKE ONE TABLET BY MOUTH DAILY            Follow up in 6 months.         Risk and benefits of medications as well as alternative therapies discussed with patient who voiced understanding and agrees with treatment plan.    Tillie Fantasia, DO 3:44 PM

## 2015-08-29 ENCOUNTER — Inpatient Hospital Stay: Admit: 2015-08-29 | Payer: BLUE CROSS/BLUE SHIELD | Primary: Family Medicine

## 2015-08-29 LAB — METABOLIC PANEL, COMPREHENSIVE
A-G Ratio: 1.1 — ABNORMAL LOW (ref 1.2–2.2)
ALT (SGPT): 13 U/L (ref 12–78)
AST (SGOT): 15 U/L (ref 15–37)
Albumin: 4 g/dL (ref 3.4–5.0)
Alk. phosphatase: 95 U/L (ref 45–117)
Anion gap: 7 mmol/L (ref 6–15)
BUN/Creatinine ratio: 11 (ref 7–25)
BUN: 8 MG/DL (ref 7–18)
Bilirubin, total: 0.4 MG/DL (ref <1.1)
CO2: 26 mmol/L (ref 21–32)
Calcium: 8.8 MG/DL (ref 8.5–10.1)
Chloride: 109 mmol/L — ABNORMAL HIGH (ref 98–107)
Creatinine: 0.74 MG/DL (ref 0.60–1.30)
GFR est AA: >60 mL/min/{1.73_m2} (ref >60)
GFR est non-AA: >60 mL/min/{1.73_m2} (ref >60)
Globulin: 3.8 g/dL — ABNORMAL HIGH (ref 2.4–3.5)
Glucose: 96 mg/dL (ref 70–110)
Potassium: 3.8 mmol/L (ref 3.5–5.3)
Protein, total: 7.8 g/dL (ref 6.4–8.2)
Sodium: 142 mmol/L (ref 136–145)

## 2015-08-29 LAB — CBC WITH AUTOMATED DIFF
ABS. BASOPHILS: 0 10*3/uL (ref 0.0–0.1)
ABS. EOSINOPHILS: 0 10*3/uL (ref 0.0–0.5)
ABS. LYMPHOCYTES: 2 10*3/uL (ref 0.8–3.5)
ABS. MONOCYTES: 0.5 10*3/uL — ABNORMAL LOW (ref 0.8–3.5)
ABS. NEUTROPHILS: 4.3 10*3/uL (ref 1.5–8.0)
BASOPHILS: 0 % (ref 0–2)
EOSINOPHILS: 0 % (ref 0–5)
HCT: 41.2 % (ref 41–53)
HGB: 13.9 g/dL (ref 12.0–16.0)
LYMPHOCYTES: 30 % (ref 19–48)
MCH: 30.8 PG (ref 27–31)
MCHC: 33.7 g/dL (ref 31–37)
MCV: 91.4 FL (ref 80–100)
MONOCYTES: 7 % (ref 3–9)
MPV: 10.8 FL — ABNORMAL HIGH (ref 5.9–10.3)
NEUTROPHILS: 63 % (ref 40–74)
PLATELET: 174 10*3/uL (ref 130–400)
RBC: 4.51 M/uL (ref 4.2–5.4)
RDW: 13.6 % (ref 11.5–14.5)
WBC: 6.9 10*3/uL (ref 4.5–10.8)

## 2015-08-29 LAB — LIPID PANEL
Cholesterol, total: 215 MG/DL — ABNORMAL HIGH (ref <200)
HDL Cholesterol: 41 MG/DL (ref 32–96)
LDL, calculated: 135.44 MG/DL — ABNORMAL HIGH (ref <130)
Triglyceride: 241 MG/DL — ABNORMAL HIGH (ref <150)
VLDL, calculated: 48.2 MG/DL — ABNORMAL HIGH (ref 5–32)

## 2015-08-29 LAB — TSH 3RD GENERATION: TSH: 0.01 u[IU]/mL — ABNORMAL LOW (ref 0.35–3.74)

## 2015-08-30 ENCOUNTER — Encounter: Attending: Neurology | Primary: Family Medicine

## 2015-09-04 ENCOUNTER — Encounter

## 2015-09-04 NOTE — Progress Notes (Signed)
Insurance denied.

## 2015-09-18 ENCOUNTER — Inpatient Hospital Stay: Payer: BLUE CROSS/BLUE SHIELD | Attending: Family Medicine | Primary: Family Medicine

## 2015-09-18 NOTE — Telephone Encounter (Signed)
Patient called and said she went in for the mri and it was denied she didn't know what you wanted her to do. Can she be notified on what further action to take. Can be reached at [phone]

## 2015-09-20 ENCOUNTER — Ambulatory Visit
Admit: 2015-09-20 | Discharge: 2015-09-20 | Payer: PRIVATE HEALTH INSURANCE | Attending: Neurology | Primary: Family Medicine

## 2015-09-20 DIAGNOSIS — M25552 Pain in left hip: Secondary | ICD-10-CM

## 2015-09-20 MED ORDER — CLONAZEPAM 1 MG TAB
1 mg | ORAL_TABLET | Freq: Every evening | ORAL | 3 refills | Status: DC | PRN
Start: 2015-09-20 — End: 2015-12-19

## 2015-09-20 NOTE — Patient Instructions (Signed)
As a valued patient, you will be receiving a survey from [employer].  We encourage you to share your thoughts and opinions about the care you received today.  Thank you for choosing [facility].  [facility]  Office working hours are Monday - Thursday 9:00 am to 5:00 pm.  Friday 8 am to 12 noon with limited staff.  Office phone number is [phone] and fax is [phone].  For non-emergent medical care and clinical advice during office hours:   1. Call office or   2.  Send message or request using MyChart  For non-emergent medical care and clinical advice after office hours:  1.  Call [phone] or  2.  Send message or request using MyChart  Emergency care can be obtained at the [facility] ER, Urgent Care or calling 911.    Patient Satisfaction Survey  We appreciate you giving us your e-mail address.  Please watch for our patient satisfaction survey which you will receive by e-mail.  We strive to provide you with the best care possible.  We respect all comments and will take comments into consideration to improve our service.  Thank you for your participation.
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
## 2015-10-10 ENCOUNTER — Encounter: Attending: Gastroenterology | Primary: Family Medicine

## 2015-12-19 ENCOUNTER — Ambulatory Visit: Admit: 2015-12-19 | Discharge: 2015-12-19 | Attending: Neurology | Primary: Family Medicine

## 2015-12-19 DIAGNOSIS — M25552 Pain in left hip: Secondary | ICD-10-CM

## 2015-12-19 MED ORDER — CLONAZEPAM 1 MG TAB
1 mg | ORAL_TABLET | Freq: Every evening | ORAL | 3 refills | Status: DC | PRN
Start: 2015-12-19 — End: 2016-02-19
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
## 2015-12-19 NOTE — Patient Instructions (Signed)
[facility]  Office working hours are Monday - Thursday 9:00 am to 5:00 pm.  Friday 8 am to 12 noon with limited staff.  Office phone number is [phone] and fax is [phone].  For non-emergent medical care and clinical advice during office hours:   1. Call office or   2.  Send message or request using MyChart  For non-emergent medical care and clinical advice after office hours:  1.  Call [phone] or  2.  Send message or request using MyChart  Emergency care can be obtained at the [facility], Urgent Care or calling 911.    Patient Satisfaction Survey  We appreciate you giving us your e-mail address.  Please watch for our patient satisfaction survey which you will receive by e-mail.  We strive to provide you with the best care possible.  We respect all comments and will take comments into consideration to improve our service.  Thank you for your participation.  As a valued patient, you will be receiving a survey from [employer].  We encourage you to share your thoughts and opinions about the care you received today.  Thank you for choosing [facility].    MyChart Activation    Thank you for requesting access to MyChart. Please follow the instructions below to securely access and download your online medical record. MyChart allows you to send messages to your doctor, view your test results, renew your prescriptions, schedule appointments, and more.    How Do I Sign Up?    1. In your internet browser, go to [email or web address]  2. Click on the First Time User? Click Here link in the Sign In box. You will be redirect to the New Member Sign Up page.  3. Enter your MyChart Access Code exactly as it appears below. You will not need to use this code after you???ve completed the sign-up process. If you do not sign up before the expiration date, you must request a new code.    MyChart Access Code: [email or web address]   Expires: 03/18/2016 10:16 AM (This is the date your MyChart access code will expire)    4. Enter the last four digits of your Social Security Number (xxxx) and Date of Birth (mm/dd/yyyy) as indicated and click Submit. You will be taken to the next sign-up page.  5. Create a MyChart ID. This will be your MyChart login ID and cannot be changed, so think of one that is secure and easy to remember.  6. Create a MyChart password. You can change your password at any time.  7. Enter your Password Reset Question and Answer. This can be used at a later time if you forget your password.   8. Enter your e-mail address. You will receive e-mail notification when new information is available in MyChart.  9. Click Sign Up. You can now view and download portions of your medical record.  10. Click the Download Summary menu link to download a portable copy of your medical information.    Additional Information    If you have questions, please visit the Frequently Asked Questions section of the MyChart website at [email or web address] Remember, MyChart is NOT to be used for urgent needs. For medical emergencies, dial 911.

## 2016-02-19 ENCOUNTER — Encounter

## 2016-02-19 MED ORDER — CLONAZEPAM 1 MG TAB
1 mg | ORAL_TABLET | Freq: Every evening | ORAL | 3 refills | Status: DC | PRN
Start: 2016-02-19 — End: 2016-07-29

## 2016-03-19 ENCOUNTER — Encounter: Attending: Neurology | Primary: Family Medicine

## 2016-06-04 ENCOUNTER — Encounter: Attending: Neurology | Primary: Family Medicine

## 2016-07-09 ENCOUNTER — Encounter: Attending: Physician Assistant | Primary: Family Medicine

## 2016-07-15 ENCOUNTER — Encounter: Attending: Physician Assistant | Primary: Family Medicine

## 2016-07-25 ENCOUNTER — Inpatient Hospital Stay
Admit: 2016-07-25 | Discharge: 2016-07-29 | Disposition: A | Discharge status: Home / Self Care | Payer: Self-pay | Attending: Psychiatry | Admitting: Psychiatry

## 2016-07-25 DIAGNOSIS — F3160 Bipolar disorder, current episode mixed, unspecified: Principal | ICD-10-CM

## 2016-07-25 LAB — DRUG SCREEN, URINE
AMPHETAMINES: NEGATIVE
BARBITURATES: NEGATIVE
BENZODIAZEPINES: NEGATIVE
COCAINE: NEGATIVE
METHADONE: NEGATIVE
OPIATES: NEGATIVE
PCP(PHENCYCLIDINE): NEGATIVE
THC (TH-CANNABINOL): POSITIVE
TRICYCLICS: NEGATIVE

## 2016-07-25 LAB — CBC WITH AUTOMATED DIFF
ABS. BASOPHILS: 0 10*3/uL (ref 0.0–0.1)
ABS. EOSINOPHILS: 0 10*3/uL (ref 0.0–0.5)
ABS. LYMPHOCYTES: 2.9 10*3/uL (ref 0.8–3.5)
ABS. MONOCYTES: 0.6 10*3/uL — ABNORMAL LOW (ref 0.8–3.5)
ABS. NEUTROPHILS: 7.3 10*3/uL (ref 1.5–8.0)
BASOPHILS: 0 % (ref 0–2)
EOSINOPHILS: 0 % (ref 0–5)
HCT: 40.7 % — ABNORMAL LOW (ref 41–53)
HGB: 14.1 g/dL (ref 12.0–16.0)
LYMPHOCYTES: 27 % (ref 19–48)
MCH: 30.5 PG (ref 27–31)
MCHC: 34.6 g/dL (ref 31–37)
MCV: 88.1 FL (ref 80–100)
MONOCYTES: 6 % (ref 3–9)
MPV: 9.4 FL (ref 5.9–10.3)
NEUTROPHILS: 67 % (ref 40–74)
PLATELET: 152 10*3/uL (ref 130–400)
RBC: 4.62 M/uL (ref 4.2–5.4)
RDW: 12.9 % (ref 11.5–14.5)
WBC: 10.8 10*3/uL (ref 4.5–10.8)

## 2016-07-25 LAB — METABOLIC PANEL, COMPREHENSIVE
A-G Ratio: 1.1 — ABNORMAL LOW (ref 1.2–2.2)
ALT (SGPT): 15 U/L (ref 12–78)
AST (SGOT): 16 U/L (ref 15–37)
Albumin: 4.1 g/dL (ref 3.4–5.0)
Alk. phosphatase: 111 U/L (ref 45–117)
Anion gap: 11 mmol/L (ref 6–15)
BUN/Creatinine ratio: 15 (ref 7–25)
BUN: 13 MG/DL (ref 7–18)
Bilirubin, total: 0.5 MG/DL (ref <1.1)
CO2: 24 mmol/L (ref 21–32)
Calcium: 8.7 MG/DL (ref 8.5–10.1)
Chloride: 106 mmol/L (ref 98–107)
Creatinine: 0.86 MG/DL (ref 0.60–1.30)
GFR est AA: >60 mL/min/{1.73_m2} (ref >60)
GFR est non-AA: >60 mL/min/{1.73_m2} (ref >60)
Globulin: 3.8 g/dL — ABNORMAL HIGH (ref 2.4–3.5)
Glucose: 84 mg/dL (ref 70–110)
Potassium: 3.4 mmol/L — ABNORMAL LOW (ref 3.5–5.3)
Protein, total: 7.9 g/dL (ref 6.4–8.2)
Sodium: 141 mmol/L (ref 136–145)

## 2016-07-25 LAB — GLUCOSE, RANDOM: Glucose: 86 mg/dL (ref 70–110)

## 2016-07-25 LAB — LIPID PANEL
Cholesterol, total: 222 MG/DL — ABNORMAL HIGH (ref <200)
HDL Cholesterol: 43 MG/DL (ref 32–96)
LDL, calculated: 149.88 MG/DL — ABNORMAL HIGH (ref <130)
Triglyceride: 182 MG/DL — ABNORMAL HIGH (ref <150)
VLDL, calculated: 36.4 MG/DL — ABNORMAL HIGH (ref 5–32)

## 2016-07-25 LAB — SALICYLATE: Salicylate level: 4.5 MG/DL (ref 2.8–20.0)

## 2016-07-25 LAB — ETHYL ALCOHOL: ALCOHOL(ETHYL),SERUM: 22 MG/DL

## 2016-07-25 LAB — ACETAMINOPHEN: Acetaminophen level: <10 ug/mL — ABNORMAL LOW (ref 10–30)

## 2016-07-25 LAB — MAGNESIUM: Magnesium: 1.7 mg/dL — ABNORMAL LOW (ref 1.8–2.4)

## 2016-07-25 MED ORDER — NICOTINE 21 MG/24 HR DAILY PATCH
21 mg/24 hr | Freq: Every day | TRANSDERMAL | Status: DC
Start: 2016-07-25 — End: 2016-07-25

## 2016-07-25 MED ORDER — ESTRADIOL 0.05 MG/24 HR WEEKLY TRANSDERM PATCH
0.05 mg/24 hr | TRANSDERMAL | Status: DC
Start: 2016-07-25 — End: 2016-07-29

## 2016-07-25 MED ORDER — TRAZODONE 150 MG TAB
150 mg | Freq: Every evening | ORAL | Status: DC | PRN
Start: 2016-07-25 — End: 2016-07-29

## 2016-07-25 MED ORDER — LEVOTHYROXINE 112 MCG TAB
112 mcg | Freq: Every day | ORAL | Status: DC
Start: 2016-07-25 — End: 2016-07-29
  Administered 2016-07-26 – 2016-07-29 (×4): via ORAL

## 2016-07-25 MED ORDER — ESTRADIOL 0.05 MG/24 HR SEMIWEEKLY TRANSDERM PATCH
0.05 mg/24 hr | TRANSDERMAL | Status: DC
Start: 2016-07-25 — End: 2016-07-25

## 2016-07-25 MED ORDER — DIVALPROEX 125 MG TAB, DELAYED RELEASE
125 mg | Freq: Three times a day (TID) | ORAL | Status: DC
Start: 2016-07-25 — End: 2016-07-29
  Administered 2016-07-25 – 2016-07-29 (×13): via ORAL

## 2016-07-25 MED ORDER — MAGNESIUM HYDROXIDE 2,400 MG/10 ML ORAL SUSP
2400 mg/10 mL | Freq: Four times a day (QID) | ORAL | Status: DC | PRN
Start: 2016-07-25 — End: 2016-07-29
  Administered 2016-07-29: 03:00:00 via ORAL

## 2016-07-25 MED ORDER — RISPERIDONE 1 MG TAB
1 mg | Freq: Two times a day (BID) | ORAL | Status: DC
Start: 2016-07-25 — End: 2016-07-29
  Administered 2016-07-26 – 2016-07-29 (×8): via ORAL

## 2016-07-25 MED ORDER — ALUM-MAG HYDROXIDE-SIMETH 400 MG-400 MG-40 MG/5 ML ORAL SUSP
400-400-40 mg/5 mL | ORAL | Status: DC | PRN
Start: 2016-07-25 — End: 2016-07-29

## 2016-07-25 MED ORDER — HYDROXYZINE PAMOATE 50 MG CAP
50 mg | Freq: Four times a day (QID) | ORAL | Status: DC | PRN
Start: 2016-07-25 — End: 2016-07-29
  Administered 2016-07-26 – 2016-07-29 (×6): via ORAL

## 2016-07-25 MED ORDER — LEVOTHYROXINE 25 MCG TAB
25 mcg | Freq: Every day | ORAL | Status: DC
Start: 2016-07-25 — End: 2016-07-25
  Administered 2016-07-25: 14:00:00 via ORAL

## 2016-07-25 MED ORDER — NICOTINE 21 MG/24 HR DAILY PATCH
21 mg/24 hr | Freq: Every day | TRANSDERMAL | Status: DC | PRN
Start: 2016-07-25 — End: 2016-07-29

## 2016-07-25 MED ORDER — DULOXETINE 30 MG CAP, DELAYED RELEASE
30 mg | Freq: Every day | ORAL | Status: DC
Start: 2016-07-25 — End: 2016-07-29
  Administered 2016-07-25 – 2016-07-29 (×5): via ORAL

## 2016-07-25 MED FILL — ESTRADIOL 0.05 MG/24 HR WEEKLY TRANSDERM PATCH: 0.05 mg/24 hr | TRANSDERMAL | Qty: 1

## 2016-07-25 MED FILL — VIVELLE-DOT 0.05 MG/24 HR TRANSDERMAL PATCH: 0.05 mg/24 hr | TRANSDERMAL | Qty: 1

## 2016-07-25 MED FILL — DULOXETINE 30 MG CAP, DELAYED RELEASE: 30 mg | ORAL | Qty: 1

## 2016-07-25 MED FILL — DIVALPROEX 125 MG TAB, DELAYED RELEASE: 125 mg | ORAL | Qty: 1

## 2016-07-25 MED FILL — NICOTINE 21 MG/24 HR DAILY PATCH: 21 mg/24 hr | TRANSDERMAL | Qty: 1

## 2016-07-25 MED FILL — LEVOTHYROXINE 25 MCG TAB: 25 mcg | ORAL | Qty: 1
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
## 2016-07-25 NOTE — ED Notes (Signed)
Pt placed in a gown, all belongings placed in bag. Will notify intake coordinator
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
## 2016-07-25 NOTE — Other (Signed)
[facility]  Group Note  [address]  [place], [place]?? [address]  ????  ????  ????  Date of service: 07/25/16  ????  Start time: 3pm  Stop time: 4pm  ????  Type of session: Recreational Activity Group/Combined Group  ????  Problem number: Bingo for Prizes Social  ????  Short term goal (STG): Pt will attend one recreation/activity group and play bingo and socialize appropriately with AT and other pt's  ????  Intervention/techniques: Observed/Monitored, Spun Bingo Wheel  ????  Patient mental status/affect: Calm  ????  Patient behavior/appearance: Neatly Groomed  ????  Special patient treatment accommodations provided (describe): None needed  ????  Patient response and progress towards goals: Pt is responding towards goal by attending bingo social and socializing appropriately with AT and other pt's.  ????  ????  Lance B. Sydnor

## 2016-07-25 NOTE — ED Notes (Signed)
Guard at bedside

## 2016-07-25 NOTE — ED Notes (Signed)
RN attempted to notify pt husband without success for car retrieval per pt request. Phone number [phone] Amber [name]

## 2016-07-25 NOTE — Behavioral Health Treatment Team (Signed)
Called [facility] [place] to verify patients home medications. [phone]. Pharmacist states patient is currently taking  klonipin 1mg   0 refills remaining, but this medication is a re-occurring med. Levothyroxine 112 mcg po daily with 2 refills.

## 2016-07-25 NOTE — Other (Signed)
Pt admitted to Acute Psych per Dr Micah NoelLance to  Room 106 bed 1. Report called to Unit, all information given to JAARSSusie, Charity fundraiserN. Routine admission orders, verify and order PTA medications, Suicide precautions
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
## 2016-07-25 NOTE — ED Notes (Signed)
TRANSFER - OUT REPORT:    Verbal report given to Susie RN on Amber [name]  being transferred to behavioral unit for routine progression of care       Report consisted of patient???s Situation, Background, Assessment and   Recommendations(SBAR).     Information from the following report(s) SBAR, ED Summary and Recent Results was reviewed with the receiving nurse.    Lines:       Opportunity for questions and clarification was provided.      Patient transported with:   Security

## 2016-07-25 NOTE — Other (Signed)
Per Danny LawlessSteve Zornes:     Patient is over income for Medicaid and is not disabled. Gave patient FAA.

## 2016-07-25 NOTE — ED Notes (Signed)
Intake Coordinator at bedside

## 2016-07-25 NOTE — Behavioral Health Treatment Team (Signed)
Pt spoke with this Clinical research associatewriter and Engineer, civil (consulting)Justin RN about wanting to change DNR status to full code, Dr Samuel Bouchelucas notified of pts wishes

## 2016-07-25 NOTE — ED Notes (Signed)
Pt to ED bed 10

## 2016-07-25 NOTE — Other (Signed)
[facility]  [address]  [place], [place] [id number]  ????  ????  ????  Date of service: 07/25/16  ????  Start time: 10am  Stop time: 11am  ????  Type of session: Recreational Activity Group  ????  Problem number: Addiction Recovery  ????  Short term goal (STG): Pt will watch the movie "28 Days" and learn about overcoming addiction and the obstacles that come with it.  ????  Intervention/techniques: Observed/Monitored  ????  Patient mental status/affect: Calm  ????  Patient behavior/appearance: Calm??Neatly Groomed  ????  Special patient treatment accommodations provided (describe): None needed  ????  Patient response and progress towards goals: Pt is responding towards goal by watching the movie "28 Days" and understand that recovery is difficult and there are obstacles but it is attainable.  ????  ????  Lance B. Sydnor

## 2016-07-25 NOTE — ED Notes (Signed)
RN assisted pt to restroom. Urine sample obtained et sent to lab

## 2016-07-25 NOTE — Other (Signed)
BEHAVIORAL HEALTH  ACTIVITY SPECIALIST ASSESSMENT    Admission Date: 07/25/16 D/X: Suicidal Behavior     Evaluation Date: 07/25/2016 Evaluation Time: 0900 Precautions: None     Physical Function ADL: self-feeding, grooming, bathing, upper body dressing, lower body dressing, toileting, toilet transfer, shower transfer, tub transfer and simple home management Ambulatory: Yes     Place "X" next to selection.  Assistive Function [ ]   Walker [ ]  Cane [ ]  Wheelchair [ ]  Other     Place "X" next to selection.  Following Directions Attention Span Motivation Level   [ ]  Unable to follow simple commands. [ ]  5 Minutes or Less [ ]  Maximum encouragement needed.   [x ] Able to follow simple commands. [ ]  5-15 Minutes [ ]  Moderate encouragement needed.   [ ]  Able to follow multi-step commands. [x ] 15-30 Minutes [x ] Minimum encouragement needed.    [ ]  60 Minutes or More [ ]  No encouragement needed.     Patient's Living Situation: With spouse    Past Interests & Hobbies Current Interests & Hobbies   1.Play with Barbie dolls 1.Shoot pool   2. 2.Dancing   3. 3.Watch TV     What does the patient do to cope with presenting problem (i.e. Stress, Depression, Trigger, etc.): Smoke a cigarette    Place "X" next to selection; Specify Leisure Activities & Barriers.  Enjoyed Leisure Activities Barriers to Enjoyed Leisure Activities   [x ] Social:  [ ]  Mobility:    [x ] Solitary:  [ ]  Endurance:    [x ] Physical:  [ ]  Coordination:    [ ]  Creative:  [ ]  Flexibility:    [ ]  Outdoor:  [x ] Finance:    [ ]  Spectator:  [ ]  Time Availability:    [x ] Passive:  [ ]  Transportation:    [ ]  Other:  [ ]  Cognitive:     [ ]  Physical Disability:     [x ] Other: Education     Does patient believe these activities are beneficial to their overall well-being: Yes    Place "X" next to selection.  Motivators for Recreation/Leisure Involvement     [ ]  Relaxation [x ] Fun/Entertainment   [ ]  Physical Benefits [ ]  Intellectual Stimulation    [ ]  Social Interaction [ ]  Creative Expression   [ ]  Self Esteem or Sense of Accomplishment      Place "X" next to selection.  Patient Strengths & Areas of Potential     [ ]  Interests & Hobbies [ ]  Insight into Leisure Needs   [ ]  Reality Oriented [ ]  Motivated for Change   [ ]  Social Support [ ]  Good Physical Health   [ ]  Education [ ]  Positive Coping Skills     Place "X" next to selection  Patient Educational Needs     [ ]  Self-Esteem [ ]  Relaxation   [ ]  Depression [ ]  Social Skills   [ ]  Anger Control [ ]  Grief & Loss   [ ]  Leisure Awareness [ ]  Stress Management   [ ]  Leisure Education [ ]  Coping Skills     Are there any leisure activities the patient is interested in upon discharge: No: N/A. Explain: N/A    Arrangements staff can make to help facilitate these activities: N/A    Lance B. Sydnor  ______________________________  Activities Specialist  07/25/2016
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
## 2016-07-25 NOTE — ED Notes (Signed)
Pt states she has had three Bloody Mary's to drink tonight. Pt also reports she has not been taking her BP meds as prescribed d/t no insurance.

## 2016-07-25 NOTE — ED Notes (Signed)
Intake Coordinator spoke with RN. Pt at this time angry d/t not being allowed to go outside to smoke.

## 2016-07-25 NOTE — H&P (Signed)
H and P already dictated [phone]
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
## 2016-07-25 NOTE — Behavioral Health Treatment Team (Signed)
MTP1[facility]  [facility]  Inpatient [facility]al Health Services  INITIAL Treatment Plan        Date of Admit:  07/25/2016   Patient's Name    Amber EisenmengerDebra Sue Hark DOB:  1966-05-23 Date Plan Initiated:    07/25/2016 MRN:  [id number]     Admission Dx: Suicidal Ideations     Problem  Number PROBLEM  Include Significant Medical Issues Date Established Status      A = Active  D = Deferred  I = Inactive  R = Resolved  M = Monitoring   1. Depression  A   2 Anxiety  A   3 HTN  A   4 Hypothyroidism   A   5 IBS  A     Goals   Interventions/Modality         STG: Pt will remain safe for next 24 hours.    STG: Pt will verbalize all Suicidal and/or homicidal thoughts to staff for the next 24 hours.   STG: Pt will verbalize an understanding of possible symptoms and be able to identify at least 2 active symptoms.    STG: Pt will communicate the reason for admission and identify at least one goal for their treatment.    STG:Pt will verbalize an understanding of unit rules and expectations.    STG: Pt will communicate an understanding of one beginning symptoms of their Dx.     LTG: Pt will begin identifying at least one support system [employer](Community, family, peer, church official etc).   Nursing staff will monitored for safety including removing unsafe items, assessing current commitment for safety, and monitoring.    Nursing staff will provide medication as prescribed by physician at each prescribed time.    Nursing staff will complete vital assessment per shift to monitor medical symptoms.    Nursing staff will report symptoms assessed and identified during their shift at shift change to oncoming nurse.    Nursing staff will monitor observation type (1:1, elopment, etc)    Nursing staff will monitor precaution type (falls, suicide, etc)    Other:     Preliminary Discharge Plan  (outpatient, inpatient, substance abuse, mental health, etc):     Preliminary Discharge Location (return home, name of nursing home, rehab facility etc):  Return Home   Staff Initiating Care Plan: Doylene Canninganiel Bias RN
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
## 2016-07-26 MED ORDER — LORAZEPAM 0.5 MG TAB
0.5 mg | ORAL | Status: AC
Start: 2016-07-26 — End: 2016-07-26
  Administered 2016-07-26: 20:00:00 via ORAL

## 2016-07-26 MED FILL — RISPERIDONE 1 MG TAB: 1 mg | ORAL | Qty: 1

## 2016-07-26 MED FILL — DIVALPROEX 125 MG TAB, DELAYED RELEASE: 125 mg | ORAL | Qty: 1

## 2016-07-26 MED FILL — DULOXETINE 30 MG CAP, DELAYED RELEASE: 30 mg | ORAL | Qty: 1

## 2016-07-26 MED FILL — HYDROXYZINE PAMOATE 50 MG CAP: 50 mg | ORAL | Qty: 1

## 2016-07-26 MED FILL — LORAZEPAM 0.5 MG TAB: 0.5 mg | ORAL | Qty: 2

## 2016-07-26 MED FILL — NICOTINE 21 MG/24 HR DAILY PATCH: 21 mg/24 hr | TRANSDERMAL | Qty: 1

## 2016-07-26 MED FILL — LEVOTHYROXINE 112 MCG TAB: 112 mcg | ORAL | Qty: 1

## 2016-07-26 NOTE — Behavioral Health Treatment Team (Addendum)
[facility]  INPATIENT BEHAVIORAL HEALTH  NURSING PROGESS NOTE    Remigio EisenmengerDebra Sue Vogl  07/26/2016;  2336    BEHAVIORAL ASSESSMENT  Patient within line of sight this shift.  Patient in common area this shift watching tv and socializing with other patients.  Alert and oriented times four.  Good eye contact.  Good hygiene.  Speech is clear and coherent.  General attitude cooperataive.  Affect dull. Mood anxious.  Rates depression 4 out of 10.  Rates anxiety 7 out of 10.  Reports VH of shadows at home but not here.  Denies SI, HI, AH.  Patient commits to safety and states that she has no intention of harming herself or others.  Compliant with medications.    Sleep Assessment  Difficulty Falling Asleep: Yes  Difficulty Staying Asleep: Yes  Broken Sleep: Yes    Appetite Assessment  Percentage of Patient's Food Consumption: 100% of HS snack  Clinician's Observations: Tolerated Well    SUICIDE RE-ASSESSMENT  In the past 12 hours have you had thoughts of hurting yourself:  No  (If no when was the last time you have had thoughts of suicide):  Admission  Explain the thoughts (descriptive information):  Argument with husband before admission  What plan does the patient have:  None currently  On scale of 1-10 how does patient rate suicidal thoughts:  1     Nurses objective of patients reported suicidal thoughts:  1     On the nurses scale of 1-5 where does your patient rate 1= no suicidal ideations, 2= vague thoughts, 3= strong thoughts with no plan; 4= thoughts with plan low lethality high chance of rescue; 5= strong thoughts with plan and means to carry out plan high lethality low chance of rescue)      NURSING GROUP    TOPIC OF GROUP:  Daily Wrapup  Leader of Group:  Pam Rn  START TIME:  2000  STOP TIME:  2030  Patient Participation:  attended   Describe Patient's involvement in group:  Participated        INTERVENTIONS  Medication Orders/Changes by Physician this Shift: None     Treatment Plan Problem List & Correlating Goals/Interventions    Problem # Problem   1 depression   2 anxiety   3 HTN   4 hypothyroidism   5 IIBS       CLIENT RESPONSE TO TREATMENT    Describe Patient Response to Master Treatment Plan's Goals/Interventions:Accepting/Compliant    PLAN  1:1 provided therapeutic listening, interactive/commmunication with patient, and educated patient on coping skills to deal with harmful thoughts and impulses. Current coping skills assessed. Educated and encouraged patient to comply with treatment plan. Administered medications as prescribed. Teaching done on all medications administered. Maintained patient safety this shift

## 2016-07-26 NOTE — Behavioral Health Treatment Team (Signed)
[facility]  [facility]PAT[facility] HE[facility]  NURSING PROGRESS NOTE  Amber EisenmengerDebra Sue [name]  07/26/2016; 0720      BEHAVIORAL ASSESSMENT  Patient in lounge talking to other patients.  A/O x3.  Cooperative and pleasant.  Appearance is well-groomed.  Maintains eye contact and answers questions appropriately.  Denies SI.  States "I'm pissed, when I get out I want a divorce".  Speaking of marital conflict with husband.  Depression "I can't tell".  Anxiety 8/10.  Pt reports she has seen VH of "shadow people" in the recent past but denies at this time.  Pt claims to see dead people and states her door flew open last night for no apparent reason.  No s/s of distress.    Sleep Assessment  Hours nightly slept: 4  Difficulty Falling Asleep: Yes  Difficulty Staying Asleep: Yes  Broken Sleep: Yes      Appetite Assessment  Percentage of Patient's Food Consumption: 50%  Clinician's Observations:  No difficulty eating or swallowing    SUICIDE RE-ASSESSMENT  In the past 12 hours have you had thoughts of hurting yourself: No  On scale of 1-10 how does patient rate suicidal thoughts: 1     On the nurses scale of 1-5 where does your patient rate 1= no suicidal ideations, 2= vague thoughts, 3= strong thoughts with no plan; 4= thoughts with plan low lethality high chance of rescue; 5= strong thoughts with plan and means to carry out plan high lethality low chance of rescue)      NURSING GROUP:    TOPIC OF GROUP:  Self medication  Leader of Group: Donavan Foilale Jenkins, RN  START TIME: 1300  STOP TIME: 1335  Patient Participation:  Attended  Describe Patient's involvement in group: actively participated        INTERVENTIONS      Treatment Plan Problem List & Correlating Goals/Interventions    Problem # Problem    depression    anxiety    HTN    hypothyroidism    IBS       CLIENT RESPONSE TO TREATMENT    Describe Patient Response to Master Treatment Plan's Goals/Interventions:  Pt reporting anxiety and depression. HTN, hyopthyroidism and IBS treated per physicians order.

## 2016-07-26 NOTE — Treatment Summary (Signed)
[facility]  Our [facility]  Inpatient Behavioral Health Services  Master Treatment Plan      INPATIENT MASTER TREATMENT PLAN   Date of Adm:  07/25/2016   Signature/Credential of Staff Initiating:    Elmon Else, MSW, CSW Date Plan Initiated:    07/25/16 MTP plan initiated date/time:    07/26/16 Anticipated Date of Discharge:    07/30/16 Legal Status:   - Voluntary   - Involuntary   DIAGNOSIS   AXIS I: Unspecified Bipolar Disorder and related disorders             Rule out Major Depressive Disorder with psychotic features             History of Benzodiazepine Use Disorder   AXIS II: Cluster B Trait            Possibly Borderline Personality Disorder   AXIS III: History of hypertension, angina   AXIS IV:  Psychosocial Stressors:  Marital problems, history of non-compliance with medication and follow-up, poor coping skills, poor problem solving skills, chemical dependency (marijuana)   AXIS V:  Current GAF:  16 to 20    ASSESSMENTS     Suicide Risk Score:  High  Violence Risk:   11 Moderate  Alcohol (AUDIT) Score:  1 low  Tobacco: yes  Slum Score: n/a  Current Suicide Assessment: High     INVENTORY OF PATIENT STRENGTHS / LIMITATIONS       (At least 2 of each)S = Strength L = Limitation)   S L Item S L Item S L Item     Verbal Expression   Family Support   Financial Status     Physical Status   Social Supports   Employment Status     Degree of Insight   Education/Cognition   Prior Response to Treatment     Ambulation/Transportation   Motivation   Other:    DISCHARGE PLAN         Home alone   ??   Home with family spouse  ??   Home other than family ?????  ??   Group home ?????  ??   Assisted apartment living ?????  ??   Nursing home ?????  ??   Rest home ?????  ??   Assisted living ?????  ??   Family care home ?????  ??   Homeless shelter ?????  ??   Residential treatment ?????  ??   Shelter: ?????  ??   Correctional Facility: ?????  ??   Other hospital: ????? ??    [facility]  ??   Day treatment at ?????  ??   Individual therapy with ?????  ??   Family therapy with ?????  ??   Case management with ?????  ??   Substance abuse treatment   ??   Respite ?????  ??   Mental health center  ??   Vocational rehabilitation ?????  ??   Medication management with Dr. ?????  ??   Recovery Support Groups: ?????  ??   Other: ????? ??   Home health with ?????  ??   Home medical equipment ?????  ??   Medical services with Dr. ?????  ??   Nutritional needs : ?????  ??   Other Referrals:   ??     ??  Other Community Resource Needs/Referrals: (Describe Below):  ??   Educational needs :    ??   Recreational/Social  Referrals: ?????           PROBLEM LIST    Problem  Number PROBLEM  Include Significant Medical Issues Date Established Status As Evidenced by:         A = Active  D = Deferred  I = Inactive  R = Resolved  M = Monitoring    1 Suicidal Ideation 07/25/16 A As evidenced by, pt having a plan to flip her car.   2 Depression 07/25/16 A As evidenced by, pt rating as 10/10.   3 Anxiety 07/25/16 A As evidenced by, pt rating as 10/10.   4 Hypertension 07/25/16 A As evidenced by, pt history (BP 158/103).   5 Hypothyroidism 07/25/16 D As evidenced by, pt history.   6 IBS 07/25/16 D As evidenced by pt history.   7 Tobacco use 07/25/16 A As evidenced by pt reporting smoking 2 ppd.      Problem  Number Goals   Interventions/Modality Target Date/Progression           1 LTG: Pt will have no SI for 24 hours prior to discharge Target Date: 07/29/16  Progress:           STG: Pt will take medications as prescribed.   Nursing will provide at least 1 educational group per day for 50 minutes.    Recreation will provide at least 1 recreational group per day for 50 minutes.    Therapy will provide at least 1 therapy group session per day for 50 minutes    PCT will provide at least 1 group session per day for 50 minutes.    Nursing staff will offer a Daily wrap up/reflection group for at least  30 minutes a day.     Physician will prescribe medications as needed based on daily evaluations.    Nursing staff will complete 15 minute checks on pt.     Physician will order 1:1 staffing for pt for suicide precautions.    Physician will order within line of sight supervision.    Physician will put pt on fall precautions.    Therapy will assist pt in identifying appropriate follow up plan.    Therapy will complete family conference     Other:  Target Date:  07/30/16      Progression:      STG: Pt will identify at least 3 reasons to live.   Nursing will provide at least 1 educational group per day for 50 minutes.    Recreation will provide at least 1 recreational group per day for 50 minutes.    Therapy will provide at least 1 therapy group session per day for 50 minutes    PCT will provide at least 1 group session per day for 50 minutes.    Nursing staff will offer a Daily wrap up/reflection group for at least 30 minutes a day.     Physician will prescribe medications as needed based on daily evaluations.    Nursing staff will complete 15 minute checks on pt.    Physician will order 1:1 staffing for pt for suicide precautions.    Physician will order within line of sight supervision.    Physician will put pt on fall precautions.    Therapy will assist pt in identifying appropriate follow up plan.    Therapy will complete family conference     Other: Target Date: 07/30/16      Progression:     2 LTG: Patient will report depression at a  3/10 or lower prior to discharge.  Target Date: 07/30/16  Progress:          STG: Patient will identify 2 triggers to depression.    Nursing will provide at least 1 educational group per day for 50 minutes.    Recreation will provide at least 1 recreational group per day for 50 minutes.    Therapy will provide at least 1 therapy group session per day for 50 minutes    PCT will provide at least 1 group session per day for 50 minutes.     Nursing staff will offer a Daily wrap up/reflection group for at least 30 minutes a day.     Physician will prescribe medications as needed based on daily evaluations.    Nursing staff will complete 15 minute checks on pt.    Physician will order 1:1 staffing for pt for suicide precautions.    Physician will order within line of sight supervision.    Physician will put pt on fall precautions.    Therapy will assist pt in identifying appropriate follow up plan.    Therapy will complete family conference     Other: Target Date: 07/30/16      Progression:      STG: Patient will identify 2 coping/relaxation strategies.    Nursing will provide at least 1 educational group per day for 50 minutes.    Recreation will provide at least 1 recreational group per day for 50 minutes.    Therapy will provide at least 1 therapy group session per day for 50 minutes    PCT will provide at least 1 group session per day for 50 minutes.    Nursing staff will offer a Daily wrap up/reflection group for at least 30 minutes a day.     Physician will prescribe medications as needed based on daily evaluations.    Nursing staff will complete 15 minute checks on pt.    Physician will order 1:1 staffing for pt for suicide precautions.    Physician will order within line of sight supervision.    Physician will put pt on fall precautions.    Therapy will assist pt in identifying appropriate follow up plan.    Therapy will complete family conference     Other: Target Date: 07/30/16      Progression:       3 LTG: Patient will report anxiety at a 3/10 or lower prior to discharge.  Target Date: 07/30/16  Progress:          STG: Patient will identify 2 triggers to anxiety.    Nursing will provide at least 1 educational group per day for 50 minutes.    Recreation will provide at least 1 recreational group per day for 50 minutes.    Therapy will provide at least 1 therapy group session per day for 50 minutes     PCT will provide at least 1 group session per day for 50 minutes.    Nursing staff will offer a Daily wrap up/reflection group for at least 30 minutes a day.     Physician will prescribe medications as needed based on daily evaluations.    Nursing staff will complete 15 minute checks on pt.    Physician will order 1:1 staffing for pt for suicide precautions.    Physician will order within line of sight supervision.    Physician will put pt on fall precautions.    Therapy will assist pt in identifying appropriate follow up  plan.    Therapy will complete family conference     Other: Target Date: 07/30/16      Progression:      STG: Patient will identify 2 coping/relaxation strategies.    Nursing will provide at least 1 educational group per day for 50 minutes.    Recreation will provide at least 1 recreational group per day for 50 minutes.    Therapy will provide at least 1 therapy group session per day for 50 minutes    PCT will provide at least 1 group session per day for 50 minutes.    Nursing staff will offer a Daily wrap up/reflection group for at least 30 minutes a day.     Physician will prescribe medications as needed based on daily evaluations.    Nursing staff will complete 15 minute checks on pt.    Physician will order 1:1 staffing for pt for suicide precautions.    Physician will order within line of sight supervision.    Physician will put pt on fall precautions.    Therapy will assist pt in identifying appropriate follow up plan.    Therapy will complete family conference     Other: Target Date: 07/30/16      Progression:             4 LTG: Pt's HTN will be stabilized for 24 hours prior to discharge. Target Date: 07/29/16  Progress:      STG- Pt will be 100% compliant with blood pressure monitoring.    Nursing will provide at least 1 educational group per day for 50 minutes.    Recreation will provide at least 1 recreational group per day for 50 minutes.     Therapy will provide at least 1 therapy group session per day for 50 minutes    PCT will provide at least 1 group session per day for 50 minutes.    Nursing staff will offer a Daily wrap up/reflection group for at least 30 minutes a day.     Physician will prescribe medications as needed based on daily evaluations.    Nursing staff will complete 15 minute checks on pt.    Physician will order 1:1 staffing for pt for suicide precautions.    Physician will order within line of sight supervision.    Physician will put pt on fall precautions.    Therapy will assist pt in identifying appropriate follow up plan.    Therapy will complete family conference     Nursing staff will monitor symptoms and provide medical interventions when needed.    Other:  Target Date: 07/30/16    Progression:      STG- Pt will be 100% compliant with medication to control blood pressure.   Nursing will provide at least 1 educational group per day for 50 minutes.    Recreation will provide at least 1 recreational group per day for 50 minutes.    Therapy will provide at least 1 therapy group session per day for 50 minutes    PCT will provide at least 1 group session per day for 50 minutes.    Nursing staff will offer a Daily wrap up/reflection group for at least 30 minutes a day.     Physician will prescribe medications as needed based on daily evaluations.    Nursing staff will complete 15 minute checks on pt.    Physician will order 1:1 staffing for pt for suicide precautions.    Physician will order within line of  sight supervision.    Physician will put pt on fall precautions.    Therapy will assist pt in identifying appropriate follow up plan.    Therapy will complete family conference     Nursing staff will monitor symptoms and  provide medical interventions when needed.    Other:   Target Date: 07/30/16    Progression:     7 LTG: Pt will abstain from tobacco use 100% of the time during hospitalization.  Target Date: 07/30/16  Progression:         STG-Pt will utilize nicotine replacement options during hospitalization.    Nursing will provide at least 1 educational group per day for 50 minutes.    Recreation will provide at least 1 recreational group per day for 50 minutes.    Therapy will provide at least 1 therapy group session per day for 50 minutes    PCT will provide at least 1 group session per day for 50 minutes.    Nursing staff will offer a Daily wrap up/reflection group for at least 30 minutes a day.     Physician will prescribe medications as needed based on daily evaluations.    Nursing staff will complete 15 minute checks on pt.    Physician will order 1:1 staffing for pt for suicide precautions.    Physician will order within line of sight supervision.    Physician will put pt on fall precautions.    Therapy will assist pt in identifying appropriate follow up plan.    Therapy will complete family conference     Nursing staff will monitor symptoms and provide medical interventions when needed.    Other:  Target Date: 07/30/16     Progression:        STG- Pt will verbalize an understanding of the effects of tobacco use at least 1x prior to discharge.   Nursing will provide at least 1 educational group per day for 50 minutes.    Recreation will provide at least 1 recreational group per day for 50 minutes.    Therapy will provide at least 1 therapy group session per day for 50 minutes    PCT will provide at least 1 group session per day for 50 minutes.    Nursing staff will offer a Daily wrap up/reflection group for at least 30 minutes a day.     Physician will prescribe medications as needed based on daily evaluations.    Nursing staff will complete 15 minute checks on pt.    Physician will order 1:1 staffing for pt for suicide precautions.    Physician will order within line of sight supervision.    Physician will put pt on fall precautions.    Therapy will assist pt in identifying appropriate follow up plan.     Therapy will complete family conference     Nursing staff will monitor symptoms and provide medical interventions when needed.    Other: Target Date: 07/30/16     Progression:         PATIENT INVOLVEMENT IN TREATMENT PLAN      Treatment Plan was developed with input from and discussed with patient/guardian/conservator/designee?   - Yes, completed with staff during individual session   - Yes, completed during treatment planning team meet   - No, please explain:     Motivation for Treatment (in patient's words):   Patient's goal for treatment (in patient's own words):   How will we know when you are ready to go? (in  patient's own words):      FAMILY/SIGNIFICANT OTHER INVOLVEMENT IN TREATMENT PLAN      Was family involved in the development of treatment plan:     - Yes, family is involved   - No, Family not involved   - No known family   - Patient does not wish family involvement, explain     Family's goal for treatment (in family's own words):

## 2016-07-26 NOTE — Other (Signed)
[facility]  BEHAVIORAL HEALTH SERVICES  CHAPLAIN GROUP NOTE    Amber EisenmengerDebra Sue [name]    GROUP SESSION Date: 07/26/2016 Start Time: 1000  Stop Time: 1100 Type: Spiritual Group     Intervention/Techniques:    Validated/Supported, Listened/Empathized and Promoted Peer Support     Patient Behavior & Appearance: Attentive and Cooperative     Special Patient Treatment Accommodations Provided: n/a     Group Topic/Patient Response:       Today was a worship service. The patient participated in the worship activities, and gave appropriate spiritual feedback and encouragement.            Plan:Continue spiritual inquiry through conversation, reflection and prayer.            Scientist, forensicChaplain Signature Chaplain Printed Name & Credential Date: Time:

## 2016-07-26 NOTE — Other (Signed)
[facility]  Group Note  [address]  [place], [place] [id number]  ????  ????  ????  Date of service: 07/26/16  ????  Start time: 11am  Stop time: 12pm  ????  Type of session: Recreational Activity Group  ????  Problem number: Bi Polar/Depression  ????  Short term goal (STG): Pt will watch the movie "Myriam JacobsonHelen" and learn about living and dealing with depression/bi-polar.  ????  Intervention/techniques: Observed/Monitored  ????  Patient mental status/affect: Calm  ????  Patient behavior/appearance: Calm??Neatly Groomed  ????  Special patient treatment accommodations provided (describe): None needed  ????  Patient response and progress towards goals: Pt is responding towards goal by watching the movie "Myriam JacobsonHelen" and understand the daily struggle of living with depression/bi-polar and ways to overcome it.  ????  ????  Lance B. Sydnor

## 2016-07-26 NOTE — Behavioral Health Treatment Team (Signed)
Psychiatry Progress Note    Date: 07/26/2016  Account Number:  [id number]  Name: Amber [name]      Subjective:     Patient seen. She is agitated and said she had an argument over the phone with husband yesterday and she ended up cutting her self in the arm with her fingers. She is depressed and said she is getting a divorce and plans to see her lawyer after discharge. She said her medication is helping her. She denies psychosis.     Patient Active Problem List    Diagnosis Date Noted   ??? Suicidal behavior 07/25/2016   ??? Suicidal ideations 07/25/2016   ??? Bipolar disorder (HCC) 07/25/2016   ??? Bipolar 1 disorder, mixed (HCC) 12/30/2014   ??? Suicidal ideation 12/29/2014   ??? Seizures (HCC) 07/20/2014   ??? HTN (hypertension) 07/20/2014   ??? GERD (gastroesophageal reflux disease) 07/20/2014   ??? Tobacco abuse counseling 05/18/2014   ??? Hyperlipidemia 05/18/2014   ??? Lumbar pain with radiation down left leg 05/18/2014   ??? COPD (chronic obstructive pulmonary disease) (HCC) 03/24/2014   ??? Hypothyroidism 03/24/2014   ??? Tobacco abuse 03/24/2014   ??? Homicidal ideation 08/06/2013   ??? Hypokalemia 03/24/2013   ??? Fracture of ankle, bimalleolar, left, closed 03/23/2013   ??? Essential hypertension, benign 03/23/2013   ??? Anxiety state 03/23/2013     Class: Acute     Past Surgical History:   Procedure Laterality Date   ??? DELIVERY C-SECTION     ??? HX CHOLECYSTECTOMY      lap chole   ??? HX GI      colonoscopy, EGD   ??? HX ORTHOPAEDIC      trimalleolar fx repair 03/23/13   ??? HX OTHER SURGICAL      thyroidectomy april 2012   ??? HX OTHER SURGICAL      C/section with stillborn - under general anesthesia   ??? HX TUBAL LIGATION      lap hysterectomy      Allergies   Allergen Reactions   ??? Topamax [Topiramate] Anaphylaxis   ??? Ultram [Tramadol] Itching   ??? Nasal Spray [Sodium Chloride] Other (comments)     Migraine nasal spray makes her throat bleed   ??? Aspirin Other (comments)     Swelling "knot on side of neck"   ??? Ciprofloxacin Other (comments)      Causes facial redness     ??? Norvasc [Amlodipine] Hives   ??? Tamsulosin Swelling   ??? Levsin [Hyoscyamine Sulfate] Other (comments)     Blurred vision     ??? Pcn [Penicillins] Nausea and Vomiting      Social History   Substance Use Topics   ??? Smoking status: Current Every Day Smoker     Packs/day: 2.00     Years: 27.00   ??? Smokeless tobacco: Never Used   ??? Alcohol use 0.0 oz/week     0 Standard drinks or equivalent per week      Comment: rare      Family History   Problem Relation Age of Onset   ??? Arthritis-osteo Mother    ??? Cancer Mother    ??? Migraines Mother    ??? Headache Mother    ??? Heart Disease Mother    ??? Heart Disease Father    ??? Asthma Sister    ??? Lung Disease Brother    ??? Arthritis-osteo Maternal Grandmother    ??? Cancer Maternal Grandmother    ??? Diabetes Maternal Grandmother    ???  Hypertension Maternal Grandmother    ??? Stroke Maternal Grandmother    ??? Breast Cancer Maternal Grandmother    ??? Hypertension Maternal Grandfather    ??? Alcohol abuse Neg Hx    ??? Bleeding Prob Neg Hx    ??? Elevated Lipids Neg Hx    ??? Psychiatric Disorder Neg Hx    ??? Mental Retardation Neg Hx       Lori D McCoy, DO        Objective:         Patient Vitals for the past 8 hrs:   BP Temp Pulse Resp SpO2 Weight   07/26/16 0716 116/82 98.2 ??F (36.8 ??C) 62 20 99 % 51.9 kg (114 lb 6.4 oz)         Mental Status exam:     Pt appears stated age, kempt and casually dressed. Fair eye contact.  Psychomotor agitared. AAO X 3. Fair memory, concentration & attention span  Speech is articulated, normal rate, volume and quantity  Mood is irritable and anxious and affect is appropriate  TP is goal directed. TC No delusion, No suicidal/homicidal ideation, intent or plan  Perception, no AVH. Average intelligence and fund of knowledge  Impulse control is poor, Insight and judgement is limited        Therapy notes nursing notes and labs in the past 24 hours reviewed and appreciated    Assessment/Plan:   Principal Problem:    Bipolar disorder (HCC) (07/25/2016)     Active Problems:    COPD (chronic obstructive pulmonary disease) (HCC) (03/24/2014)      Hypothyroidism (03/24/2014)      Tobacco abuse (03/24/2014)      Suicidal behavior (07/25/2016)      Suicidal ideations (07/25/2016)          Assessment    Patient is irritable and displaying distress intolerance. She harmed her self yesterday on the unit but she is committing to safety today.     Changes In Treatment Plan, Potential Side Effect and Benefit and alternatives discussed.    The following information was reviewed and discussed:     The risks and benefits of the proposed medication   patient given opportunity to ask questions   off label use of an approved drug/prescription discussed with patient     Treatment Plan    Ativan 1 mg now  Therapeutic Milieu  DVT Prophylaxis Ambulation  Within Line of sight    Medications:    Current Facility-Administered Medications   Medication Dose Route Frequency   ??? aluminum & magnesium hydroxide-simethicone (MYLANTA II) oral suspension 30 mL  30 mL Oral Q4H PRN   ??? hydrOXYzine pamoate (VISTARIL) capsule 50 mg  50 mg Oral QID PRN   ??? magnesium hydroxide (MILK OF MAGNESIA) concentrated oral suspension 5 mL  5 mL Oral Q6H PRN   ??? nicotine (NICODERM CQ) 21 mg/24 hr patch 1 Patch  1 Patch TransDERmal DAILY PRN   ??? traZODone (DESYREL) tablet 100 mg  100 mg Oral QHS PRN   ??? estradiol (CLIMARA) 0.05 mg/24 hr patch 1 Patch  1 Patch TransDERmal Q7D   ??? DULoxetine (CYMBALTA) capsule 30 mg  30 mg Oral DAILY   ??? divalproex DR (DEPAKOTE) tablet 125 mg  125 mg Oral TID   ??? risperiDONE (RisperDAL) tablet 1 mg  1 mg Oral BID   ??? levothyroxine (SYNTHROID) tablet 112 mcg  112 mcg Oral ACB           Signed By: Wilburt Finlay  Theora MasterA Colen Eltzroth, MD

## 2016-07-26 NOTE — Behavioral Health Treatment Team (Signed)
[facility]  INPATIENT BEHAVIORAL HEALTH  NURSING PROGRESS NOTE  Amber EisenmengerDebra Sue [name]  07/25/2016;      BEHAVIORAL ASSESSMENT  Pt aox4, dull affect, anxious mood pt denies SI/HI/AVH, reports depression and anxiety 10/10, pt takes meds cooperatively     Sleep Assessment  Hours nightly slept: pt appeared asleep by 0130 and still appears asleep  Difficulty Falling Asleep: no  Difficulty Staying Asleep: no  Broken Sleep: no      NURSING GROUP:    TOPIC OF GROUP - spiritual group   Leader of Group - pastor phil   START TIME - 1800  STOP TIME - 1900  Patient Participation - refused   Describe Patient's involvement in group - refused         INTERVENTIONS      Treatment Plan Problem List & Correlating Goals/Interventions    Problem # Problem   1 depression   2 anxiety   3 HTN   4 Hypothyroidism    5 IBS       CLIENT RESPONSE TO TREATMENT    Describe Patient Response to Master Treatment Plan's Goals/Interventions:     Depression - pt takes meds cooperatively rates 10/10  Anxiety - rates 10/10 takes meds cooperatively   HTN - pt takes meds cooperatively   Hypothyroidism - pt takes meds cooperatively   IBS - pt takes meds cooperatively
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
## 2016-07-26 NOTE — Other (Signed)
[facility]  Group Note  [address]  [place], [place]?? [address]  ????  ????  ????  Date of service: 07/26/16  ????  Start time: 3pm  Stop time: 4pm  ????  Type of session: Recreational Activity Group/Combined Group  ????  Problem number: Socialization  ????  Short term goal (STG): Pt will attend recreation/activity group and participate in artwork, video games, air hockey, and cornhole socialize appropriately with AT and other pt's  ????  Intervention/techniques: Observed/Monitored, Discussed, Listened  ????  Patient mental status/affect: Calm  ????  Patient behavior/appearance: Neatly Groomed  ????  Special patient treatment accommodations provided (describe): None needed  ????  Patient response and progress towards goals: Pt is responding towards goal by attending recreation/activity group and participating in activitiesl and socializing appropriately with AT and other pt's.  ????  ????  Lance B. Sydnor

## 2016-07-27 MED FILL — RISPERIDONE 1 MG TAB: 1 mg | ORAL | Qty: 1

## 2016-07-27 MED FILL — DIVALPROEX 125 MG TAB, DELAYED RELEASE: 125 mg | ORAL | Qty: 1

## 2016-07-27 MED FILL — DULOXETINE 30 MG CAP, DELAYED RELEASE: 30 mg | ORAL | Qty: 1

## 2016-07-27 MED FILL — HYDROXYZINE PAMOATE 50 MG CAP: 50 mg | ORAL | Qty: 1

## 2016-07-27 MED FILL — LEVOTHYROXINE 112 MCG TAB: 112 mcg | ORAL | Qty: 1

## 2016-07-27 NOTE — Behavioral Health Treatment Team (Signed)
[facility]  INPATIENT BEHAVIORAL HEALTH  NURSING PROGESS NOTE    Amber EisenmengerDebra Sue [name]  07/26/2016; 1650    BEHAVIORAL ASSESSMENT    Depression rated 5  Anxiety rated 5  Alert and orientedx4.   Denies si  Denies hi  Denies hallucinations  Eating well with a good appetite  well dressed  well kempt    Appetite Assessment  Percentage of Patient's Food Consumption: 100%     SUICIDE RE-ASSESSMENT  In the past 12 hours have you had thoughts of hurting yourself:  No  (If no when was the last time you have had thoughts of suicide):  Admission  Explain the thoughts (descriptive information):  Argument with husband  What plan does the patient have:  None currently  On scale of 1-10 how does patient rate suicidal thoughts:  1     Nurses objective of patients reported suicidal thoughts:  1     On the nurses scale of 1-5 where does your patient rate 1= no suicidal ideations, 2= vague thoughts, 3= strong thoughts with no plan; 4= thoughts with plan low lethality high chance of rescue; 5= strong thoughts with plan and means to carry out plan high lethality low chance of rescue)      NURSING GROUP  refused        INTERVENTIONS  Medication Orders/Changes by Physician this Shift: None    Treatment Plan Problem List & Correlating Goals/Interventions    Problem # Problem   1 depression   2 anxiety   3 HTN   4 hypothyroidism   5 IIBS       CLIENT RESPONSE TO TREATMENT    Describe Patient Response to Master Treatment Plan's Goals/Interventions:Accepting/Compliant    PLAN  1:1 provided therapeutic listening, therapeutic communication with patient, and educated patient on coping skills to deal with harmful thoughts and impulses. Current coping skills assessed. Educated and encouraged patient to comply with treatment plan. Administered medications as prescribed. Teaching done on all medications administered. Maintained patient safety this shift.

## 2016-07-27 NOTE — Behavioral Health Treatment Team (Addendum)
Psychiatry Progress Note    Date: 07/27/2016  Account Number:  [id number]  Name: Amber EisenmengerDebra Sue [name]      Subjective:     Patient seen. She said she is feeling better since being back on her medication.  She said she hs made up with ehr husband as he is being affectionate again but said she has decided she wants to get divorced from him. She said her depression is less. depressed and she said her medication is helping her. She denies psychosis.     Patient Active Problem List    Diagnosis Date Noted   ??? Suicidal behavior 07/25/2016   ??? Suicidal ideations 07/25/2016   ??? Bipolar disorder (HCC) 07/25/2016   ??? Bipolar 1 disorder, mixed (HCC) 12/30/2014   ??? Suicidal ideation 12/29/2014   ??? Seizures (HCC) 07/20/2014   ??? HTN (hypertension) 07/20/2014   ??? GERD (gastroesophageal reflux disease) 07/20/2014   ??? Tobacco abuse counseling 05/18/2014   ??? Hyperlipidemia 05/18/2014   ??? Lumbar pain with radiation down left leg 05/18/2014   ??? COPD (chronic obstructive pulmonary disease) (HCC) 03/24/2014   ??? Hypothyroidism 03/24/2014   ??? Tobacco abuse 03/24/2014   ??? Homicidal ideation 08/06/2013   ??? Hypokalemia 03/24/2013   ??? Fracture of ankle, bimalleolar, left, closed 03/23/2013   ??? Essential hypertension, benign 03/23/2013   ??? Anxiety state 03/23/2013     Class: Acute     Past Surgical History:   Procedure Laterality Date   ??? DELIVERY C-SECTION     ??? HX CHOLECYSTECTOMY      lap chole   ??? HX GI      colonoscopy, EGD   ??? HX ORTHOPAEDIC      trimalleolar fx repair 03/23/13   ??? HX OTHER SURGICAL      thyroidectomy april 2012   ??? HX OTHER SURGICAL      C/section with stillborn - under general anesthesia   ??? HX TUBAL LIGATION      lap hysterectomy      Allergies   Allergen Reactions   ??? Topamax [Topiramate] Anaphylaxis   ??? Ultram [Tramadol] Itching   ??? Nasal Spray [Sodium Chloride] Other (comments)     Migraine nasal spray makes her throat bleed   ??? Aspirin Other (comments)     Swelling "knot on side of neck"   ??? Ciprofloxacin Other (comments)      Causes facial redness     ??? Norvasc [Amlodipine] Hives   ??? Tamsulosin Swelling   ??? Levsin [Hyoscyamine Sulfate] Other (comments)     Blurred vision     ??? Pcn [Penicillins] Nausea and Vomiting      Social History   Substance Use Topics   ??? Smoking status: Current Every Day Smoker     Packs/day: 2.00     Years: 27.00   ??? Smokeless tobacco: Never Used   ??? Alcohol use 0.0 oz/week     0 Standard drinks or equivalent per week      Comment: rare      Family History   Problem Relation Age of Onset   ??? Arthritis-osteo Mother    ??? Cancer Mother    ??? Migraines Mother    ??? Headache Mother    ??? Heart Disease Mother    ??? Heart Disease Father    ??? Asthma Sister    ??? Lung Disease Brother    ??? Arthritis-osteo Maternal Grandmother    ??? Cancer Maternal Grandmother    ??? Diabetes Maternal  Grandmother    ??? Hypertension Maternal Grandmother    ??? Stroke Maternal Grandmother    ??? Breast Cancer Maternal Grandmother    ??? Hypertension Maternal Grandfather    ??? Alcohol abuse Neg Hx    ??? Bleeding Prob Neg Hx    ??? Elevated Lipids Neg Hx    ??? Psychiatric Disorder Neg Hx    ??? Mental Retardation Neg Hx       Lori D McCoy, DO        Objective:         No data found.        Mental Status exam:     Pt appears stated age, kempt and casually dressed. Fair eye contact.  Psychomotor neutral. AAO X 3. Fair memory, concentration & attention span  Speech is articulated, normal rate, volume and quantity  Mood is depressed and anxious and affect is appropriate  TP is goal directed. TC No delusion, No suicidal/homicidal ideation, intent or plan  Perception, no AVH. Average intelligence and fund of knowledge  Impulse control is poor, Insight and judgement is limited        Therapy notes nursing notes and labs in the past 24 hours reviewed and appreciated    Assessment/Plan:   Principal Problem:    Bipolar disorder (HCC) (07/25/2016)    Active Problems:    COPD (chronic obstructive pulmonary disease) (HCC) (03/24/2014)      Hypothyroidism (03/24/2014)       Tobacco abuse (03/24/2014)      Suicidal behavior (07/25/2016)      Suicidal ideations (07/25/2016)          Assessment    Patient is feeling better on the unit but she is committing to safety today.     Changes In Treatment Plan, Potential Side Effect and Benefit and alternatives discussed.    The following information was reviewed and discussed:     The risks and benefits of the proposed medication   patient given opportunity to ask questions   off label use of an approved drug/prescription discussed with patient     Treatment Plan    May Benefit couple therapy after discharge   Continue current medication.   Therapeutic Milieu  DVT Prophylaxis Ambulation  Within Line of sight    Medications:    Current Facility-Administered Medications   Medication Dose Route Frequency   ??? aluminum & magnesium hydroxide-simethicone (MYLANTA II) oral suspension 30 mL  30 mL Oral Q4H PRN   ??? hydrOXYzine pamoate (VISTARIL) capsule 50 mg  50 mg Oral QID PRN   ??? magnesium hydroxide (MILK OF MAGNESIA) concentrated oral suspension 5 mL  5 mL Oral Q6H PRN   ??? nicotine (NICODERM CQ) 21 mg/24 hr patch 1 Patch  1 Patch TransDERmal DAILY PRN   ??? traZODone (DESYREL) tablet 100 mg  100 mg Oral QHS PRN   ??? estradiol (CLIMARA) 0.05 mg/24 hr patch 1 Patch  1 Patch TransDERmal Q7D   ??? DULoxetine (CYMBALTA) capsule 30 mg  30 mg Oral DAILY   ??? divalproex DR (DEPAKOTE) tablet 125 mg  125 mg Oral TID   ??? risperiDONE (RisperDAL) tablet 1 mg  1 mg Oral BID   ??? levothyroxine (SYNTHROID) tablet 112 mcg  112 mcg Oral ACB           Signed By: Conard Novak, MD

## 2016-07-28 MED FILL — DIVALPROEX 125 MG TAB, DELAYED RELEASE: 125 mg | ORAL | Qty: 1

## 2016-07-28 MED FILL — LEVOTHYROXINE 112 MCG TAB: 112 mcg | ORAL | Qty: 1

## 2016-07-28 MED FILL — HYDROXYZINE PAMOATE 50 MG CAP: 50 mg | ORAL | Qty: 1

## 2016-07-28 MED FILL — RISPERIDONE 1 MG TAB: 1 mg | ORAL | Qty: 1

## 2016-07-28 MED FILL — DULOXETINE 30 MG CAP, DELAYED RELEASE: 30 mg | ORAL | Qty: 1

## 2016-07-28 NOTE — Behavioral Health Treatment Team (Signed)
[facility]  [facility]PATIENT [facility] HEALTH  NURSING PROGESS NOTE    Amber EisenmengerDebra Sue [name]  07/26/2016; 1650    BEHAVIORAL ASSESSMENT    Depression rated 0  Anxiety rated 0  Alert and orientedx4.   Denies si  Denies hi  Denies hallucinations  Eating well with a good appetite  well dressed  well kempt    Appetite Assessment  Percentage of Patient's Food Consumption: 100%     SUICIDE RE-ASSESSMENT  In the past 12 hours have you had thoughts of hurting yourself:  No  (If no when was the last time you have had thoughts of suicide):  Admission  Explain the thoughts (descriptive information):  Argument with husband  What plan does the patient have:  None currently  On scale of 1-10 how does patient rate suicidal thoughts:  1     Nurses objective of patients reported suicidal thoughts:  1     On the nurses scale of 1-5 where does your patient rate 1= no suicidal ideations, 2= vague thoughts, 3= strong thoughts with no plan; 4= thoughts with plan low lethality high chance of rescue; 5= strong thoughts with plan and means to carry out plan high lethality low chance of rescue)      NURSING GROUP  refused        INTERVENTIONS  Medication Orders/Changes by Physician this Shift: None    Treatment Plan Problem List & Correlating Goals/Interventions    Problem # Problem   1 depression   2 anxiety   3 HTN   4 hypothyroidism   5 IIBS       CLIENT RESPONSE TO TREATMENT    Describe Patient Response to Master Treatment Plan's Goals/Interventions:Accepting/Compliant    PLAN  1:1 provided therapeutic listening, therapeutic communication with patient, and educated patient on coping skills to deal with harmful thoughts and impulses. Current coping skills assessed. Educated and encouraged patient to comply with treatment plan. Administered medications as prescribed. Teaching done on all medications administered. Maintained patient safety this shift.

## 2016-07-28 NOTE — Behavioral Health Treatment Team (Signed)
[facility]  NURSING PROGESS NOTE    Amber EisenmengerDebra Sue [name]  07/28/2016;  [phone]    BEHAVIORAL ASSESSMENT  Patient within line of sight this shift.  Patient in common area this shift watching tv and socializing with other patients.  Alert and oriented times four.  Good eye contact.  Good hygiene.  Speech is clear and coherent.  General attitude cooperataive.  Affect constricted. Mood anxious. Rates depression 2 out of 10.  Rates anxiety 6 out of 10.  Reports VH of shadows at home but not here. Denies SI, HI, AH.  Patient commits to safety and states that she has no intention of harming herself or others.  Compliant with medications.  Patient reports that she is feeling better and that she is also sleeping better.    Sleep Assessment  Difficulty Falling Asleep: Yes  Difficulty Staying Asleep: No  Broken Sleep: No    Appetite Assessment  Percentage of Patient's Food Consumption: 100% of HS snack  Clinician's Observations: Tolerated Well    SUICIDE RE-ASSESSMENT  In the past 12 hours have you had thoughts of hurting yourself:  No  (If no when was the last time you have had thoughts of suicide):  Admission  Explain the thoughts (descriptive information):  Argument with husband before admission  What plan does the patient have:  None currently  On scale of 1-10 how does patient rate suicidal thoughts:  1     Nurses objective of patients reported suicidal thoughts:  1     On the nurses scale of 1-5 where does your patient rate 1= no suicidal ideations, 2= vague thoughts, 3= strong thoughts with no plan; 4= thoughts with plan low lethality high chance of rescue; 5= strong thoughts with plan and means to carry out plan high lethality low chance of rescue)      NURSING GROUP    TOPIC OF GROUP:  Daily Wrapup  Leader of Group:  Pam Rn  START TIME:  1815  STOP TIME:  1900  Patient Participation:  attended   Describe Patient's involvement in group:  Participated        INTERVENTIONS   Medication Orders/Changes by Physician this Shift: None    Treatment Plan Problem List & Correlating Goals/Interventions    Problem # Problem   1 depression   2 anxiety   3 HTN   4 hypothyroidism   5 IIBS       CLIENT RESPONSE TO TREATMENT    Describe Patient Response to Master Treatment Plan Goals/Interventions:Accepting/Compliant    PLAN  1:1 provided therapeutic listening, interactive/commmunication with patient, and educated patient on coping skills to deal with harmful thoughts and impulses. Current coping skills assessed. Educated and encouraged patient to comply with treatment plan. Administered medications as prescribed. Teaching done on all medications administered. Maintained patient safety this shift

## 2016-07-28 NOTE — Behavioral Health Treatment Team (Signed)
Psychiatry Progress Note    Date: 07/28/2016  Account Number:  [id number]  Name: Amber [name]      Subjective:     Patient seen. She reports anxiety and feeling depressed. She still wants to get divorced from him. She said her medication is helping her. She denies psychosis.     Patient Active Problem List    Diagnosis Date Noted   ??? Suicidal behavior 07/25/2016   ??? Suicidal ideations 07/25/2016   ??? Bipolar disorder (HCC) 07/25/2016   ??? Bipolar 1 disorder, mixed (HCC) 12/30/2014   ??? Suicidal ideation 12/29/2014   ??? Seizures (HCC) 07/20/2014   ??? HTN (hypertension) 07/20/2014   ??? GERD (gastroesophageal reflux disease) 07/20/2014   ??? Tobacco abuse counseling 05/18/2014   ??? Hyperlipidemia 05/18/2014   ??? Lumbar pain with radiation down left leg 05/18/2014   ??? COPD (chronic obstructive pulmonary disease) (HCC) 03/24/2014   ??? Hypothyroidism 03/24/2014   ??? Tobacco abuse 03/24/2014   ??? Homicidal ideation 08/06/2013   ??? Hypokalemia 03/24/2013   ??? Fracture of ankle, bimalleolar, left, closed 03/23/2013   ??? Essential hypertension, benign 03/23/2013   ??? Anxiety state 03/23/2013     Class: Acute     Past Surgical History:   Procedure Laterality Date   ??? DELIVERY C-SECTION     ??? HX CHOLECYSTECTOMY      lap chole   ??? HX GI      colonoscopy, EGD   ??? HX ORTHOPAEDIC      trimalleolar fx repair 03/23/13   ??? HX OTHER SURGICAL      thyroidectomy april 2012   ??? HX OTHER SURGICAL      C/section with stillborn - under general anesthesia   ??? HX TUBAL LIGATION      lap hysterectomy      Allergies   Allergen Reactions   ??? Topamax [Topiramate] Anaphylaxis   ??? Ultram [Tramadol] Itching   ??? Nasal Spray [Sodium Chloride] Other (comments)     Migraine nasal spray makes her throat bleed   ??? Aspirin Other (comments)     Swelling "knot on side of neck"   ??? Ciprofloxacin Other (comments)     Causes facial redness     ??? Norvasc [Amlodipine] Hives   ??? Tamsulosin Swelling   ??? Levsin [Hyoscyamine Sulfate] Other (comments)     Blurred vision      ??? Pcn [Penicillins] Nausea and Vomiting      Social History   Substance Use Topics   ??? Smoking status: Current Every Day Smoker     Packs/day: 2.00     Years: 27.00   ??? Smokeless tobacco: Never Used   ??? Alcohol use 0.0 oz/week     0 Standard drinks or equivalent per week      Comment: rare      Family History   Problem Relation Age of Onset   ??? Arthritis-osteo Mother    ??? Cancer Mother    ??? Migraines Mother    ??? Headache Mother    ??? Heart Disease Mother    ??? Heart Disease Father    ??? Asthma Sister    ??? Lung Disease Brother    ??? Arthritis-osteo Maternal Grandmother    ??? Cancer Maternal Grandmother    ??? Diabetes Maternal Grandmother    ??? Hypertension Maternal Grandmother    ??? Stroke Maternal Grandmother    ??? Breast Cancer Maternal Grandmother    ??? Hypertension Maternal Grandfather    ???  Alcohol abuse Neg Hx    ??? Bleeding Prob Neg Hx    ??? Elevated Lipids Neg Hx    ??? Psychiatric Disorder Neg Hx    ??? Mental Retardation Neg Hx       Lori D McCoy, DO        Objective:         Patient Vitals for the past 8 hrs:   BP Temp Pulse Resp SpO2 Weight   07/28/16 1501 105/84 98.5 ??F (36.9 ??C) 80 20 100 % -   07/28/16 0740 - - - - - 52.4 kg (115 lb 9 oz)         Mental Status exam:     Pt appears stated age, kempt and casually dressed. Fair eye contact.  Psychomotor neutral. AAO X 3. Fair memory, concentration & attention span  Speech is articulated, normal rate, volume and quantity  Mood is depressed and anxious and affect is appropriate  TP is goal directed. TC No delusion, No suicidal/homicidal ideation, intent or plan  Perception, no AVH. Average intelligence and fund of knowledge  Impulse control is poor, Insight and judgement is limited        Therapy notes nursing notes and labs in the past 24 hours reviewed and appreciated    Assessment/Plan:   Principal Problem:    Bipolar disorder (HCC) (07/25/2016)    Active Problems:    COPD (chronic obstructive pulmonary disease) (HCC) (03/24/2014)      Hypothyroidism (03/24/2014)       Tobacco abuse (03/24/2014)      Suicidal behavior (07/25/2016)      Suicidal ideations (07/25/2016)          Assessment    Patient is feeling better on the unit but she is committing to safety today.     Changes In Treatment Plan, Potential Side Effect and Benefit and alternatives discussed.    The following information was reviewed and discussed:     The risks and benefits of the proposed medication   patient given opportunity to ask questions   off label use of an approved drug/prescription discussed with patient     Treatment Plan    May Benefit couple therapy after discharge   Continue current medication.   Therapeutic Milieu  DVT Prophylaxis Ambulation  DC within Line of sight  Anticipate DC in 24 hours.     Medications:    Current Facility-Administered Medications   Medication Dose Route Frequency   ??? aluminum & magnesium hydroxide-simethicone (MYLANTA II) oral suspension 30 mL  30 mL Oral Q4H PRN   ??? hydrOXYzine pamoate (VISTARIL) capsule 50 mg  50 mg Oral QID PRN   ??? magnesium hydroxide (MILK OF MAGNESIA) concentrated oral suspension 5 mL  5 mL Oral Q6H PRN   ??? nicotine (NICODERM CQ) 21 mg/24 hr patch 1 Patch  1 Patch TransDERmal DAILY PRN   ??? traZODone (DESYREL) tablet 100 mg  100 mg Oral QHS PRN   ??? estradiol (CLIMARA) 0.05 mg/24 hr patch 1 Patch  1 Patch TransDERmal Q7D   ??? DULoxetine (CYMBALTA) capsule 30 mg  30 mg Oral DAILY   ??? divalproex DR (DEPAKOTE) tablet 125 mg  125 mg Oral TID   ??? risperiDONE (RisperDAL) tablet 1 mg  1 mg Oral BID   ??? levothyroxine (SYNTHROID) tablet 112 mcg  112 mcg Oral ACB           Signed By: Conard NovakAyobola A Kohlton Gilpatrick, MD

## 2016-07-29 MED ORDER — DULOXETINE 30 MG CAP, DELAYED RELEASE
30 mg | ORAL_CAPSULE | Freq: Every day | ORAL | 0 refills | Status: AC
Start: 2016-07-29 — End: 2016-08-12

## 2016-07-29 MED ORDER — DIVALPROEX 125 MG TAB, DELAYED RELEASE
125 mg | ORAL_TABLET | Freq: Three times a day (TID) | ORAL | 0 refills | Status: AC
Start: 2016-07-29 — End: 2016-08-12

## 2016-07-29 MED FILL — DIVALPROEX 125 MG TAB, DELAYED RELEASE: 125 mg | ORAL | Qty: 1

## 2016-07-29 MED FILL — DULOXETINE 30 MG CAP, DELAYED RELEASE: 30 mg | ORAL | Qty: 1

## 2016-07-29 MED FILL — RISPERIDONE 1 MG TAB: 1 mg | ORAL | Qty: 1

## 2016-07-29 MED FILL — HYDROXYZINE PAMOATE 50 MG CAP: 50 mg | ORAL | Qty: 1

## 2016-07-29 MED FILL — LEVOTHYROXINE 112 MCG TAB: 112 mcg | ORAL | Qty: 1

## 2016-07-29 MED FILL — MILK OF MAGNESIA CONCENTRATED 2,400 MG/10 ML ORAL SUSPENSION: 2400 mg/10 mL | ORAL | Qty: 10

## 2016-07-29 NOTE — Discharge Summary (Signed)
I spent over 45 minutes in coordination of care for the discharge of this patient including preparation of this DC summary dictated [id number]

## 2016-07-29 NOTE — Other (Signed)
Pt has the following intake appt scheduled at [facility] [address] [place], [place] [id number] Demetrius Charity(P. [phone]; F. [phone]) on 08/05/16 @ 12:30 pm. She needs to bring photo ID & proof of income for sliding scale.

## 2016-07-29 NOTE — Other (Signed)
[facility] of seeing a therapist & psychiatrist. They said that it's $80 per 15 minutes & has to be paid up front. Therapist then called [facility] & left a voicemail. Therapist then spoke with pt & she said that her income was $20 over Medicaid eligibility due to getting rental income. She said that she didn't want to go to either place & just wouldn't take her meds. She said that she wanted to sign out AMA.

## 2016-07-29 NOTE — Discharge Summary (Signed)
[place]    Name:  Amber [name], PERHAM    MR #:  [id number]    Account #:  [id number]    DOB:  Jun 28, 1966    Age:  50Y    Admitted:  07/25/2016    Discharged:  07/29/2016    DISCHARGE SUMMARY    REASON FOR PSYCHIATRIC HOSPITALIZATION:  A 50 year old Caucasian female, married, employed part-time as a  Retail buyer in a bar in [place], who was admitted because she stated  that she wanted to kill herself by flipping her car.      For history and physical please refer to dictated History & Physical  on electronic medical record for details of chief complaint, history  of present illness, past psychiatric history, past medical history,  personal / social history and mental status examination on  admission.     For DSM Diagnosis on admission please refer to electronic medical  record for details of this.     HOSPITAL COURSE:   Patient was admitted to inpatient [facility], managed as  a case of unspecified bipolar disorder and other related disorder,  rule out major depressive disorder with psychotic features.  Patient  was placed on standard monitoring and standard precautions, placed  on standard prn orders.  Patient received group therapy,  participated in group and unit activities.  Patient received  multidisciplinary team care including therapy in the milieu.  Patient was discussed regularly in staffing during the week.  Patient was noted initially when she came in to be agitated and  argumentative to her husband over the phone and also noted to be  having self-injurious behavior on the unit, cutting herself on hands  and fingers.  She has been depressed and also insisted that she is  going to call her lawyer to discuss divorcing her husband when she  gets discharged from the hospital.  She had been non-compliant with  her medication prior to hospitalization.  Medication was restarted  and patient started responding well to medication.  She was   restarted back on Duloxetine 30 mg po daily and Risperdal 1 mg po  b.i.d. and Depakote 125 mg po three times daily.  She showed steady  improvement over hospitalization.  Mood became stable.  She was  sleeping through the night.  Appetite is good.  Concentration is  intact.  She reported that she was feeling better.  Also she  reported that husband has been more affectionate to her and that it  is making her feel better.  Patient denies psychosis.  She adamantly  denies suicidal thoughts.  She denies homicidal thoughts.  She  continued to commit to safety on the unit.  She was offered  step-down to crisis unit, she refused.  She was looking forward to  being discharged.  Patient benefited from therapy in the milieu and  multidisciplinary team care.  Patient reported energy is fine, mood  is fine, sleepy is better.  She was attending group and  participating in group and unit activities.  She was subsequently  discharged in good condition.  At the time of discharge patient was  not psychotic.  She was capable of making her own financial and  medical decisions.  She verbalized adequate understanding of  treatment team recommendations, medication and aftercare plan.  Patient was subsequently discharged in good condition as she has met  the maximum benefit of acute inpatient behavioral care.  She was  educated on importance of complying with medication and dangers of  medication non-compliance were clearly discussed with patient.  Patient verbalized adequate understanding.  She was subsequently  informed that non-compliant with medication could spike her risk of  suicide acutely.  Patient verbalized adequate understanding.     MENTAL STATUS EVALUATION ON DISCHARGE:  A young woman looking stated age, dressed appropriate to weather  with fair grooming and hygiene.  She is calm.  She is cooperative,  appropriate in behavior.  Speech is spontaneous, calm, clear and   coherent.  Her mood is stable.  Her thoughts are logical mostly and  goal directed.  She adamantly denies suicidal thoughts.  She denies  homicidal thoughts.  She denies hallucinations.  She denied  delusions.  She denies any paranoid or persecutory ideation.  She is  alert, awake, oriented to person, place and person.  She has good  attention span.  She has fairly good insight and judgment.      DSM DIAGNOSIS ON DISCHARGE:    AXIS I: Unspecified Bipolar Disorder and other related disorders            Rule out Major Depressive Disorder with Psychotic Features            History of Benzodiazepine Use Disorder   AXIS II: Cluster B Trait            Possibly Borderline Personality Disorder  AXIS III: History of hypertension and angina  AXIS IV:  Psychosocial Stressors:  Mental problems, history of  non-compliance with medication, poor coping skills, poor problem  solving skills, chemical dependency   AXIS V:  Current GAF:  55 to 60    DISCHARGE DIET:  Cardiac    DISCHARGE ACTIVITY:  As tolerated    DISCHARGE FOLLOW-UP:   For Psychiatry patient will follow-up with [facility].  For Medical  patient will follow-up with primary care physician.     Patient is to call 911 or go to the Emergency Department for any  recurrence of symptoms, feeling suicidal or homicidal.  Patient  verbalized adequate understanding.      All precautions were discontinued on discharge.     DISCHARGE MEDICATIONS:   1. Depakote 125 mg three times daily   2. Duloxetine 30 mg po daily   3. Risperdal 1 mg po three times daily   4.       AO:bsb  DD: 08/08/2016 14:55:25  DT: 08/12/2016 09:14:24  Job ID:  [id number]  CC:

## 2016-07-29 NOTE — Discharge Summary (Signed)
I spent over 45 minutes in coordination of care for the discharge of this patient including preparation of this DC summary dictated [id number]

## 2016-07-29 NOTE — Other (Signed)
[facility]  Group Note  [address]  [place], [place]?? [address]  ????  ????  ????  Date of service: 07/29/16  ????  Start time: 10am  Stop time: 11am  ????  Type of session: Recreational Activity Group  ????  Problem number: Socialization  ????  Short term goal (STG): Pt will attend recreation/activity group and participate in artwork, video games, air hockey, and cornhole and??socialize appropriately with AT and other pt's  ????  Intervention/techniques: Observed/Monitored, Discussed, Listened  ????  Patient mental status/affect: Calm  ????  Patient behavior/appearance: Neatly Groomed  ????  Special patient treatment accommodations provided (describe): None needed  ????  Patient response and progress towards goals: Pt is responding towards goal by attending recreation/activity group and participating in activities and socializing appropriately with AT and other pt's.  ????  ????  Lance B. Sydnor

## 2016-07-29 NOTE — Behavioral Health Treatment Team (Signed)
[facility]  INPATIENT [facility]ALTH  NURSING PROGESS NOTE    Amber EisenmengerDebra Sue [name]  07/29/2016;  [phone]    BEHAVIORAL ASSESSMENT  Patient in common area this shift watching tv and socializing with other patients.  Alert and oriented times four.  Good eye contact.  Good hygiene.  Speech is clear and coherent.  General attitude cooperataive.  Affect constricted. Mood anxious. Rates depression 0 out of 10.  Rates anxiety 1 out of 10.  Reports VH of shadows at home but not here. Denies SI, HI, AH.  Patient commits to safety and states that she has no intention of harming herself or others.  Compliant with medications.  Patient reports that she is feeling better.    Sleep Assessment  Difficulty Falling Asleep: No  Difficulty Staying Asleep:  No  Broken Sleep: No    Appetite Assessment  Percentage of Patient's Food Consumption: 100% of HS snack  Clinician's Observations: Tolerated Well    SUICIDE RE-ASSESSMENT  In the past 12 hours have you had thoughts of hurting yourself:  No  (If no when was the last time you have had thoughts of suicide):  Admission  Explain the thoughts (descriptive information):  Argument with husband before admission  What plan does the patient have:  None currently  On scale of 1-10 how does patient rate suicidal thoughts:  1     Nurses objective of patients reported suicidal thoughts:  1     On the nurses scale of 1-5 where does your patient rate 1= no suicidal ideations, 2= vague thoughts, 3= strong thoughts with no plan; 4= thoughts with plan low lethality high chance of rescue; 5= strong thoughts with plan and means to carry out plan high lethality low chance of rescue)      NURSING GROUP    TOPIC OF GROUP:  AA Group  Leader of Group:  Jim of AA  START TIME:  2000  STOP TIME:  2045  Patient Participation:  attended   Describe Patient's involvement in group:  Participated        INTERVENTIONS  Medication Orders/Changes by Physician this Shift: None     Treatment Plan Problem List & Correlating Goals/Interventions    Problem # Problem   1 depression   2 anxiety   3 HTN   4 hypothyroidism   5 IIBS       CLIENT RESPONSE TO TREATMENT    Describe Patient Response to Master Treatment Plan Goals/Interventions:Accepting/Compliant    PLAN  1:1 provided therapeutic listening, interactive/commmunication with patient, and educated patient on coping skills to deal with harmful thoughts and impulses. Current coping skills assessed. Educated and encouraged patient to comply with treatment plan. Administered medications as prescribed. Teaching done on all medications administered. Maintained patient safety this shift

## 2016-07-29 NOTE — Behavioral Health Treatment Team (Signed)
[facility]  [facility]PAT[facility]  NURSING PROGRESS NOTE  Remigio EisenmengerDebra Sue Tallerico  07/29/2016; 0720      BEHAVIORAL ASSESSMENT  Patient in lounge talking to other patients.  A/O x3.  Cooperative.  Appearance is well-groomed.  Maintains eye contact and answers questions appropriately.  Pt states she is having anxiety regarding possible discharge today.  Rates anxiety 10/10.  Denies depression, SI, HI, AVH.  No s/s of disress.    Sleep Assessment  Hours nightly slept: 4  Difficulty Falling Asleep: Yes  Difficulty Staying Asleep: No  Broken Sleep: No      Appetite Assessment  Percentage of Patient's Food Consumption: 95%  Clinician's Observations:  No difficulty eating or sleeping    SUICIDE RE-ASSESSMENT  In the past 12 hours have you had thoughts of hurting yourself: No  On scale of 1-10 how does patient rate suicidal thoughts: 1     On the nurses scale of 1-5 where does your patient rate 1= no suicidal ideations, 2= vague thoughts, 3= strong thoughts with no plan; 4= thoughts with plan low lethality high chance of rescue; 5= strong thoughts with plan and means to carry out plan high lethality low chance of rescue)              INTERVENTIONS      Treatment Plan Problem List & Correlating Goals/Interventions    Problem # Problem    SI    depression    anxiety    HTN    hypothyroidism       CLIENT RESPONSE TO TREATMENT    Describe Patient Response to Master Treatment Plan's Goals/Interventions: Denies SI, depression.  Reports anxiety d/t possible discharge today.  HTN and hypothryoidism treated per physicians order.
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
## 2016-07-29 NOTE — Treatment Summary (Signed)
[facility]  Treatment Plan Review/Discharge Note      Date of Admit:  07/25/2016   Signature/Credential of Staff Initiating:    Craig Staggers, MSW, CSW Review Date:  07/29/16 Anticipated Date of Discharge:  08/01/16 Discharge Plan:  Home, [facility] Type:   - Review   - Discharge     PROBLEM LIST    Problem  Number PROBLEM  Include Significant Medical Issues Date Established Status As Evidenced by:    ??   A = Active  D = Deferred  I = Inactive  R = Resolved  M = Monitoring    1 Suicidal Ideation 07/25/16 A As evidenced by, pt having a plan to flip her car.   2 Depression 07/25/16 A As evidenced by, pt rating as 10/10.   3 Anxiety 07/25/16 A As evidenced by, pt rating as 10/10.   4 Hypertension 07/25/16 A As evidenced by, pt history (BP 158/103).   5 Hypothyroidism 07/25/16 D As evidenced by, pt history.   6 IBS 07/25/16 D As evidenced by pt history.   7 Tobacco use 07/25/16 A As evidenced by pt reporting smoking 2 ppd.      Problem  Number Goals Interventions/Modality Target Date/Progression   ??      ??  1 LTG: Pt will have no SI for 24 hours prior to discharge Target Date: 07/31/16  Progress: continue   ??  ?? STG: Pt will take medications as prescribed.   Nursing will provide at least 1 educational group per day for 50 minutes.    Recreation will provide at least 1 recreational group per day for 50 minutes.    Therapy will provide at least 1 therapy group session per day for 50 minutes    PCT will provide at least 1 group session per day for 50 minutes.    Nursing staff will offer a Daily wrap up/reflection group for at least 30 minutes a day.     Physician will prescribe medications as needed based on daily evaluations.    Nursing staff will complete 15 minute checks on pt.     Physician will order 1:1 staffing for pt for suicide precautions.    Physician will order within line of sight supervision.    Physician will put pt on fall precautions.     Therapy will assist pt in identifying appropriate follow up plan.    Therapy will complete family conference     Other:  Target Date:  08/01/16  ??  Progression: met, continue   ?? STG: Pt will identify at least 3 reasons to live.   Nursing will provide at least 1 educational group per day for 50 minutes.    Recreation will provide at least 1 recreational group per day for 50 minutes.    Therapy will provide at least 1 therapy group session per day for 50 minutes    PCT will provide at least 1 group session per day for 50 minutes.    Nursing staff will offer a Daily wrap up/reflection group for at least 30 minutes a day.     Physician will prescribe medications as needed based on daily evaluations.    Nursing staff will complete 15 minute checks on pt.    Physician will order 1:1 staffing for pt for suicide precautions.    Physician will order within line of sight supervision.    Physician will put pt on  fall precautions.    Therapy will assist pt in identifying appropriate follow up plan.    Therapy will complete family conference     Other: Target Date: 08/01/16  ??  Progression: progressing, continue   ??  2 LTG: Patient will report depression at a 3/10 or lower prior to discharge.  Target Date: 08/01/16  Progress: met, continue   ??  ?? STG: Patient will identify 2 triggers to depression.    Nursing will provide at least 1 educational group per day for 50 minutes.    Recreation will provide at least 1 recreational group per day for 50 minutes.    Therapy will provide at least 1 therapy group session per day for 50 minutes    PCT will provide at least 1 group session per day for 50 minutes.    Nursing staff will offer a Daily wrap up/reflection group for at least 30 minutes a day.     Physician will prescribe medications as needed based on daily evaluations.    Nursing staff will complete 15 minute checks on pt.    Physician will order 1:1 staffing for pt for suicide precautions.     Physician will order within line of sight supervision.    Physician will put pt on fall precautions.    Therapy will assist pt in identifying appropriate follow up plan.    Therapy will complete family conference     Other: Target Date: 08/01/16  ??  Progression: progressing, continue   ?? STG: Patient will identify 2 coping/relaxation strategies.    Nursing will provide at least 1 educational group per day for 50 minutes.    Recreation will provide at least 1 recreational group per day for 50 minutes.    Therapy will provide at least 1 therapy group session per day for 50 minutes    PCT will provide at least 1 group session per day for 50 minutes.    Nursing staff will offer a Daily wrap up/reflection group for at least 30 minutes a day.     Physician will prescribe medications as needed based on daily evaluations.    Nursing staff will complete 15 minute checks on pt.    Physician will order 1:1 staffing for pt for suicide precautions.    Physician will order within line of sight supervision.    Physician will put pt on fall precautions.    Therapy will assist pt in identifying appropriate follow up plan.    Therapy will complete family conference     Other: Target Date: 08/01/16  ??  Progression: progressing, continue   ??  3 LTG: Patient will report anxiety at a 3/10 or lower prior to discharge.  Target Date: 08/01/16  Progress: met, continue   ??  ?? STG: Patient will identify 2 triggers to anxiety.    Nursing will provide at least 1 educational group per day for 50 minutes.    Recreation will provide at least 1 recreational group per day for 50 minutes.    Therapy will provide at least 1 therapy group session per day for 50 minutes    PCT will provide at least 1 group session per day for 50 minutes.    Nursing staff will offer a Daily wrap up/reflection group for at least 30 minutes a day.     Physician will prescribe medications as needed based on daily evaluations.     Nursing staff will complete 15 minute checks on pt.    Physician will  order 1:1 staffing for pt for suicide precautions.    Physician will order within line of sight supervision.    Physician will put pt on fall precautions.    Therapy will assist pt in identifying appropriate follow up plan.    Therapy will complete family conference     Other: Target Date: 08/01/16  ??  Progression: progressing, continue   ?? STG: Patient will identify 2 coping/relaxation strategies.    Nursing will provide at least 1 educational group per day for 50 minutes.    Recreation will provide at least 1 recreational group per day for 50 minutes.    Therapy will provide at least 1 therapy group session per day for 50 minutes    PCT will provide at least 1 group session per day for 50 minutes.    Nursing staff will offer a Daily wrap up/reflection group for at least 30 minutes a day.     Physician will prescribe medications as needed based on daily evaluations.    Nursing staff will complete 15 minute checks on pt.    Physician will order 1:1 staffing for pt for suicide precautions.    Physician will order within line of sight supervision.    Physician will put pt on fall precautions.    Therapy will assist pt in identifying appropriate follow up plan.    Therapy will complete family conference     Other: Target Date: 08/01/16  ??  Progression: progressing, continue   ??          4 LTG: Pt's HTN will be stabilized for 24 hours prior to discharge. Target Date: 07/31/16  Progress: continue   ??  ?? STG- Pt will be 100% compliant with blood pressure monitoring.    Nursing will provide at least 1 educational group per day for 50 minutes.    Recreation will provide at least 1 recreational group per day for 50 minutes.    Therapy will provide at least 1 therapy group session per day for 50 minutes    PCT will provide at least 1 group session per day for 50 minutes.    Nursing staff will offer a Daily wrap up/reflection group for at least  30 minutes a day.     Physician will prescribe medications as needed based on daily evaluations.    Nursing staff will complete 15 minute checks on pt.    Physician will order 1:1 staffing for pt for suicide precautions.    Physician will order within line of sight supervision.    Physician will put pt on fall precautions.    Therapy will assist pt in identifying appropriate follow up plan.    Therapy will complete family conference     Nursing staff will monitor symptoms and provide medical interventions when needed.    Other:  Target Date: 08/01/16  ??  Progression: met, continue   ??  ?? STG- Pt will be 100% compliant with medication to control blood pressure.   Nursing will provide at least 1 educational group per day for 50 minutes.    Recreation will provide at least 1 recreational group per day for 50 minutes.    Therapy will provide at least 1 therapy group session per day for 50 minutes    PCT will provide at least 1 group session per day for 50 minutes.    Nursing staff will offer a Daily wrap up/reflection group for at least 30 minutes a day.     Physician will  prescribe medications as needed based on daily evaluations.    Nursing staff will complete 15 minute checks on pt.    Physician will order 1:1 staffing for pt for suicide precautions.    Physician will order within line of sight supervision.    Physician will put pt on fall precautions.    Therapy will assist pt in identifying appropriate follow up plan.    Therapy will complete family conference     Nursing staff will monitor symptoms and  provide medical interventions when needed.    Other: Target Date: 08/01/16  ??  Progression: met, continue   ??  7 LTG: Pt will abstain from tobacco use 100% of the time during hospitalization.  Target Date: 08/01/16  Progression: met, continue   ??  ?? STG-Pt will utilize nicotine replacement options during hospitalization.    Nursing will provide at least 1 educational group per day for 50 minutes.     Recreation will provide at least 1 recreational group per day for 50 minutes.    Therapy will provide at least 1 therapy group session per day for 50 minutes    PCT will provide at least 1 group session per day for 50 minutes.    Nursing staff will offer a Daily wrap up/reflection group for at least 30 minutes a day.     Physician will prescribe medications as needed based on daily evaluations.    Nursing staff will complete 15 minute checks on pt.    Physician will order 1:1 staffing for pt for suicide precautions.    Physician will order within line of sight supervision.    Physician will put pt on fall precautions.    Therapy will assist pt in identifying appropriate follow up plan.    Therapy will complete family conference     Nursing staff will monitor symptoms and provide medical interventions when needed.    Other:  Target Date: 08/01/16  ??  Progression: met, continue   ??  ?? STG- Pt will verbalize an understanding of the effects of tobacco use at least 1x prior to discharge.   Nursing will provide at least 1 educational group per day for 50 minutes.    Recreation will provide at least 1 recreational group per day for 50 minutes.    Therapy will provide at least 1 therapy group session per day for 50 minutes    PCT will provide at least 1 group session per day for 50 minutes.    Nursing staff will offer a Daily wrap up/reflection group for at least 30 minutes a day.     Physician will prescribe medications as needed based on daily evaluations.    Nursing staff will complete 15 minute checks on pt.    Physician will order 1:1 staffing for pt for suicide precautions.    Physician will order within line of sight supervision.    Physician will put pt on fall precautions.    Therapy will assist pt in identifying appropriate follow up plan.    Therapy will complete family conference     Nursing staff will monitor symptoms and provide medical interventions when needed.    Other: Target Date: 08/01/16  ??   Progression: met   ??  Pt is A&Ox4. She has been socializing with other patients. She last rated depression as 0/10 & anxiety as 1/10. She denies SI, HI, AVH.  ??

## 2016-07-29 NOTE — Behavioral Health Treatment Team (Signed)
Pt states she will make follow-up appointment with PCP

## 2016-07-29 NOTE — Other (Signed)
[facility]  BEHAVIORAL HEALTH SERVICES  CHAPLAIN GROUP NOTE    Remigio EisenmengerDebra Sue Abila    GROUP SESSION Date: 07/29/2016 Start Time: 1100  Stop Time: 1200 Type: Spiritual Group     Intervention/Techniques:    Validated/Supported, Listened/Empathized and Promoted Peer Support     Patient Behavior & Appearance: Attentive and Cooperative     Special Patient Treatment Accommodations Provided: n/a     Group Topic/Patient Response:     The topic of the day was religious objects/reflection. The patient made several remarks, regarding her childhood faith. The patient supported her peers with some appropriate spiritual feedback and encouragement.                Plan:Continue spiritual inquiry through conversation, reflection and prayer.            Scientist, forensicChaplain Signature Chaplain Printed Name & Credential Date: Time:

## 2016-07-29 NOTE — Behavioral Health Treatment Team (Signed)
SUICIDE RE-ASSESSMENT  In the past 12 hours have you had thoughts of hurting yourself: No  On scale of 1-10 how does patient rate suicidal thoughts: 1    On the nurses scale of 1-5 where does your patient rate 1= no suicidal ideations, 2= vague thoughts, 3= strong thoughts with no plan; 4= thoughts with plan low lethality high chance of rescue; 5= strong thoughts with plan and means to carry out plan high lethality low chance of rescue)           Orders received to discharge patient to home per Dr. Daria Pasturesloworaran.  Discharge instructions, healthy lifestyle, medications and follow up appointments reviewed with patient.  Patient verbalized understanding.  Pt will discharge to home with follow up at [facility],  appointment dates and times reviewed, patient verbalized understanding. AVS and BH Transition Record  Was completed, hard copy given to patient. Prescriptions for cymbalta, depakote Given to patient. All personal belongings obtained and verified with patient, discharge instructions and AVS signed.  No items in safe. AVS and BH Transition Record information reviewed with patient.  Patient provided with Behavioral Health phone number [phone] and instructed to talk  with Charge Nurse for any needs 24 hours a day /7days a week.Patient discharged via front hallway in stable condition with all belongings. Patient left building with husband.

## 2016-07-29 NOTE — Other (Signed)
Therapist spoke with [facility] & pt would be eligible for sliding scale based on the county she lives in. The charge ranges from $5.00 to $75.00.

## 2016-08-06 ENCOUNTER — Encounter: Attending: Neurology | Primary: Family Medicine

## 2016-08-08 NOTE — Discharge Summary (Signed)
[facility]    Name:  Amber [name], Amber [name]    MR #:  [id number]    Account #:  [id number]    DOB:  Sep 19, 1966    Age:  49Y    Admitted:  07/25/2016    Discharged:  07/29/2016    DISCHARGE SUMMARY    REASON FOR PSYCHIATRIC HOSPITALIZATION:  A 50 year old Caucasian female, married, employed part-time as a  Retail buyer in a bar in [place], who was admitted because she stated  that she wanted to kill herself by flipping her car.      For history and physical please refer to dictated History & Physical  on electronic medical record for details of chief complaint, history  of present illness, past psychiatric history, past medical history,  personal / social history and mental status examination on  admission.     For DSM Diagnosis on admission please refer to electronic medical  record for details of this.     HOSPITAL COURSE:   Patient was admitted to inpatient [facility], managed as  a case of unspecified bipolar disorder and other related disorder,  rule out major depressive disorder with psychotic features.  Patient  was placed on standard monitoring and standard precautions, placed  on standard prn orders.  Patient received group therapy,  participated in group and unit activities.  Patient received  multidisciplinary team care including therapy in the milieu.  Patient was discussed regularly in staffing during the week.  Patient was noted initially when she came in to be agitated and  argumentative to her husband over the phone and also noted to be  having self-injurious behavior on the unit, cutting herself on hands  and fingers.  She has been depressed and also insisted that she is  going to call her lawyer to discuss divorcing her husband when she  gets discharged from the hospital.  She had been non-compliant with  her medication prior to hospitalization.  Medication was restarted  and patient started responding well to medication.  She was  restarted back  on Duloxetine 30 mg po daily and Risperdal 1 mg po  b.i.d. and Depakote 125 mg po three times daily.  She showed steady  improvement over hospitalization.  Mood became stable.  She was  sleeping through the night.  Appetite is good.  Concentration is  intact.  She reported that she was feeling better.  Also she  reported that husband has been more affectionate to her and that it  is making her feel better.  Patient denies psychosis.  She adamantly  denies suicidal thoughts.  She denies homicidal thoughts.  She  continued to commit to safety on the unit.  She was offered  step-down to crisis unit, she refused.  She was looking forward to  being discharged.  Patient benefited from therapy in the milieu and  multidisciplinary team care.  Patient reported energy is fine, mood  is fine, sleepy is better.  She was attending group and  participating in group and unit activities.  She was subsequently  discharged in good condition.  At the time of discharge patient was  not psychotic.  She was capable of making her own financial and  medical decisions.  She verbalized adequate understanding of  treatment team recommendations, medication and aftercare plan.  Patient was subsequently discharged in good condition as she has met  the maximum benefit of acute inpatient behavioral care.  She was  educated on importance of complying with medication and dangers of  medication non-compliance were clearly discussed with patient.  Patient verbalized adequate understanding.  She was subsequently  informed that non-compliant with medication could spike her risk of  suicide acutely.  Patient verbalized adequate understanding.     MENTAL STATUS EVALUATION ON DISCHARGE:  A young woman looking stated age, dressed appropriate to weather  with fair grooming and hygiene.  She is calm.  She is cooperative,  appropriate in behavior.  Speech is spontaneous, calm, clear and  coherent.  Her mood is stable.  Her thoughts are logical mostly and  goal  directed.  She adamantly denies suicidal thoughts.  She denies  homicidal thoughts.  She denies hallucinations.  She denied  delusions.  She denies any paranoid or persecutory ideation.  She is  alert, awake, oriented to person, place and person.  She has good  attention span.  She has fairly good insight and judgment.      DSM DIAGNOSIS ON DISCHARGE:    AXIS I: Unspecified Bipolar Disorder and other related disorders            Rule out Major Depressive Disorder with Psychotic Features            History of Benzodiazepine Use Disorder   AXIS II: Cluster B Trait            Possibly Borderline Personality Disorder  AXIS III: History of hypertension and angina  AXIS IV:  Psychosocial Stressors:  Mental problems, history of  non-compliance with medication, poor coping skills, poor problem  solving skills, chemical dependency   AXIS V:  Current GAF:  55 to 60    DISCHARGE DIET:  Cardiac    DISCHARGE ACTIVITY:  As tolerated    DISCHARGE FOLLOW-UP:   For Psychiatry patient will follow-up with [facility].  For Medical  patient will follow-up with primary care physician.     Patient is to call 911 or go to the Emergency Department for any  recurrence of symptoms, feeling suicidal or homicidal.  Patient  verbalized adequate understanding.      All precautions were discontinued on discharge.     DISCHARGE MEDICATIONS:   1. Depakote 125 mg three times daily   2. Duloxetine 30 mg po daily   3. Risperdal 1 mg po three times daily   4.       AO:bsb  DD: 08/08/2016 14:55:25  DT: 08/12/2016 09:14:24  Job ID:  [id number]  CC:

## 2016-09-03 ENCOUNTER — Encounter: Attending: Neurology | Primary: Family Medicine

## 2016-09-03 MED ORDER — CLONAZEPAM 1 MG TAB
1 mg | ORAL_TABLET | ORAL | 3 refills | Status: DC | PRN
Start: 2016-09-03 — End: 2016-12-10

## 2016-09-21 ENCOUNTER — Emergency Department: Payer: PRIVATE HEALTH INSURANCE | Primary: Family Medicine

## 2016-09-21 DIAGNOSIS — F3160 Bipolar disorder, current episode mixed, unspecified: Principal | ICD-10-CM

## 2016-09-21 NOTE — ED Notes (Cosign Needed)
Patient refuses pelvic xr.
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
## 2016-09-21 NOTE — ED Notes (Signed)
PA at bedside.

## 2016-09-21 NOTE — ED Notes (Addendum)
Pt refused xray. States "That's one more bill that I can't afford and I already know nothing is broken." [place], [place], notified.

## 2016-09-21 NOTE — ED Triage Notes (Addendum)
Patient states "I've had enough".  I inquired if that meant she wanted to harm herself, patient responded "Yes".  States trigger is lack of job and financial problems.  Denies having a plan. Admits to drinking 4 beers this evening.

## 2016-09-21 NOTE — ED Notes (Signed)
Pt states "I don't want this IV, I don't want the fluids, I don't care. I don't want any of it." Explained to pt the benefits of the fluids, the need for the IV, pt persists. Pt pulled out IV after continually educating pt on the need for the IV. [place], [place] notified.

## 2016-09-21 NOTE — Other (Signed)
INTAKE SCREENING TOOL  [facility]    Amber [name]  09/21/2016, 2400   Involuntary:   yes          If Yes; date/time hold began:2300    Patient Information   Phone: [phone]   Address: [address] [place], [place]   DOB: 2066/04/26   Social Security #: [id number]   Age: 50   Sex: Female   Living Arrangements: With spouse   Date of Last Inpatient Admission: 07-2016   Legal Guardian/POA: SELF   Phone:    Communicates Verbally: yes   Were you referred here by anyone:     If so, name and phone number:         Chief Complaint/Symptoms (Describe reason for seeking help and describe symptoms as per DSM V.   PT IN ED WITH THOUGHTS OF SUICIDE WITH NO PLAN. STATES SHE'S BEEN FEELING THIS WAY "FOR YEARS." SAYS SHE IS "TIRED, TIRED OF EVERYTHING-I DON'T KNOW WHERE TO BEGIN."  MADE A SUICIDE ATTEMPT IN 2001.  WAS LAST IP AT [facility] IN AUG THIS YEAR FOR SI.  PT STATES TODAY SHE HAS NO PLAN. SAYS SHE WALKED ABOUT A MILE TO GET HERE BEFORE BEING PICKED UP BY A FRIEND.  STATES SHE IS GOING THRU A DIVORCE.  ADDITIONAL STRESSOR IS THAT HER HUSBAND LOST HIS JOB AT [employer] 2 YEARS AGO. PT ALSO STATES SHE SEES "SHADOW PEOPLE" AND HAS "FOR YEARS."  SAYS SHE WAS PUT ON LITHIUM RECENTLY ONLY SHE HAS NO PCP TO FOLLOW THE BLOOD WORK NECESSARY WHEN TAKING LITHIUM SO SHE STOPPED TAKING IT          Current Stressors Due to Mental Illness and/or Substance Use   SPOUSAL ISSUES    Explain all that is marked:               Previous Mental Health Treatment     Last inpatient admission:2017/08   Type: IP  Where: [facility]  When:   Reason:SI   LOS:  Outcome:   Type:   Where:   When:   Reason:   LOS:  Outcome:   Type:   Where:   When:   Reason:   LOS:  Outcome:   *Key: IP- Inpatient, OP- Outpatient, Res-Residential, NH- Nursing Home, AL- Assisted Living, Other   Current Psychiatrist: [facility]  Phone:   Current Therapist: NONE  Phone:      Trauma History   Abuse DENIES   If yes to any of the above describe:     Other Traumatic Experience: NONE      Counter-indications to Restraints: 0       Substance Use History   History of Substance Abuse: No:   Are you currently craving: No:     Key: Blackouts, Tremors, Sweats, Delirium, Seizures, Nausea, Vomiting, Diarrhea, Abdominal Cramps, Hallucinations, Loss of Job, Loss of Spouse, Loss of Memory, Mood Swings, DUI's    **Use within the last 12 months**    Substance Use Age of Onset Date of Last Use Length of Sobriety Amount Used Route of  Use Pattern of Use BioMed. Cons Psych. Cons Legal Cons   Alcohol DENIES           Amphet.            Barbit.            Benzo.            Coc./Crk.  Halluc.            Heroin            Inhalant            Marijuana            Nicotine            Opiates            PCP            Prescript.            Synthetic            Tranq.            Other              Substance Abuse Previous Treatment  Support Groups- AA: no, NA: no, Other: no   Sponsor:      LOS: Where: When: Reason: Type: Response:   LOS: Where:  When: Reason: Type: Response:   LOS: Where:  When: Reason: Type: Response:    *Key: IP-Inpatient, OP-Outpatient, Res- Residential, Other: Explain:     Family History of Mental Health and/or Substance Abuse   (Include Family History of Suicide Attempts)      DENIES                 Support system/Recovery Environment:   Does individual live with other people who drink or use drugs? yes  Explain:   Does individual have good supports in place (family, friends, coworkers, church, Catering manageretc)?   yes  Explain:   Does individual understand and accept his/her illness? yes   Patient's stage of change: action   Internal Motivation:       External Motivation:         Medical Information   Medical History: THYROID ISSUES   Surgical History: NONE   Diet:   Current UJW:JXBJPCP:NONE  Address:  Phone:  Date of last visit:     Medical Conditions  Allergies: yes COPD: yes HIV/AIDS: no MRSA: no   Asthma: no CVA:no Hepatitis A,B or C: no Pregnant: no    Bulimia/Anorexia: no Cardiac: no HTN: yes Prosthesis: no   Uses C-PAP: no Cirrhosis: no IDM/IIDDM: no Seizure Disorder: yes   Other: no Fractures: no Infectious Disease Type:         Vitals     BP: Temp: Pulse: Resp: O2: Ht: Wt:       Medications: Current Medications as Per Patient  Medication Dose/Last Dose Frequency Compliance Referring Physician   SEE PTA                                   Current Medication List Attached: no  Pharmacy:   Phone:    Do you have a history of a positive TB skin test: no  Are you experiencing any of these symptoms:   Cough > 3 weeks: no  Bloody Sputum: no  Unwanted weight loss: no  Night Sweats: no  ADLS: self-feeding, grooming, bathing and upper body dressing  Ambulatory: yes  With Assistance: 0    Current Review of Symptoms  Constitutional: WDL Genitourinary: WDL   Skin: none Musculoskeletal: WDL   Eyes: WDL Allergy/Heatologic/Endo: WDL   Cardiovascular: WDL Neurological: WDL   Respiratory: negative Psychiatric: WDL     Gastrointestinal: none Appetite changes:    Weight changes:    Sleep pattern:  Mental Status Exam   Suicidal Ideation: yes  Means:  Degree of SI/HI, behavior and/or intentions:  Suicide Attempt: no  Explain:  Previous Attempts: yes  Explain:    Protective Factors (list):     Risk Factors (list):     Homicidal Ideation: no  Plans & Means:   Towards:  Depression:yes  Symptoms: depressed mood  Mania: no  Symptoms: racing thoughts  Anxiety: yes  Symptoms: feelings of apprehension/worry  Hallucinations: yes  Symptoms: visual  Delusions: no  Symptoms: DENIES  Explain:  Are hallucinations/delusions different form their norm: no  Explain:  Degree to which individual's impairment creates danger for themselves or others:   Aggression: Yes  Explain:      [facility]  INTAKE SUICIDE SCREENING TOOL    Part 1 of 2  Adapted from the [place] Suicide Severity Rating Scale Since Last Visit      Ask questions that are bold and underlined. Place X in Box. YES NO   Ask Questions 1 and 2  1) Wish to be Dead:   Person endorses thoughts about a wish to be dead or not alive anymore, or wish to fall asleep and not wake up.    Have you wished you were dead or wished you could go to sleep and not wake up? X      2) Suicidal Thoughts:  General non specific thoughts of wanting to end one's life/die by suicide, "I've thought about killing myself" without general thoughts of ways to kill oneself/associated methods, intent, or plan.    Have you actually had any thoughts of killing yourself? X    If YES to 2, ask questions 3,4,5 and 6. If NO to 2, go directly to question 6  3) Suicidal Thoughts with Method (without Specific Plan or Intent to Act):   Person endorses thoughts of suicide and has thought of at least one method during the assessment period. This is different than a specific plan with time, place or method details worked out. "I thought about taking an overdose but I never made a specific plan as to when where or how I would actually do it... And I would never go through with it."    Have you been thinking about how you might kill yourself?  X     4) Suicidal Intent (without Specific Plan):  Active suicidal thoughts of killing oneself and patient reports having some intent to act on such thoughts, as opposed to "I have the thoughts but I definitely will not do anything about them."    Have you had these thoughts and had some intention of acting on them?       5) Suicide Intent with Specific Plan:  Thoughts of killing oneself with details of plan fully or partially worked out and person has some intent to carry it out.    Have you started to work out or worked out the details of how to kill yourself and do you intend to carry out this plan?       6) Suicide Behavior:  Have you done anything, started to do anything, or prepared to do anything to end your life?     Examples: Collected pills, obtained a gun, gave away valuables, wrote a will or suicide note, took out pills but didn't swallow any, held a gun but changed your mind or it was grabbed from your hand, went to the roof but didn't jump; or actually took pills, tried to shoot yourself,  cut yourself, tried to hang yourself, etc.       Part 2 of 2  SUICIDE RISK SCREEN   Place "X" by each necessary risk factor in "Risk" Box   RISK FACTOR LOWER RISK MILD RISK MODERATE RISK HIGH RISK   1. Intent/Ambience No intent to die Minimal Intent Moderate Intent Clear Intent   2.Lethality of attempt (or Plan) None/Ideation Only Arly.Keller ] Gesture [ ]  Non-lethal [ ]  Potentially lethal (esp., firearm, hanging, OD) [ ]    3. Prior Attempts 2-10 years ago Arly.Keller ]  6-12 mos ago [ ]  1wk-6 mos ago  [ ]    4. Hopelessness Hopeful [ ]   Ambivalent Arly.Keller ] Hopeless [ ]    5. Substance Abuse None [ X]  Abuse [ ]  Dependence [ ]    6. Support System Good Support [ ]   Conflicted Arly.Keller ] None [ ]    7. Current Stressor Severity None [ ]   Moderate Arly.Keller ] Severe [ ]    8. Loss & Trauma (Past 6 Mos) None Arly.Keller ]  Serious [ ]  Multiple [ ]    9. Gender Female [ X]  Female [ ]     10. Age 24-15 [ ]  15-24 [ ]  24-69 Arly.Keller ] 70+ [ ]    64. Marital Status Married/Partner   Arly.Keller ] Single [ ]  Divorced [ ]  Widowed [ ]    12. Sexual Orientation Heterosexual Arly.Keller ]  LBGQT [ ]     13. Ethnicity Non-white/Black [ ]   White [ X]    14. Chronic/Severe/Illness and/or Functional Impairment None Arly.Keller ] Acute Illness and/or mild functional impairment [ ]  Chronic Illness and/or mild functional impairment [ ]  Chronic Illness and/or moderate-to-severe functional impairment [ ]    15. Level of Insomnia None [ X] 4-5 Hours of Sleep [ ]  1-3 Hours of Sleep  [ ]  No Sleep [ ]      Note:  Any 2 or more in any category triggers the higher level of risk.  Please summarize your findings and indicate what factors contributed to the patients level of risk:        MODERATE        Alcohol Use Disorders Identification Test (Audit) Interview Version*   *Instructions:  Read the questions as written. Record the answers carefully. Begin the AUDIT by saying "Now I am going to ask you some questions about your use of alcoholic beverages during the past year." Explain what is meant by "alcoholic beverages" by using local examples of beer, wine, vodka and so on. Record answers in terms of "standard drinks." Place the correct answer number at the bottom under "box score".     1. How often do you have a drink containing alcohol?  (0) Never [Skip to Qs 9-10]  (1) Monthly or less  (2) 2 to 4 times a month  (3) 2 to 3 times a week  (4) 4 or more times a week      Box Score: 0 6. How often during the last year have you needed a first drink in the morning to get yourself going after a heavy drinking session?  (0) Never  (1) Less than monthly  (2) Monthly  (3) Weekly  (4) Daily or almost daily    Box Score: 0   2. How many drinks containing alcohol do you have on a typical day when you are drinking?  (0) 1 or 2  (1) 3 or 4  (2) 5 or 6  (3) 7,8 or 9  (4) 10  or more    Box Score: 0 7.How often during the last year have you had a feeling of guilt or remorse after drinking?  (0) Never  (1) Less than monthly  (2) Monthly  (3) Weekly  (4) Daily or almost daily      Box Score: 0   3. How often do you have six or more drinks on one occasion?  (0) Never  (1) Less than monthly  (2) Monthly  (3) Weekly  (4) Daily or almost daily  Skip to Questions 9 and 10 if the total score for Questions 2 and 3= 0    Box Score: 0 8. How often during the last year have you been unable to remember what happened the night before because you had been drinking?  (0) Never  (1) Less than monthly  (2) Monthly  (3) Weekly  (4) Daily or almost daily    Box Score: 0   4. How often during the last year have you found that you were not able to stop drinking once you had started?  (0) Never  (1) Less than monthly  (2) Monthly  (3) Weekly   (4) Daily or almost daily    Box Score: 0 9. Have you or someone else been injured as a result of your drinking?  (0) No  (2) Yes, but not in the last year  (4) Yes, during the last year          Box Score: 0   5. How often during the last year have you failed to do what was normally expected from you because of drinking?  (0) Never  (1) Less than monthly  (2) Monthly  (3) Weekly  (4) Daily or almost daily    Box Score: 0 10. Has a relative, friend, doctor, or another health worker been concerned about your drinking or suggested you cut down?  (0) No  (2) Yes, but not in the last year  (4) Yes, during the last year        Box Score: 0   Scoring Key  8 to 15- simple advice focused on the reduction of hazardous drinking  16 to 19- brief counseling and continued monitoring  20 or above- further diagnostic evaluation for alcohol dependence     Record total of specific items here:      *This form is adapted from the World Health Organization's [facility]) Alcohol Use Disorders Identification Test (AUDIT) form, For more information, please see "AUDIT- The Alcohol Use Disorders Identification Test: Guidelines for Use in Primary Care at https://www.hamilton-[employer]res.com/      Checklist for Aggression Risk     Present Status Yes/No Wt. Score   1. Admission symptoms related to violence (actual assault, specific threat, attempted assault) no (3)    2. Specific identified victim * no (3)    3. Specific identified plan: no (3)    4. Access to or possession of means to carry out plan (available weapons) * no (3)    5. Intoxicated (alcohol, cocaine, crack or hallucinogens) no (3)    6. Acts upon paranoid ideation no (2)    7. Exhibits command auditory hallucinations no (2)    8. Currently making threats* no (2)    9. Current physical agitation* no (2)    10. Non-communicative no (2)    11. Hallucinating no (1)    12. Paranoid Thoughts no (1)    13. Medication Non-compliance no (1)  Environmental Factors Yes/No Wt. Score   1. Aggressive behavior at time of admission* no (3)    2. Aggressive behavior within one week of admission no (2)    3. Aggression provoking factors wtihtin environment no (2)    4. Recent non-violent psychosocial stressor within environment no (1)    5. Recent victim of aggression within environment no (1)    6. Female no (1)      Previous Clinical History Yes/No Wt. Score   1. Diagnosis of neurological impairment* no (3)    2. History of arrest/conviction for violent act (including arson) no (3)    3. History of harming other clients or staff * no (3)    4. History of non-compliance no (2)    5. History of arrest/conviction for violent crime within past year no (2)    6. History of paranoid diagnosis no (1)    7. History of antisocial, borderline, or paranoid personality diagnosis no (1)      TOTAL SCORE OF ASSESSMENT: 0     (Score= [(1) Yes or (0) No] x Wt.)  *IF ITEM IS CHECKED "YES" TAKE IMMEDIATE PRECAUTIONS  SCORE KEY  PLEASE INDICATE SCORE WITH "X" IN APPROPRIATE BOX  High Probability 21 or Greater High Risk    Medium Probability 11 to 20 Moderate Risk    Low Probability 10 or Less Lower Risk X     Attitude: cooperative   Eye Contact: poor   Hygiene: fair   Consciousness: alert   Dress: appropriate   Orientation: Person, Place, Time and Situation   Mood: depressed   Affect: blunted   Self Concept:: helpless   Speech: is soft   Thought:: shows no evidence of impairment   Processing: concrete   Memory: intact   Judgement/Insight: fair   Fund of Knowledge: Intact   Intelligence: Average   SLUMS SCORE:                                (All patients with cognitive impairment and/or 65+ yo)     Initial Diagnostic Impression (Preliminary)   Axis I:   Axis II:   Axis III:   Axis IV:   Axis V:    Depression (0-10): 12   Anxiety (0-10):10   Suicide Risk Score: MOD   Violence/Aggression Risk Score: 0   Alcohol Audit Score: 0   Time Assessment Completed:  0000

## 2016-09-21 NOTE — ED Notes (Signed)
Security at bedside.

## 2016-09-21 NOTE — ED Notes (Signed)
Pt to XRay.

## 2016-09-21 NOTE — ED Notes (Signed)
Intake Coordinator at bedside.

## 2016-09-22 ENCOUNTER — Inpatient Hospital Stay
Admit: 2016-09-22 | Discharge: 2016-09-25 | Disposition: A | Discharge status: Home / Self Care | Payer: PRIVATE HEALTH INSURANCE | Attending: Psychiatry | Admitting: Psychiatry

## 2016-09-22 LAB — DRUG SCREEN, URINE
AMPHETAMINES: NEGATIVE
BARBITURATES: NEGATIVE
BENZODIAZEPINES: NEGATIVE
COCAINE: NEGATIVE
METHADONE: NEGATIVE
OPIATES: POSITIVE
PCP(PHENCYCLIDINE): NEGATIVE
THC (TH-CANNABINOL): POSITIVE
TRICYCLICS: NEGATIVE

## 2016-09-22 LAB — THYROID PANEL W/TSH
Free thyroxine index: 3.6 (ref 1.4–5.2)
T3 Uptake: 34 % (ref 31–39)
T4, Total: 10.6 ug/dL (ref 4.7–13.3)
TSH: 2.31 u[IU]/mL (ref 0.35–3.74)

## 2016-09-22 LAB — CBC WITH AUTOMATED DIFF
ABS. BASOPHILS: 0 10*3/uL (ref 0.0–0.1)
ABS. EOSINOPHILS: 0.1 10*3/uL (ref 0.0–0.5)
ABS. LYMPHOCYTES: 2.9 10*3/uL (ref 0.8–3.5)
ABS. MONOCYTES: 0.5 10*3/uL — ABNORMAL LOW (ref 0.8–3.5)
ABS. NEUTROPHILS: 6.8 10*3/uL (ref 1.5–8.0)
BASOPHILS: 0 % (ref 0–2)
EOSINOPHILS: 1 % (ref 0–5)
HCT: 39.6 % — ABNORMAL LOW (ref 41–53)
HGB: 13.2 g/dL (ref 12.0–16.0)
LYMPHOCYTES: 28 % (ref 19–48)
MCH: 31 PG (ref 27–31)
MCHC: 33.3 g/dL (ref 31–37)
MCV: 93 FL (ref 80–100)
MONOCYTES: 5 % (ref 3–9)
MPV: 8.5 FL (ref 5.9–10.3)
NEUTROPHILS: 66 % (ref 40–74)
NRBC: 0 PER 100 WBC
PLATELET: 207 10*3/uL (ref 130–400)
RBC: 4.26 M/uL (ref 4.2–5.4)
RDW: 14.2 % (ref 11.5–14.5)
WBC: 10.3 10*3/uL (ref 4.5–10.8)

## 2016-09-22 LAB — METABOLIC PANEL, COMPREHENSIVE
A-G Ratio: 1 — ABNORMAL LOW (ref 1.2–2.2)
ALT (SGPT): 18 U/L (ref 12–78)
AST (SGOT): 26 U/L (ref 15–37)
Albumin: 3.5 g/dL (ref 3.4–5.0)
Alk. phosphatase: 117 U/L (ref 45–117)
Anion gap: 9 mmol/L (ref 6–15)
BUN/Creatinine ratio: 13 (ref 7–25)
BUN: 8 MG/DL (ref 7–18)
Bilirubin, total: 0.3 MG/DL (ref <1.1)
CO2: 24 mmol/L (ref 21–32)
Calcium: 8.3 MG/DL — ABNORMAL LOW (ref 8.5–10.1)
Chloride: 109 mmol/L — ABNORMAL HIGH (ref 98–107)
Creatinine: 0.6 MG/DL (ref 0.60–1.30)
GFR est AA: >60 mL/min/{1.73_m2} (ref >60)
GFR est non-AA: >60 mL/min/{1.73_m2} (ref >60)
Globulin: 3.6 g/dL — ABNORMAL HIGH (ref 2.4–3.5)
Glucose: 82 mg/dL (ref 70–110)
Potassium: 3.9 mmol/L (ref 3.5–5.3)
Protein, total: 7.1 g/dL (ref 6.4–8.2)
Sodium: 142 mmol/L (ref 136–145)

## 2016-09-22 LAB — MAGNESIUM: Magnesium: 2.1 mg/dL (ref 1.8–2.4)

## 2016-09-22 LAB — HCG QL SERUM: HCG, Ql.: NEGATIVE

## 2016-09-22 LAB — ETHYL ALCOHOL: ALCOHOL(ETHYL),SERUM: 123 MG/DL

## 2016-09-22 LAB — SALICYLATE: Salicylate level: 4.7 MG/DL (ref 2.8–20.0)

## 2016-09-22 LAB — ACETAMINOPHEN: Acetaminophen level: <2 ug/mL — ABNORMAL LOW (ref 10–30)

## 2016-09-22 MED ORDER — ALBUTEROL SULFATE HFA 90 MCG/ACTUATION AEROSOL INHALER
90 mcg/actuation | RESPIRATORY_TRACT | Status: DC | PRN
Start: 2016-09-22 — End: 2016-09-25
  Administered 2016-09-23 – 2016-09-24 (×3): via RESPIRATORY_TRACT

## 2016-09-22 MED ORDER — TRAZODONE 150 MG TAB
150 mg | Freq: Every evening | ORAL | Status: AC | PRN
Start: 2016-09-22 — End: 2016-09-22
  Administered 2016-09-22: 05:00:00 via ORAL

## 2016-09-22 MED ORDER — MULTIVITAMIN TAB
Freq: Every day | ORAL | Status: DC
Start: 2016-09-22 — End: 2016-09-25
  Administered 2016-09-22 – 2016-09-25 (×4): via ORAL

## 2016-09-22 MED ORDER — MAGNESIUM HYDROXIDE 2,400 MG/10 ML ORAL SUSP
2400 mg/10 mL | Freq: Four times a day (QID) | ORAL | Status: DC | PRN
Start: 2016-09-22 — End: 2016-09-25

## 2016-09-22 MED ORDER — HYDROXYZINE PAMOATE 50 MG CAP
50 mg | Freq: Four times a day (QID) | ORAL | Status: DC | PRN
Start: 2016-09-22 — End: 2016-09-25
  Administered 2016-09-22 – 2016-09-25 (×8): via ORAL

## 2016-09-22 MED ORDER — LEVOTHYROXINE 112 MCG TAB
112 mcg | Freq: Every day | ORAL | Status: DC
Start: 2016-09-22 — End: 2016-09-25
  Administered 2016-09-22 – 2016-09-25 (×4): via ORAL

## 2016-09-22 MED ORDER — ALUM-MAG HYDROXIDE-SIMETH 400 MG-400 MG-40 MG/5 ML ORAL SUSP
400-400-40 mg/5 mL | ORAL | Status: DC | PRN
Start: 2016-09-22 — End: 2016-09-25

## 2016-09-22 MED ORDER — NICOTINE 21 MG/24 HR DAILY PATCH
21 mg/24 hr | Freq: Every day | TRANSDERMAL | Status: DC | PRN
Start: 2016-09-22 — End: 2016-09-25

## 2016-09-22 MED ORDER — THIAMINE 100 MG/ML INJECTION
100 mg/mL | Freq: Once | INTRAMUSCULAR | Status: AC
Start: 2016-09-22 — End: 2016-09-22

## 2016-09-22 MED FILL — MULTIVITAMIN TAB: ORAL | Qty: 1

## 2016-09-22 MED FILL — PROAIR HFA 90 MCG/ACTUATION AEROSOL INHALER: 90 mcg/actuation | RESPIRATORY_TRACT | Qty: 8.5

## 2016-09-22 MED FILL — HYDROXYZINE PAMOATE 50 MG CAP: 50 mg | ORAL | Qty: 1

## 2016-09-22 MED FILL — NICOTINE 21 MG/24 HR DAILY PATCH: 21 mg/24 hr | TRANSDERMAL | Qty: 1

## 2016-09-22 MED FILL — SODIUM CHLORIDE 0.9 % IV: INTRAVENOUS | Qty: 1000

## 2016-09-22 MED FILL — LEVOTHYROXINE 112 MCG TAB: 112 mcg | ORAL | Qty: 1

## 2016-09-22 MED FILL — TRAZODONE 150 MG TAB: 150 mg | ORAL | Qty: 1
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
## 2016-09-22 NOTE — Behavioral Health Treatment Team (Signed)
Admitted to the services of Dr Ramiro [facility]ikh [facility]  Patient has previous diagnosis of SI.    Patient presented to ER with family for SI  The patient reports "hearing ringing and melody's and "seeing shadow people"   Pt. Appears Appropriately dressed but lacking in hygiene.  Pt has poor eye contact    Pt reports sleeping only 2-3 hours a day.  Pt reports eating porrly   Weight loss of  20- 25 lbs in the last month  Pt states that she doesn't go to the Dr because of cost but [facility]   Depression 12 out of 10 .  Anxiety 12 out of 10   Pt currently having SI of shooting self but commits to safety  Dual skin assessment completed per medical staff..  Inpatient order verified and correct in connect care, consent for treatment signed by patient (OR LEGAL GUARDIAN/POA).    ID verified and armband applied per protocol.  Unit rules, reviewed and patient verbalized understanding.    All personal belongings identified and secured.    Body search completed no contraband found.    Patient wanded and brought onto unit per PCT    Unit tour given and assigned room 110   Further orders processing, and will continue to monitor.

## 2016-09-22 NOTE — H&P (Signed)
H and P already dictated [phone]

## 2016-09-22 NOTE — Behavioral Health Treatment Team (Signed)
Unable to obtain pt's PTA medication at this time
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
## 2016-09-22 NOTE — Behavioral Health Treatment Team (Addendum)
Patient is asleep in her room at this time. No s/s of distress is noted.  Appearance is unkept.

## 2016-09-22 NOTE — Behavioral Health Treatment Team (Signed)
[facility]ANDARD ADMIT ORDERS, COWS PROTOCOL.  ER NOTIFIED, BH CHARGE NOTIFIED.

## 2016-09-22 NOTE — ED Notes (Signed)
Dr Smutko at bedside

## 2016-09-22 NOTE — Treatment Summary (Signed)
[facility]  [facility]  Inpatient Behavioral Health Services  INITIAL Treatment Plan        Date of Admit:  09/21/2016   Patient's Name    Amber EisenmengerDebra Sue [name] DOB:  1965-12-26 Date Plan Initiated:    09/22/16 MRN:  [id number]     Admission Dx: Suicidal Ideations     Problem  Number PROBLEM  Include Significant Medical Issues Date Established Status      A = Active  D = Deferred  I = Inactive  R = Resolved  M = Monitoring   1. Suicidal Ideations 09/22/16 A   2 Depression 09/22/16 A   3 Anxiety 09/22/16 A   4 Tobacco Use 09/22/16 A   5        Goals   Interventions/Modality         STG: Pt will remain safe for next 24 hours.    STG: Pt will verbalize all Suicidal and/or homicidal thoughts to staff for the next 24 hours.   STG: Pt will verbalize an understanding of possible symptoms and be able to identify at least 2 active symptoms.    STG: Pt will communicate the reason for admission and identify at least one goal for their treatment.    STG:Pt will verbalize an understanding of unit rules and expectations.    STG: Pt will communicate an understanding of one beginning symptoms of their Dx.     LTG: Pt will begin identifying at least one support system [employer](Community, family, peer, church official etc).   Nursing staff will monitored for safety including removing unsafe items, assessing current commitment for safety, and monitoring.    Nursing staff will provide medication as prescribed by physician at each prescribed time.    Nursing staff will complete vital assessment per shift to monitor medical symptoms.    Nursing staff will report symptoms assessed and identified during their shift at shift change to oncoming nurse.    Nursing staff will monitor observation type (1:1, elopment, etc)    Nursing staff will monitor precaution type (falls, suicide, etc)    Other:     Preliminary Discharge Plan  (outpatient, inpatient, substance abuse, mental health, etc): Outpatient    Preliminary Discharge Location (return home, name of nursing home, rehab facility etc):  Home   Staff Initiating Care Plan: Elita QuickPam,  RN

## 2016-09-22 NOTE — Behavioral Health Treatment Team (Signed)
[facility]  INPATIENT BEHAVIORAL HEALTH  NURSING PROGRESS NOTE  Remigio EisenmengerDebra Sue Karnes  09/22/2016;  [phone]      BEHAVIORAL ASSESSMENT  Patient is alert and oriented x 3. Patient is unkempt in appearance. Patient denies SI,HI, patient rates depression 10/10, and anxiety 10/10. Patient stating that she see's shadow people when she's at home. Patient also states that she hears a phone ringing and buzzing in her ears.  Upon introducing myself to the patient during assessment, patient immediately begins to tell me she her hips are hurting and she needs anxiety medications. Later patient asking for pain medications for her back. The patient came back shortly before midnight complaining of neck pain. Patient states that she has never had a stiff neck before and wonders if it can be her thyroids.    Sleep Assessment  Hours nightly slept: 4  Difficulty Falling Asleep: No  Difficulty Staying Asleep:Yes  Broken Sleep: Yes  Clinician's Observations : unremarkable    Appetite Assessment  Percentage of Patient's Food Consumption: 100% of evening snack.  Clinician's Observations: patient tolerated well.    SUICIDE RE-ASSESSMENT  In the past 12 hours have you had thoughts of hurting yourself: no  Patient states that her last thoughts of self harm were in August 2017.  Patient denies SI and commits to safety while in our care.    NURSING GROUP:  No group due to patient acuity    INTERVENTIONS      Treatment Plan Problem List & Correlating Goals/Interventions    Problem # Problem   1 Suicidal ideations   2 depression   3 anxiety   4 Tobacco use           CLIENT RESPONSE TO TREATMENT    Describe Patient Response to Master Treatment Plan's Goals/Interventions: patient denies SI while rating depression and anxiety 10/10. Patient using nicoderm CQ for nicotine withdrawal.

## 2016-09-22 NOTE — Behavioral Health Treatment Team (Signed)
[facility]  INPATIENT BEHAVIORAL HEALTH  NURSING PROGESS NOTE  Date:  09/22/2016  ??  ??  BEHAVIORAL ASSESSMENT:  Patient sleeping throughout the a.m.  Made several trips to patients room to check on her with patient sleeping and snoring each time.  Within 10 minutes of awakening patient was requesting medications for "high anxiety and leg pain."  Patient then responded, "I have not been able to sleep.  I have been tossing in my bed."  When it was discussed that assigned nurse was in her multiple times and witnessed her sleeping soundly patient stated, "Oh, I wasntt aware of that .  I guess I just wanted to keep??my anxiety from overwhelming me."  Limited interactions with staff and peers.  Appearance is unkept.  Patient has not requested any other meds.  Denies SI at this time.  Speech is clear and coherent and low in tone. Low energy level. Posture is guarded and eye contact is fleeting.        Appetite Assessment  Percentage of Patient's Food Consumption: only a few bites of food at breakfast and 100% at lunch.   Clinician's Observations:  Feed self.   ??  SUICIDE RE-ASSESSMENT  In the past 12 hours have you had thoughts of hurting yourself: Yes  (If no when was the last time you have had thoughts of suicide): Yesterday  Explain the thoughts:  "I just knew I couldn't take it anymore."  What plan does the patient have: "denied plan at this time."  On scale of 1-10 how does patient rate suicidal thoughts: 2.   ??  ??  NURSING GROUP:  TOPIC OF GROUP: Thoughts in your head  Leader of Group: Robin, RN  START TIME: 1320  STOP TIME: 1420  Patient Participation: Participated  Describe Patient's involvement in group: Participated well.       ??    INTERVENTIONS    Treatment Plan Problem List & Correlating Goals/Interventions  ??  Problem # Problem   1. Suicidal Ideations   2. Depression   3. Anxiety   4. Tobacco Use   ?? ??   ??  ??  CLIENT RESPONSE TO TREATMENT  ??   Describe Patient Response to Master Treatment Plan's Goals/Interventions: Good     Readiness for change: Fair    PLAN  1:1 interaction provided therapeutic listening, interactive/commmunication with patient, and educated patient on coping skills to deal with harmful thoughts and impulses.?? Current coping skills assessed.?? Educated and encouraged patient to comply with treatment plan.?? Administered medications as prescribed.?? Teaching done on all medications administered. Maintained patient safety this shift.
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
## 2016-09-22 NOTE — Behavioral Health Treatment Team (Signed)
[facility]  BEHAVIORAL HEALTH SERVICES  INPATIENT PSYCHOSOCIAL HISTORY    Psychosocial History Template Form    Admission Status:   Voluntary     Involuntary   Hold began: 09/21/2016  2300  Biographical Information:  50 year old female.   Presenting Problem:  Pt presented with thoughts of suicide without a plan.  States she has been feeling this way for years.  Pt states she sees "shadow people" and has for years.     Mental Health History:  Pt made a suicide attempt in 2001.  [facility].    Substance Abuse History:  Pt denies.     AUDIT Screen Score:  0                Risk Assessment:   SUICIDE RISK SCREEN   Place "X" by each necessary risk factor in "Risk" Box   RISK FACTOR LOWER RISK MILD RISK MODERATE RISK HIGH RISK   1. Intent/Ambience No intent to die Minimal Intent Moderate Intent Clear Intent   2.Lethality of attempt (or Plan) None/Ideation Only Arly.Keller[X ] Gesture [ ]  Non-lethal [ ]  Potentially lethal (esp., firearm, hanging, OD) [ ]    3. Prior Attempts 2-10 years ago Arly.Keller[X ] ?? 6-12 mos ago [ ]  1wk-6 mos ago  [ ]    4. Hopelessness Hopeful [ ]  ?? Ambivalent Arly.Keller[X ] Hopeless [ ]    5. Substance Abuse None [ X] ?? Abuse [ ]  Dependence [ ]    6. Support System Good Support [ ]  ?? Conflicted Arly.Keller[X ] None [ ]    7. Current Stressor Severity None [ ]  ?? Moderate Arly.Keller[X ] Severe [ ]    8. Loss & Trauma (Past 6 Mos) None Arly.Keller[X ] ?? Serious [ ]  Multiple [ ]    9. Gender Female [ X] ?? Female [ ]  ??   10. Age 721-15 [ ]  15-24 [ ]  24-69 Arly.Keller[X ] 70+ [ ]    7411. Marital Status Married/Partner   Arly.Keller[X ] Single [ ]  Divorced [ ]  Widowed [ ]    8012. Sexual Orientation Heterosexual Arly.Keller[X ] ?? LBGQT [ ]  ??   13. Ethnicity Non-white/Black [ ]  ?? White [ X] ??   14. Chronic/Severe/Illness and/or Functional Impairment None Arly.Keller[X ] Acute Illness and/or mild functional impairment [ ]  Chronic Illness and/or mild functional impairment [ ]  Chronic Illness and/or moderate-to-severe functional impairment [ ]     15. Level of Insomnia None [ X] 4-5 Hours of Sleep [ ]  1-3 Hours of Sleep  [ ]  No Sleep [ ]    ??  Note:  Any 2 or more in any category triggers the higher level of risk.  Please summarize your findings and indicate what factors contributed to the patients level of risk:    ??  ??  MODERATE     Explain (Provide summary of suicide risk chart): Moderate  - Aggression/Violence:  0  - Substance Abuse:  0  - Trauma History and Treatment Considerations: None  .Counter indications to restraints/code 10 procedures:    Advance Directives:   - Legal Guardian/Power of Attorney in effect:  Self  - Psychiatric Advance Directives         . Patient???s treatment preference when behavior becomes unmanageable    Family History of Mental Illness/Substance Abuse:     Current Living Situation:    Marital History/Family History:  Pt states she is going through a divorce.  Education History and Assessment:    - How does the patient learn    - Treatment Considerations    Employment History:    Military History:    Legal History:    Childhood History and Milestones:    Sexual Orientation:      Religious Identification and Beliefs/Values:    - Treatment Considerations    Social and Cultural History:    - Nutritional Needs  - Treatment Considerations      Current Mental Status and Functional Assessment:    - Treatment Considerations  - SLUMS Score ____________    Motivation Towards Change:    Family Involvement in Treatment:    - Family motivation to change    Patient Strengths and Limitations:  Strengths:  Good supports in place.         Diagnostic Summary and Treatment Considerations:  - Your summary needs to include all assessments  ? Intake Tool and Screenings  ? Nursing Assessment  ? H&P  ? Psych Evaluation  ? Activity Therapist Assessment  ? Also, Summarize the Plan

## 2016-09-22 NOTE — ED Notes (Signed)
Patient's husband presents to triage office and informs me that she has taken 19 Clonopin over the past 4 days.  States that she just got her prescription filled for 90 count of Clonopin and there are 79 remaining.

## 2016-09-22 NOTE — Other (Signed)
TRANSFER - OUT REPORT:    Verbal report given to Josh, RN (name) on Remigio EisenmengerDebra Sue Walls  [facility] for routine progression of care       Report consisted of patient???s Situation, Background, Assessment and   Recommendations(SBAR).     Information from the following report(s) SBAR, ED Summary, [facility] and Recent Results was reviewed with the receiving nurse.    Lines:       Opportunity for questions and clarification was provided.      Patient transported with:  Security  Intake Coordinator  [employer]

## 2016-09-23 LAB — LIPID PANEL
Cholesterol, total: 193 MG/DL (ref <200)
HDL Cholesterol: 43 MG/DL (ref 32–96)
LDL, calculated: 119.76 MG/DL (ref <130)
Triglyceride: 189 MG/DL — ABNORMAL HIGH (ref <150)
VLDL, calculated: 37.8 MG/DL — ABNORMAL HIGH (ref 5–32)

## 2016-09-23 LAB — GLUCOSE, RANDOM: Glucose: 84 mg/dL (ref 70–110)

## 2016-09-23 MED ORDER — CITALOPRAM 20 MG TAB
20 mg | Freq: Every day | ORAL | Status: DC
Start: 2016-09-23 — End: 2016-09-25
  Administered 2016-09-23 – 2016-09-25 (×3): via ORAL

## 2016-09-23 MED ORDER — ACETAMINOPHEN 325 MG TABLET
325 mg | Freq: Four times a day (QID) | ORAL | Status: DC | PRN
Start: 2016-09-23 — End: 2016-09-25
  Administered 2016-09-23 – 2016-09-25 (×6): via ORAL

## 2016-09-23 MED ORDER — RISPERIDONE 1 MG TAB
1 mg | Freq: Two times a day (BID) | ORAL | Status: DC
Start: 2016-09-23 — End: 2016-09-25
  Administered 2016-09-23 – 2016-09-25 (×5): via ORAL

## 2016-09-23 MED FILL — ACETAMINOPHEN 325 MG TABLET: 325 mg | ORAL | Qty: 2

## 2016-09-23 MED FILL — RISPERIDONE 1 MG TAB: 1 mg | ORAL | Qty: 1

## 2016-09-23 MED FILL — MULTIVITAMIN TAB: ORAL | Qty: 1

## 2016-09-23 MED FILL — CITALOPRAM 20 MG TAB: 20 mg | ORAL | Qty: 1

## 2016-09-23 MED FILL — HYDROXYZINE PAMOATE 50 MG CAP: 50 mg | ORAL | Qty: 1

## 2016-09-23 MED FILL — LEVOTHYROXINE 112 MCG TAB: 112 mcg | ORAL | Qty: 1

## 2016-09-23 NOTE — Other (Signed)
BEHAVIORAL HEALTH  ACTIVITY SPECIALIST ASSESSMENT    Admission Date: 09/21/16 D/X: [employer]     Evaluation Date: 09/23/2016 Evaluation Time: 0900 Precautions: None     Physical Function ADL: self-feeding, grooming, bathing, upper body dressing, lower body dressing, toileting, toilet transfer, shower transfer, tub transfer and simple home management Ambulatory: Yes     Place "X" next to selection.  Assistive Function [ ]   Walker [ ]  Cane [ ]  Wheelchair [ ]  Other     Place "X" next to selection.  Following Directions Attention Span Motivation Level   [ ]  Unable to follow simple commands. [ ]  5 Minutes or Less [ ]  Maximum encouragement needed.   [ ]  Able to follow simple commands. [ ]  5-15 Minutes [ ]  Moderate encouragement needed.   [x ] Able to follow multi-step commands. [x ] 15-30 Minutes [x ] Minimum encouragement needed.    [ ]  60 Minutes or More [ ]  No encouragement needed.     Patient's Living Situation: With spouse    Past Interests & Hobbies Current Interests & Hobbies   1.Playing with friends 1.Dancing   2. 2.Shooting pool   3. 3.TV  Music     What does the patient do to cope with presenting problem (i.e. Stress, Depression, Trigger, etc.): Smoke a cigarette    Place "X" next to selection; Specify Leisure Activities & Barriers.  Enjoyed Leisure Activities Barriers to Enjoyed Leisure Activities   [x ] Social:  [ ]  Mobility:    [x ] Solitary:  [ ]  Endurance:    [x ] Physical:  [ ]  Coordination:    [ ]  Creative:  [ ]  Flexibility:    [ ]  Outdoor:  [x ] Finance:    [ ]  Spectator:  [ ]  Time Availability:    [x ] Passive:  [ ]  Transportation:    [ ]  Other:  [ ]  Cognitive:     [ ]  Physical Disability:     [ ]  Other:      Does patient believe these activities are beneficial to their overall well-being: Yes    Place "X" next to selection.  Motivators for Recreation/Leisure Involvement     [ ]  Relaxation [x ] Fun/Entertainment   [ ]  Physical Benefits [ ]  Intellectual Stimulation    [ ]  Social Interaction [ ]  Creative Expression   [ ]  Self Esteem or Sense of Accomplishment      Place "X" next to selection.  Patient Strengths & Areas of Potential     [ ]  Interests & Hobbies [ ]  Insight into Leisure Needs   [ ]  Reality Oriented [ ]  Motivated for Change   [ ]  Social Support [ ]  Good Physical Health   [ ]  Education [ ]  Positive Coping Skills     Place "X" next to selection  Patient Educational Needs     [ ]  Self-Esteem [ ]  Relaxation   [ ]  Depression [ ]  Social Skills   [ ]  Anger Control [ ]  Grief & Loss   [ ]  Leisure Awareness [ ]  Stress Management   [ ]  Leisure Education [ ]  Coping Skills     Are there any leisure activities the patient is interested in upon discharge: No: N/A. Explain: N/A    Arrangements staff can make to help facilitate these activities: N/A    Lance B. Sydnor  ______________________________  Activities Specialist  09/23/2016  09/21/16

## 2016-09-23 NOTE — Other (Signed)
[facility]  Group Note  [address]  [place], [place]?? [address]  ????  ????  ????  Date of service: 09/23/16  ????  Start time: 3pm  Stop time: 4pm  ????  Type of session: Recreational Activity Group  ????  Problem number:Movie/ Socialization  ????  Short term goal (STG): Pt will attend recreation/activity group and socialize appropriately with AT and other pt's while watching a movie.  ????  Intervention/techniques: AT initiated and??encouraged patients to socialize appropriately with one another.  ????  Patient mental status/affect: Calm. Actively participating. Enjoying activity.  ????  Patient behavior/appearance: Calm and Neatly Groomed  ????  Special patient treatment accommodations provided (describe): None needed  ????  Patient response and progress towards goals: Pt is responding and progressing??towards goal by attending group and socializing appropriately with AT and others while watching a movie.  ????  ????  Lance B. Sydnor

## 2016-09-23 NOTE — Treatment Summary (Signed)
[facility]  [facility]  Inpatient Behavioral Health Services  Master Treatment Plan ??   ??                     INPATIENT MASTER TREATMENT PLAN   Date of Adm:  09/23/2016 Signature/Credential of Staff Initiating:  ??  Amber [name], [place] Date Plan Initiated:  ??  09/23/16 MTP plan initiated date/time:  ??  09/23/16 Anticipated Date of Discharge:  ??  09/26/2016 Legal Status:   - Voluntary   - Involuntary   DIAGNOSIS   AXIS I: Unspecified Bipolar Disorder and related disorders   ????????????????????Rule out Major Depressive Disorder with psychotic features   ????????????????????History of Benzodiazepine Use Disorder  ??AXIS II: Cluster B Trait  ????????????????????Possibly Borderline Personality Disorder   AXIS III: History of hypertension, angina   AXIS IV: ??Psychosocial Stressors: ??Marital problems, history of non-compliance with medication and follow-up, poor coping skills, poor problem solving skills, chemical dependency (marijuana)  ??AXIS V: ??Current GAF: ??16 to 20   ?? ASSESSMENTS ??   ?? Suicide Risk Score:  High  Violence Risk:   11 Moderate  Alcohol (AUDIT) Score:  1 low  Tobacco: yes  Slum Score: n/a  Current Suicide Assessment: High ?? ??   INVENTORY OF PATIENT STRENGTHS / LIMITATIONS       (At least 2 of each)S = Strength               L = Limitation)   S L Item S L Item S L Item     Verbal Expression   Family Support   Financial Status     Physical Status   Social Supports   Employment Status     Degree of Insight   Education/Cognition   Prior Response to Treatment     Ambulation/Transportation   Motivation   Other:    DISCHARGE PLAN   ??  ????   Home alone   ????   Home with family spouse  ????   Home other than family ?????  ????   Group home ?????  ????   Assisted apartment living ?????  ????   Nursing home ?????  ????   Rest home ?????  ????   Assisted living ?????  ????   Family care home ?????  ????   Homeless shelter ?????  ????   Residential treatment ?????  ????   Shelter: ?????  ????   Correctional Facility: ?????  ????    Other hospital: ????? ????   [facility]  ????   Day treatment at ?????  ????   Individual therapy with ?????  ????   Family therapy with ?????  ????   Case management with ?????  ????   Substance abuse treatment   ????   Respite ?????  ????   Mental health center Dr. Welton Flakes  ????   Vocational rehabilitation ?????  ????   Medication management with Dr. ?????  ????   Recovery Support Groups: ?????  ????   Other: ????? ????   Home health with ?????  ????   Home medical equipment ?????  ????   Medical services with Dr. ?????  ????   Nutritional needs : ?????  ????   Other Referrals:   ????     ????  Other Community Resource Needs/Referrals: (Describe Below):  ????   Educational needs :    ????   Recreational/Social Referrals: ?????   ??  ??  ??  ??  PROBLEM LIST    Problem  Number PROBLEM  Include Significant Medical Issues Date Established Status As Evidenced by:    ??   A = Active  D = Deferred  I = Inactive  R = Resolved  M = Monitoring    1 Suicidal Ideation 09/23/16 A As evidenced by, pt having a plan to flip her car.   2 Depression 09/23/16 A As evidenced by, pt rating as 10/10.   3 Anxiety 09/23/16 A As evidenced by, pt rating as 10/10.   4 Hypertension 09/23/16 A As evidenced by, pt history (BP 158/103).   5 Hypothyroidism 09/23/16 D As evidenced by, pt history.   6 IBS 09/23/16 D As evidenced by pt history.   7 Tobacco use 09/23/16 A As evidenced by pt reporting smoking 2 ppd.      Problem  Number Goals Interventions/Modality Target Date/Progression   ??      ??  1 LTG: Pt will have no SI for 24 hours prior to discharge Target Date: 09/25/16  Progress:    ??  ?? STG: Pt will take medications as prescribed.   Nursing will provide at least 1 educational group per day for 50 minutes.    Recreation will provide at least 1 recreational group per day for 50 minutes.    Therapy will provide at least 1 therapy group session per day for 50 minutes    PCT will provide at least 1 group session per day for 50 minutes.     Nursing staff will offer a Daily wrap up/reflection group for at least 30 minutes a day.     Physician will prescribe medications as needed based on daily evaluations.    Nursing staff will complete 15 minute checks on pt.     Physician will order 1:1 staffing for pt for suicide precautions.    Physician will order within line of sight supervision.    Physician will put pt on fall precautions.    Therapy will assist pt in identifying appropriate follow up plan.    Therapy will complete family conference     Other:  Target Date:  09/26/16  ??  Progression:   ?? STG: Pt will identify at least 3 reasons to live.   Nursing will provide at least 1 educational group per day for 50 minutes.    Recreation will provide at least 1 recreational group per day for 50 minutes.    Therapy will provide at least 1 therapy group session per day for 50 minutes    PCT will provide at least 1 group session per day for 50 minutes.    Nursing staff will offer a Daily wrap up/reflection group for at least 30 minutes a day.     Physician will prescribe medications as needed based on daily evaluations.    Nursing staff will complete 15 minute checks on pt.    Physician will order 1:1 staffing for pt for suicide precautions.    Physician will order within line of sight supervision.    Physician will put pt on fall precautions.    Therapy will assist pt in identifying appropriate follow up plan.    Therapy will complete family conference     Other: Target Date: 09/26/16  ??  Progression:   ??  2 LTG: Patient will report depression at a 3/10 or lower prior to discharge.  Target Date: 09/26/16  Progress:   ??  ?? STG: Patient will identify 2 triggers to depression.  Nursing will provide at least 1 educational group per day for 50 minutes.    Recreation will provide at least 1 recreational group per day for 50 minutes.    Therapy will provide at least 1 therapy group session per day for 50 minutes     PCT will provide at least 1 group session per day for 50 minutes.    Nursing staff will offer a Daily wrap up/reflection group for at least 30 minutes a day.     Physician will prescribe medications as needed based on daily evaluations.    Nursing staff will complete 15 minute checks on pt.    Physician will order 1:1 staffing for pt for suicide precautions.    Physician will order within line of sight supervision.    Physician will put pt on fall precautions.    Therapy will assist pt in identifying appropriate follow up plan.    Therapy will complete family conference     Other: Target Date: 09/26/16  ??  Progression:   ?? STG: Patient will identify 2 coping/relaxation strategies.    Nursing will provide at least 1 educational group per day for 50 minutes.    Recreation will provide at least 1 recreational group per day for 50 minutes.    Therapy will provide at least 1 therapy group session per day for 50 minutes    PCT will provide at least 1 group session per day for 50 minutes.    Nursing staff will offer a Daily wrap up/reflection group for at least 30 minutes a day.     Physician will prescribe medications as needed based on daily evaluations.    Nursing staff will complete 15 minute checks on pt.    Physician will order 1:1 staffing for pt for suicide precautions.    Physician will order within line of sight supervision.    Physician will put pt on fall precautions.    Therapy will assist pt in identifying appropriate follow up plan.    Therapy will complete family conference     Other: Target Date: 09/26/16  ??  Progression:   ??  3 LTG: Patient will report anxiety at a 3/10 or lower prior to discharge.  Target Date: 09/26/16  Progress:   ??  ?? STG: Patient will identify 2 triggers to anxiety.    Nursing will provide at least 1 educational group per day for 50 minutes.    Recreation will provide at least 1 recreational group per day for 50 minutes.     Therapy will provide at least 1 therapy group session per day for 50 minutes    PCT will provide at least 1 group session per day for 50 minutes.    Nursing staff will offer a Daily wrap up/reflection group for at least 30 minutes a day.     Physician will prescribe medications as needed based on daily evaluations.    Nursing staff will complete 15 minute checks on pt.    Physician will order 1:1 staffing for pt for suicide precautions.    Physician will order within line of sight supervision.    Physician will put pt on fall precautions.    Therapy will assist pt in identifying appropriate follow up plan.    Therapy will complete family conference     Other: Target Date: 09/26/16  ??  Progression:   ?? STG: Patient will identify 2 coping/relaxation strategies.    Nursing will provide at least 1 educational group per day  for 50 minutes.    Recreation will provide at least 1 recreational group per day for 50 minutes.    Therapy will provide at least 1 therapy group session per day for 50 minutes    PCT will provide at least 1 group session per day for 50 minutes.    Nursing staff will offer a Daily wrap up/reflection group for at least 30 minutes a day.     Physician will prescribe medications as needed based on daily evaluations.    Nursing staff will complete 15 minute checks on pt.    Physician will order 1:1 staffing for pt for suicide precautions.    Physician will order within line of sight supervision.    Physician will put pt on fall precautions.    Therapy will assist pt in identifying appropriate follow up plan.    Therapy will complete family conference     Other: Target Date: 09/26/16  ??  Progression:   ??          4 LTG: Pt's HTN will be stabilized for 24 hours prior to discharge. Target Date: 09/25/16  Progress:   ??  ?? STG- Pt will be 100% compliant with blood pressure monitoring.    Nursing will provide at least 1 educational group per day for 50 minutes.     Recreation will provide at least 1 recreational group per day for 50 minutes.    Therapy will provide at least 1 therapy group session per day for 50 minutes    PCT will provide at least 1 group session per day for 50 minutes.    Nursing staff will offer a Daily wrap up/reflection group for at least 30 minutes a day.     Physician will prescribe medications as needed based on daily evaluations.    Nursing staff will complete 15 minute checks on pt.    Physician will order 1:1 staffing for pt for suicide precautions.    Physician will order within line of sight supervision.    Physician will put pt on fall precautions.    Therapy will assist pt in identifying appropriate follow up plan.    Therapy will complete family conference     Nursing staff will monitor symptoms and provide medical interventions when needed.    Other:  Target Date: 09/26/16  ??  Progression:   ??  ?? STG- Pt will be 100% compliant with medication to control blood pressure.   Nursing will provide at least 1 educational group per day for 50 minutes.    Recreation will provide at least 1 recreational group per day for 50 minutes.    Therapy will provide at least 1 therapy group session per day for 50 minutes    PCT will provide at least 1 group session per day for 50 minutes.    Nursing staff will offer a Daily wrap up/reflection group for at least 30 minutes a day.     Physician will prescribe medications as needed based on daily evaluations.    Nursing staff will complete 15 minute checks on pt.    Physician will order 1:1 staffing for pt for suicide precautions.    Physician will order within line of sight supervision.    Physician will put pt on fall precautions.    Therapy will assist pt in identifying appropriate follow up plan.    Therapy will complete family conference     Nursing staff will monitor symptoms and  provide medical interventions when needed.  Other: Target Date: 09/26/16  ??  Progression:   ??   7 LTG: Pt will abstain from tobacco use 100% of the time during hospitalization.  Target Date: 09/26/16  Progression:   ??  ?? STG-Pt will utilize nicotine replacement options during hospitalization.    Nursing will provide at least 1 educational group per day for 50 minutes.    Recreation will provide at least 1 recreational group per day for 50 minutes.    Therapy will provide at least 1 therapy group session per day for 50 minutes    PCT will provide at least 1 group session per day for 50 minutes.    Nursing staff will offer a Daily wrap up/reflection group for at least 30 minutes a day.     Physician will prescribe medications as needed based on daily evaluations.    Nursing staff will complete 15 minute checks on pt.    Physician will order 1:1 staffing for pt for suicide precautions.    Physician will order within line of sight supervision.    Physician will put pt on fall precautions.    Therapy will assist pt in identifying appropriate follow up plan.    Therapy will complete family conference     Nursing staff will monitor symptoms and provide medical interventions when needed.    Other:  Target Date: 09/26/16  ??  Progression:   ??  ?? STG- Pt will verbalize an understanding of the effects of tobacco use at least 1x prior to discharge.   Nursing will provide at least 1 educational group per day for 50 minutes.    Recreation will provide at least 1 recreational group per day for 50 minutes.    Therapy will provide at least 1 therapy group session per day for 50 minutes    PCT will provide at least 1 group session per day for 50 minutes.    Nursing staff will offer a Daily wrap up/reflection group for at least 30 minutes a day.     Physician will prescribe medications as needed based on daily evaluations.    Nursing staff will complete 15 minute checks on pt.    Physician will order 1:1 staffing for pt for suicide precautions.    Physician will order within line of sight supervision.     Physician will put pt on fall precautions.    Therapy will assist pt in identifying appropriate follow up plan.    Therapy will complete family conference     Nursing staff will monitor symptoms and provide medical interventions when needed.    Other: Target Date: 09/26/16  ??  Progression:   ??  ??  ??  PATIENT INVOLVEMENT IN TREATMENT PLAN      Treatment Plan was developed with input from and discussed with patient/guardian/conservator/designee?   - Yes, completed with staff during individual session   - Yes, completed during treatment planning team meet   - No, please explain:   ??  Motivation for Treatment (in patient's words):   Patient's goal for treatment (in patient's own words):   How will we know when you are ready to go? (in patient's own words):    ??  FAMILY/SIGNIFICANT OTHER INVOLVEMENT IN TREATMENT PLAN      Was family involved in the development of treatment plan:  ??   - Yes, family is involved   - No, Family not involved   - No known family   - Patient does not wish family involvement, explain   ??  Family's goal for treatment (in family's own words):

## 2016-09-23 NOTE — Progress Notes (Signed)
Report given to WESCO InternationalDebbie RN in SBAR fashion.

## 2016-09-23 NOTE — Behavioral Health Treatment Team (Signed)
[facility]  INPATIENT [facility]HAVIORAL HEALTH  NURSING PROGRESS NOTE  Amber EisenmengerDebra Sue [name]  09/23/2016; 1500      BEHAVIORAL ASSESSMENT  Alert and oriented x4.  Attending groups, interacting with other patients.  Unkempt, fair eye contact.  Inappropriately dressed.  Tylenol given 2x on day shift for c/o generalized pain.  States is effective. Denies SI, HI.  Complaint with meds.  States Improvement in depression and anxiety.  Eating well.       NURSING GROUP:    TOPIC OF GROUP  Leader of Group  START TIME  STOP TIME  Patient Participation(attended or refused)  Describe Patient's involvement in group        INTERVENTIONS      Treatment Plan Problem List & Correlating Goals/Interventions    Problem # Problem   1 Suicidal ideations   2 depression   3 anxiety   4 Tobacco use   ?? ??     1. Denies, On [id number] checks, sucide precautions  2. States improvement. Started on celexa.   3. States improvement. Started on celexa.   4. On nicotine patch.     CLIENT RESPONSE TO TREATMENT    Describe Patient Response to Master Treatment Plan's Goals/Interventions:   1.

## 2016-09-23 NOTE — Progress Notes (Signed)
Initial nutrition assessment completed secondary to patient having +MST screen upon adm. Current dx: Bipolar d/o, Suicidal ideations. PMH: COPD, HTN, Hyperlipidemia, GERD, Suicidal behavior. Current diet: Regular, po 50%. Ht: 5'5", CBW 54.2 kg (119 lb), BMI 19.87. Pt states having poor appetite and wt loss prior to adm, which is r/t marijuana use and increased alcohol consumption. Per EMR review, pt's wt 2 months ago was 115 lb. Meds: Milk of Magnesia. Pertinent labs WNL. Recommend to continue with current nutrition therapy at this time which remains appropriate for pt. RD will continue to monitor nutrition status, please consult prn.

## 2016-09-23 NOTE — Behavioral Health Treatment Team (Signed)
Psychiatry Progress Note    Date: 09/23/2016  Account Number:  [id number]  Name: Amber [name]      Subjective:     Patient seen. She I feeling better. She reports seeing shadows. She denies hearing voices, She has mood swing racing thought. Irritability. She denies suicidal thought. She denies homicidal thought.     Patient Active Problem List    Diagnosis Date Noted   ??? Suicidal behavior 07/25/2016   ??? Suicidal ideations 07/25/2016   ??? Bipolar disorder (HCC) 07/25/2016   ??? Bipolar 1 disorder, mixed (HCC) 12/30/2014   ??? Suicidal ideation 12/29/2014   ??? Seizures (HCC) 07/20/2014   ??? HTN (hypertension) 07/20/2014   ??? GERD (gastroesophageal reflux disease) 07/20/2014   ??? Tobacco abuse counseling 05/18/2014   ??? Hyperlipidemia 05/18/2014   ??? Lumbar pain with radiation down left leg 05/18/2014   ??? COPD (chronic obstructive pulmonary disease) (HCC) 03/24/2014   ??? Hypothyroidism 03/24/2014   ??? Tobacco abuse 03/24/2014   ??? Homicidal ideation 08/06/2013   ??? Hypokalemia 03/24/2013   ??? Fracture of ankle, bimalleolar, left, closed 03/23/2013   ??? Essential hypertension, benign 03/23/2013   ??? Anxiety state 03/23/2013     Class: Acute     Past Surgical History:   Procedure Laterality Date   ??? DELIVERY C-SECTION     ??? HX CHOLECYSTECTOMY      lap chole   ??? HX GI      colonoscopy, EGD   ??? HX ORTHOPAEDIC      trimalleolar fx repair 03/23/13   ??? HX OTHER SURGICAL      thyroidectomy april 2012   ??? HX OTHER SURGICAL      C/section with stillborn - under general anesthesia   ??? HX TUBAL LIGATION      lap hysterectomy      Allergies   Allergen Reactions   ??? Topamax [Topiramate] Anaphylaxis   ??? Ultram [Tramadol] Itching   ??? Nasal Spray [Sodium Chloride] Other (comments)     Migraine nasal spray makes her throat bleed   ??? Aspirin Other (comments)     Swelling "knot on side of neck"   ??? Ciprofloxacin Other (comments)     Causes facial redness     ??? Norvasc [Amlodipine] Hives   ??? Tamsulosin Swelling    ??? Levsin [Hyoscyamine Sulfate] Other (comments)     Blurred vision     ??? Pcn [Penicillins] Nausea and Vomiting      Social History   Substance Use Topics   ??? Smoking status: Current Every Day Smoker     Packs/day: 2.00     Years: 27.00   ??? Smokeless tobacco: Never Used   ??? Alcohol use 0.0 oz/week     0 Standard drinks or equivalent per week      Comment: rare      Family History   Problem Relation Age of Onset   ??? Arthritis-osteo Mother    ??? Cancer Mother    ??? Migraines Mother    ??? Headache Mother    ??? Heart Disease Mother    ??? Heart Disease Father    ??? Asthma Sister    ??? Lung Disease Brother    ??? Arthritis-osteo Maternal Grandmother    ??? Cancer Maternal Grandmother    ??? Diabetes Maternal Grandmother    ??? Hypertension Maternal Grandmother    ??? Stroke Maternal Grandmother    ??? Breast Cancer Maternal Grandmother    ??? Hypertension Maternal Grandfather    ???  Alcohol abuse Neg Hx    ??? Bleeding Prob Neg Hx    ??? Elevated Lipids Neg Hx    ??? Psychiatric Disorder Neg Hx    ??? Mental Retardation Neg Hx       Lori D McCoy, DO        Objective:         Patient Vitals for the past 8 hrs:   BP Temp Pulse Resp SpO2 Weight   09/23/16 0754 - - - - - 54.2 kg (119 lb 6.4 oz)   09/23/16 0709 114/62 98 ??F (36.7 ??C) 61 20 100 % -         Mental Status exam:     Pt appears stated age, kempt and casually dressed. Fair eye contact.  Psychomotor neutral. AAO X 3. Fair memory, concentration & attention span  Speech is articulated, normal rate, volume and quantity  Mood is anxious and affect is appropriate  TP is goal directed. TC No delusion, No suicidal/homicidal ideation, intent or plan  Perception, She has VH of shadows. Average intelligence and fund of knowledge  Impulse control, Insight and judgement is limited        Therapy notes nursing notes and labs in the past 24 hours reviewed and appreciated    Assessment/Plan:   Principal Problem:    Bipolar 1 disorder, mixed (HCC) (12/30/2014)    Active Problems:     COPD (chronic obstructive pulmonary disease) (HCC) (03/24/2014)      Tobacco abuse (03/24/2014)      Suicidal behavior (07/25/2016)          Assessment      Changes In Treatment Plan, Potential Side Effect and Benefit and alternatives discussed.The following information was reviewed and discussed:   the risks and benefits of the proposed medication   patient given opportunity to ask questions   off label use of an approved drug/prescription discussed with patient     Treatment Plan    Risperdal Po 1 mg BID  Therapeutic Milieu  DVT Prophylaxis Ambulation  Medications:    Current Facility-Administered Medications   Medication Dose Route Frequency   ??? risperiDONE (RisperDAL) tablet 1 mg  1 mg Oral BID   ??? citalopram (CELEXA) tablet 10 mg  10 mg Oral DAILY   ??? aluminum & magnesium hydroxide-simethicone (MYLANTA II) oral suspension 30 mL  30 mL Oral Q4H PRN   ??? hydrOXYzine pamoate (VISTARIL) capsule 50 mg  50 mg Oral QID PRN   ??? magnesium hydroxide (MILK OF MAGNESIA) concentrated oral suspension 5 mL  5 mL Oral Q6H PRN   ??? multivitamin (ONE A DAY) tablet 1 Tab  1 Tab Oral DAILY   ??? nicotine (NICODERM CQ) 21 mg/24 hr patch 1 Patch  1 Patch TransDERmal DAILY PRN   ??? levothyroxine (SYNTHROID) tablet 112 mcg  112 mcg Oral ACB   ??? albuterol (PROVENTIL HFA, VENTOLIN HFA, PROAIR HFA) inhaler 2 Puff  2 Puff Inhalation Q4H PRN   ??? acetaminophen (TYLENOL) tablet 650 mg  650 mg Oral Q6H PRN           Signed By: Conard Novak, MD

## 2016-09-23 NOTE — Other (Signed)
[facility]  Group Note  [address]  [place], [place]?? [address]  ????  ????  ????  Date of service: 09/23/16  ????  Start time: 10am  Stop time: 12pm  ????  Type of session: Recreational Activity Group  ????  Problem number: Recreation/Socialization  ????  Short term goal (STG): Pt will attend recreation/activity group and participate in artwork, video games, scrabble, chess and cornhole while socializing appropriately with AT and other pt's  ????  Intervention/techniques: AT encouraged participation in activities and appropriate socializing.   ????  Patient mental status/affect: Calm. Actively participating. Enjoying activity.  ????  Patient behavior/appearance: Calm and Neatly Groomed  ????  Special patient treatment accommodations provided (describe): None needed  ????  Patient response and progress towards goals: Pt is responding and progressing towards goal by attending group and actively participating in activity and appropriately socializing with AT and other pt's.  ????  ????  Lance B. Sydnor

## 2016-09-24 ENCOUNTER — Encounter: Attending: Neurology | Primary: Family Medicine

## 2016-09-24 MED ORDER — LORAZEPAM 2 MG TAB
2 mg | ORAL | Status: AC
Start: 2016-09-24 — End: 2016-09-24
  Administered 2016-09-24: 18:00:00 via ORAL

## 2016-09-24 MED FILL — CITALOPRAM 20 MG TAB: 20 mg | ORAL | Qty: 1

## 2016-09-24 MED FILL — ACETAMINOPHEN 325 MG TABLET: 325 mg | ORAL | Qty: 2

## 2016-09-24 MED FILL — LORAZEPAM 2 MG TAB: 2 mg | ORAL | Qty: 1

## 2016-09-24 MED FILL — NICOTINE 21 MG/24 HR DAILY PATCH: 21 mg/24 hr | TRANSDERMAL | Qty: 1

## 2016-09-24 MED FILL — HYDROXYZINE PAMOATE 50 MG CAP: 50 mg | ORAL | Qty: 1

## 2016-09-24 MED FILL — MULTIVITAMIN TAB: ORAL | Qty: 1

## 2016-09-24 MED FILL — LEVOTHYROXINE 112 MCG TAB: 112 mcg | ORAL | Qty: 1

## 2016-09-24 MED FILL — RISPERIDONE 1 MG TAB: 1 mg | ORAL | Qty: 1

## 2016-09-24 NOTE — Other (Signed)
[facility]  Group Note  [address]  [place], [place]?? [address]  ????  ????  ????  Date of service: 09/24/16  ????  Start time: 10am  Stop time: 12pm  ????  Type of session: Recreational Activity Group  ????  Problem number: Recreation/Music/Socialization  ????  Short term goal (STG): Pt will attend recreation/activity group and participate in artwork while listening to music and socializing appropriately with AT and other pt's  ????  Intervention/techniques: AT encouraged participation in activities and appropriate socializing.   ????  Patient mental status/affect: Calm. Actively participating. Enjoying activity.  ????  Patient behavior/appearance: Calm and Neatly Groomed  ????  Special patient treatment accommodations provided (describe): None needed  ????  Patient response and progress towards goals: Pt is responding and progressing towards goal by attending group and actively participating in activity and appropriately socializing with AT and other pt's.  ????  ????  Lance B. Sydnor

## 2016-09-24 NOTE — Behavioral Health Treatment Team (Signed)
BEHAVIORAL ASSESSMENT  Pt alert and oriented x 3.  Pt clean and well kempt; dressed appropriately for season.   Patient maintain good eye contact during assessment.  Patient up ad lib on unit; in tv lounge socializing with other patients.  Patient admits that she is still having thoughts of hurting herself.  Denies HI and VH this shift.  Patient stated that she is still hearing voices and that the voices make her want to harm herself.  Patient rates anxiety 6 out of 10; rates depression 6 out of 10.  Pt commits to safety on the unit.    SLEEP ASSESSMENT  Hours nightly slept: Pt asleep at 2245; continues to sleep at this time  Difficulty Falling Asleep: No  Difficulty Staying Asleep: No  Broken Sleep: No  Clinician's Observations: Resting quietly in bed with eyes closed.    APPETITE ASSESSMENT  Percentage of Patient's Food Consumption:  Pt consumed 100% of HS snack  Clinician's Observations:  Pt denies gastric distress or N/V this shift.     SUICIDE RE-ASSESSMENT  In the past 12 hours have you had thoughts of hurting yourself:  Yes  (If no when was the last time you have had thoughts of suicide) N/A  Explain the thoughts:   What plan does the patient have: No plan  On scale of 1-10 how does patient rate suicidal thoughts: 1    Nurses objective of patients reported suicidal thoughts:     On the nurses scale of 1-5 where does your patient rate 1= no suicidal ideations, 2= vague thoughts, 3= strong thoughts with no plan; 4= thoughts with plan low lethality high chance of rescue; 5= strong thoughts with plan and means to carry out plan high lethality low chance of rescue)    NURSING GROUP  TOPIC OF GROUP:  Spirituality/Music  Leader of Group: Chaplin Phil  START TIME:  1800  STOP TIME: 1900  Patient Participation: Attended  Describe Patient's involvement in group:  Active participation     INTERVENTIONS  Medication Orders/Changes by Physician this Shift: None     TREATMENT  PLAN PROBLEM LIST & CORRELATING GOALS/INTERVENTIONS      Problem #  Problem    #1 SI    #2 Depression   #3  Anxiety   #4  Tobacco Use             CLIENT RESPONSE TO TREATMENT  Patient Response to Medication Orders/Changes by Physician this Shift: Compliant    Describe Patient Response to Master Treatment Plan's Goals/Interventions:   Denies SI  Rates depression 6 out of 10  Rates anxiety 6 out of 10  Uses Nicoderm patch    Readiness to Change  Patient Motivation for Change: Motivated    PLAN  1:1 interaction and therapeutic listening provided by staff.  Current coping skills assessed.  Educated patient on coping strategies that could be utilized by patient to assist  her in coping with impulse control and/or harmful thoughts.  Educated and encouraged patient to comply with treatment plan.  Administered medications as prescribed.  Teaching done on all medications administered. Maintained patient safety this shift.  Continue with current plan of care.

## 2016-09-24 NOTE — Other (Signed)
Therapeutic Recreation Note: Pt declined scheduled 3pm recreation group opting to remain in bed??despite prompting from AT.

## 2016-09-24 NOTE — Other (Signed)
Suicide Risk Management Discussion/Safety Plan in family Conferences.      -Patient family Jeannetta Nap( Jim Bucklew [phone]) educated on the illness and suicidal prevention safety plan.     -Concerns were also raised about patient's safety at home and the need to remove any potential weapon from the home including but not limited to guns, knives, and medications locking them locked away so patient cannot access them to harm herself/himself    -Family Rosanne Ashing(Jim) was educated on the consequences of not eliminating access including the possibility that she could overdose on medications, use a gun to shoot herself/himself, or use a knife to cut herself.     -Patient's Family Rosanne Ashing(Jim) verbalized understanding and assured they will take out all potential weapons form patient access and lock it up and that they are willing to engage in family therapy with patient.     -Family was informed that patient is at long term chronic risk for suicide in view of his/her previous suicide attempts and deliberate self-harm. It was explained that he/she has a very low tolerance for distress and that the combination of this and noncompliance with psychotropic medication and treatment could spike patient???s suicide risk acutely.      -Specific triggers, patient identified drinking beer while taking Klonopin,  led to her hospitalization and the importance of patient compliance with medications, individual, and family therapy was discussed with family.     -Warning signs of suicidal was also discussed with family and they were provided with a crisis hotline number ([phone]) and instructions to take patient to the nearest ED if patient voiced any Suicidal thought or homicidal thought or recurrence of symptoms.     Patient has a positive social support system and is able to identify protective factors including supportive family, religion, and a desire to live.     We discussed patient's aftercare plans and patient's family was informed  of patient???s scheduled appointments at [facility], with Dr. Welton FlakesKhan, Family voiced their support for him.

## 2016-09-24 NOTE — Behavioral Health Treatment Team (Signed)
Psychiatry Progress Note    Date: 09/24/2016  Account Number:  [id number]  Name: Amber [name]      Subjective:     Patient seen. She I feeling better. She reports seeing shadows. She denies hearing voices, She has mood swing racing thought. Irritability. She denies suicidal thought. She denies homicidal thought.     Patient Active Problem List    Diagnosis Date Noted   ??? Suicidal behavior 07/25/2016   ??? Suicidal ideations 07/25/2016   ??? Bipolar disorder (HCC) 07/25/2016   ??? Bipolar 1 disorder, mixed (HCC) 12/30/2014   ??? Suicidal ideation 12/29/2014   ??? Seizures (HCC) 07/20/2014   ??? HTN (hypertension) 07/20/2014   ??? GERD (gastroesophageal reflux disease) 07/20/2014   ??? Tobacco abuse counseling 05/18/2014   ??? Hyperlipidemia 05/18/2014   ??? Lumbar pain with radiation down left leg 05/18/2014   ??? COPD (chronic obstructive pulmonary disease) (HCC) 03/24/2014   ??? Hypothyroidism 03/24/2014   ??? Tobacco abuse 03/24/2014   ??? Homicidal ideation 08/06/2013   ??? Hypokalemia 03/24/2013   ??? Fracture of ankle, bimalleolar, left, closed 03/23/2013   ??? Essential hypertension, benign 03/23/2013   ??? Anxiety state 03/23/2013     Class: Acute     Past Surgical History:   Procedure Laterality Date   ??? DELIVERY C-SECTION     ??? HX CHOLECYSTECTOMY      lap chole   ??? HX GI      colonoscopy, EGD   ??? HX ORTHOPAEDIC      trimalleolar fx repair 03/23/13   ??? HX OTHER SURGICAL      thyroidectomy april 2012   ??? HX OTHER SURGICAL      C/section with stillborn - under general anesthesia   ??? HX TUBAL LIGATION      lap hysterectomy      Allergies   Allergen Reactions   ??? Topamax [Topiramate] Anaphylaxis   ??? Ultram [Tramadol] Itching   ??? Nasal Spray [Sodium Chloride] Other (comments)     Migraine nasal spray makes her throat bleed   ??? Aspirin Other (comments)     Swelling "knot on side of neck"   ??? Ciprofloxacin Other (comments)     Causes facial redness     ??? Norvasc [Amlodipine] Hives   ??? Tamsulosin Swelling    ??? Levsin [Hyoscyamine Sulfate] Other (comments)     Blurred vision     ??? Pcn [Penicillins] Nausea and Vomiting      Social History   Substance Use Topics   ??? Smoking status: Current Every Day Smoker     Packs/day: 2.00     Years: 27.00   ??? Smokeless tobacco: Never Used   ??? Alcohol use 0.0 oz/week     0 Standard drinks or equivalent per week      Comment: rare      Family History   Problem Relation Age of Onset   ??? Arthritis-osteo Mother    ??? Cancer Mother    ??? Migraines Mother    ??? Headache Mother    ??? Heart Disease Mother    ??? Heart Disease Father    ??? Asthma Sister    ??? Lung Disease Brother    ??? Arthritis-osteo Maternal Grandmother    ??? Cancer Maternal Grandmother    ??? Diabetes Maternal Grandmother    ??? Hypertension Maternal Grandmother    ??? Stroke Maternal Grandmother    ??? Breast Cancer Maternal Grandmother    ??? Hypertension Maternal Grandfather    ???  Alcohol abuse Neg Hx    ??? Bleeding Prob Neg Hx    ??? Elevated Lipids Neg Hx    ??? Psychiatric Disorder Neg Hx    ??? Mental Retardation Neg Hx       Lori D McCoy, DO        Objective:         Patient Vitals for the past 8 hrs:   BP Temp Pulse Resp SpO2 Weight   09/24/16 1436 101/69 98.1 ??F (36.7 ??C) 66 18 100 % -   09/24/16 0917 - - - - - 54.3 kg (119 lb 9.6 oz)         Mental Status exam:     Pt appears stated age, kempt and casually dressed. Fair eye contact.  Psychomotor neutral. AAO X 3. Fair memory, concentration & attention span  Speech is articulated, normal rate, volume and quantity  Mood is anxious and affect is appropriate  TP is goal directed. TC No delusion, No suicidal/homicidal ideation, intent or plan  Perception, She has VH of shadows. Average intelligence and fund of knowledge  Impulse control, Insight and judgement is limited        Therapy notes nursing notes and labs in the past 24 hours reviewed and appreciated    Assessment/Plan:   Principal Problem:    Bipolar 1 disorder, mixed (HCC) (12/30/2014)    Active Problems:     COPD (chronic obstructive pulmonary disease) (HCC) (03/24/2014)      Tobacco abuse (03/24/2014)      Suicidal behavior (07/25/2016)          Assessment      Changes In Treatment Plan, Potential Side Effect and Benefit and alternatives discussed.The following information was reviewed and discussed:   the risks and benefits of the proposed medication   patient given opportunity to ask questions   off label use of an approved drug/prescription discussed with patient     Treatment Plan    Risperdal Po 1 mg BID  Therapeutic Milieu  DVT Prophylaxis Ambulation  Anticipate DC in 24 hours  Medications:    Current Facility-Administered Medications   Medication Dose Route Frequency   ??? risperiDONE (RisperDAL) tablet 1 mg  1 mg Oral BID   ??? citalopram (CELEXA) tablet 10 mg  10 mg Oral DAILY   ??? aluminum & magnesium hydroxide-simethicone (MYLANTA II) oral suspension 30 mL  30 mL Oral Q4H PRN   ??? hydrOXYzine pamoate (VISTARIL) capsule 50 mg  50 mg Oral QID PRN   ??? magnesium hydroxide (MILK OF MAGNESIA) concentrated oral suspension 5 mL  5 mL Oral Q6H PRN   ??? multivitamin (ONE A DAY) tablet 1 Tab  1 Tab Oral DAILY   ??? nicotine (NICODERM CQ) 21 mg/24 hr patch 1 Patch  1 Patch TransDERmal DAILY PRN   ??? levothyroxine (SYNTHROID) tablet 112 mcg  112 mcg Oral ACB   ??? albuterol (PROVENTIL HFA, VENTOLIN HFA, PROAIR HFA) inhaler 2 Puff  2 Puff Inhalation Q4H PRN   ??? acetaminophen (TYLENOL) tablet 650 mg  650 mg Oral Q6H PRN           Signed By: Conard NovakAyobola A Marlaya Turck, MD

## 2016-09-24 NOTE — Other (Signed)
Safety Plan    Warning signs that I may be in crisis:      1. Become angry    2. nervous    3. cry      Things I can do on my own to feel better:    1. shop    2. Take a drive    3. Go on a picnic    People I can call for help:    1. Hosptial    2. FriendFrench Ana- Tracy    3. Husband- Rosanne AshingJim    Professionals I can call for help:    1. My case manager/therapist: [facility]    2. Crisis hotline: 425-717-75365796772489    3. Call 911 or go to the nearest emergency room    Ways to keep my environment safe when I am in crisis    1. Lock up all medications in a safe and have someone give them to me    2. Lock up all guns and other weapons and sharps (razors, Dillard'skitchen knives, etc.)    3. Have a family member or friend stay with me for support and supervision

## 2016-09-25 MED ORDER — RISPERIDONE 1 MG TAB
1 mg | ORAL_TABLET | Freq: Two times a day (BID) | ORAL | 0 refills | Status: AC
Start: 2016-09-25 — End: 2016-10-09

## 2016-09-25 MED ORDER — CITALOPRAM 20 MG TAB
20 mg | ORAL_TABLET | Freq: Every day | ORAL | 0 refills | Status: AC
Start: 2016-09-25 — End: 2016-10-09

## 2016-09-25 MED ORDER — MULTIVITAMIN TAB
ORAL_TABLET | Freq: Every day | ORAL | 0 refills | Status: AC
Start: 2016-09-25 — End: 2016-10-09

## 2016-09-25 MED FILL — ACETAMINOPHEN 325 MG TABLET: 325 mg | ORAL | Qty: 2

## 2016-09-25 MED FILL — CITALOPRAM 20 MG TAB: 20 mg | ORAL | Qty: 1

## 2016-09-25 MED FILL — HYDROXYZINE PAMOATE 50 MG CAP: 50 mg | ORAL | Qty: 1

## 2016-09-25 MED FILL — MULTIVITAMIN TAB: ORAL | Qty: 1

## 2016-09-25 MED FILL — RISPERIDONE 1 MG TAB: 1 mg | ORAL | Qty: 1

## 2016-09-25 MED FILL — LEVOTHYROXINE 112 MCG TAB: 112 mcg | ORAL | Qty: 1

## 2016-09-25 NOTE — Discharge Summary (Signed)
I spent over 50 minutes on coordination for care for this patient discharge and preparation of this discharge summary dictated [id number]

## 2016-09-25 NOTE — Behavioral Health Treatment Team (Signed)
BEHAVIORAL ASSESSMENT  Pt alert and oriented x 4.  Pt clean and well kempt; dressed appropriately for season.   Patient maintains poor eye contact during assessment.  Patient stayed in room a lot this shift; depressed regarding not getting to go home today.  Denies SI/HI or A/V/H this shift.  Patient rates anxiety 8 out of 10; rates depression 6 out of 10.  Pt commits to safety on the unit.    SLEEP ASSESSMENT  Hours nightly slept: Pt asleep at 2300  Difficulty Falling Asleep: No  Difficulty Staying Asleep: No  Broken Sleep: No  Clinician's Observations: Resting quietly in bed with eyes closed.    APPETITE ASSESSMENT  Percentage of Patient's Food Consumption:  Pt consumed 100% of HS snack  Clinician's Observations:  Pt denies gastric distress or N/V this shift.     SUICIDE RE-ASSESSMENT  In the past 12 hours have you had thoughts of hurting yourself:  No  (If no when was the last time you have had thoughts of suicide) N/A  Explain the thoughts:  What plan does the patient have: None  On scale of 1-10 how does patient rate suicidal thoughts: 1    Nurses objective of patients reported suicidal thoughts:     On the nurses scale of 1-5 where does your patient rate 1= no suicidal ideations, 2= vague thoughts, 3= strong thoughts with no plan; 4= thoughts with plan low lethality high chance of rescue; 5= strong thoughts with plan and means to carry out plan high lethality low chance of rescue)    NURSING GROUP  TOPIC OF GROUP:  Coping Skills  Leader of Group: International aid/development workerLisa RN  START TIME:  1930  STOP TIME: 2000  Patient Participation: Refused  Describe Patient's involvement in group:  Refused     INTERVENTIONS  Medication Orders/Changes by Physician this Shift: None    TREATMENT  PLAN PROBLEM LIST & CORRELATING GOALS/INTERVENTIONS      Problem #  Problem    #1 SI    #2 Depression   #3 Anxiety    #4 Tobacco Use              CLIENT RESPONSE TO TREATMENT  Patient Response to Medication Orders/Changes by Physician this Shift:  Compliant    Describe Patient Response to Master Treatment Plan's Goals/Interventions:   Denies SI  Rates depression 6 out of 10  Rates anxiety 8 out of 10  Nicoderm patch in use    Readiness to Change  Patient Motivation for Change: Motivated    PLAN  1:1 interaction and therapeutic listening provided by staff.  Current coping skills assessed.  Educated patient on coping strategies that could be utilized by patient to assist her in coping with impulse control and/or harmful thoughts.  Educated and encouraged patient to comply with treatment plan.  Administered medications as prescribed.  Teaching done on all medications administered. Maintained patient safety this shift.  Continue with current plan of care.
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
## 2016-09-25 NOTE — Discharge Summary (Signed)
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  She was admitted because of suicidal  thoughts, depressed mood and seeing shadows.     HISTORY AND PHYSICAL:  Please refer to dictated History & Physical on electronic medical  record performed by this writer for details of Chief Complaint,  History of Present Illness, Past Psychiatric History, Past Medical  History, Personal and Social History and Mental Status Examination  on admission.    DSM DIAGNOSIS:  Please refer to electronic medical record for details of this.     HOSPITAL COURSE:   The patient was admitted to inpatient Behavioral Health Unit,  managed as a case of unspecified bipolar disorder and other related  disorder, possible bipolar I disorder most recent episode mixed with  psychotic features, and cannabis use disorder severe.   Patient was  placed on standard monitoring and standard precautions, placed on  standard p.r.n. orders.  Patient received group therapy and  participated in group and unit activities.  Patient received  multidisciplinary team care including therapy in the milieu.  Patient was discussed regularly in staffing during the week.  The  patient'[name] medication was verified from the pharmacy and the patient  was placed back on her medication.  It was noted that patient was  starting to feel better, shadows resolved, voices resolved, and her  mood became stable.  She adamantly denied thoughts, she denied  homicidal thoughts after she was started on Risperdal 1 mg p.o.  b.i.d. and Celexa 10 mg p.o. daily.  She started feeling hopeful for   the future reporting that she was feeling better.  She denied side  effects of the medication. He had made the maximum benefit of acute  inpatient behavioral health care.  At the time of discharge the  patient was not psychotic, she denied depression and her mood was  stable. She adamantly denied suicidal thoughts, she denied homicidal  thoughts and she was capable of making her own financial and medical  decisions.  She verbalized adequate understanding of treatment team  recommendations, medication and aftercare plan.  The patient was  subsequently discharged in good condition as she has made the  maximum benefit of acute inpatient behavioral care.      MENTAL STATUS EVALUATION ON DISCHARGE:  A young woman looking stated age, dressed appropriate to weather  with fair grooming and hygiene.  She is calm, cooperative and  appropriate in behavior.  Speech is spontaneous, clear and coherent.  Her mood is stable.  Her thoughts are logical mostly and goal  directed.  She adamantly denies suicidal thoughts.  She denies  homicidal thoughts.  She denies hallucinations.  She denies  delusions.  She denies any paranoid or persecutory ideation.  She is  alert, awake, oriented to person, place and person.  She has good  attention span.  She has fairly good insight and judgment.      DSM DIAGNOSIS ON DISCHARGE:  AXIS I:   Unspecified bipolar disorder  and other related disorders.  Possibly bipolar I disorder most recent episode mixed with psychotic  features.  Cannabis use disorder severe, rule out opiate use  disorder.    AXIS II: Cluster B trait.  Possibly borderline personality disorder.  AXIS III: History of hypertension and angina.  AXIS IV:  Severe persistent mental illness, history of noncompliance  with medication, poor coping skills, poor problem solving skills,  marital problems, chemical dependency.   AXIS V:  GAF of 60 to 65.     DISCHARGE INSTRUCTIONS:   1. Discharge diet is cardiac.    2. Discharge activity as tolerated.   3. Discharge follow-up-for psychiatry the patient will follow-up  with [facility].  For medical the patient will follow-up with primary  care physician.  The patient is to call 911 or go to the Emergency  Room for any recurrence of symptoms or if suicidal or homicidal.  The patient verbalized adequate understanding. All precautions were  discontinued on discharge.    DISCHARGE MEDICATIONS:   1. Celexa 20 mg p.o. daily.   2. Multivitamin 1 tablet p.o. daily.   3. Risperdal 1 mg p.o. b.i.d.     All precautions were discontinued on discharge.  The patient is to  resume Synthroid upon discharge.          AO:sp  DD: 10/06/2016 16:07:32  DT: 10/10/2016 12:50:16  Job ID:  [id number]  CC:

## 2016-09-25 NOTE — Discharge Summary (Signed)
I spent over 50 minutes on coordination for care for this patient discharge and preparation of this discharge summary dictated [id number]

## 2016-09-25 NOTE — Behavioral Health Treatment Team (Signed)
Orders received to discharge patient to home per Dr. Daria Pasturesloworaran, MD. Discharge instructions, healthy lifestyle, medications and follow up appointments reviewed with patient.  Patient verbalized understanding.  Pt will discharge to home with follow [place] at [facility], [place], [place] and Dr. Chales SalmonLori McCoy. Appointment dates and times reviewed, patient verbalized understanding. AVS was sent to next level of care at [facility], [place], [place] and Dr. Chales SalmonLori McCoy. Prescriptions given to patient. All personal belongings obtained and verified with patient, forms signed.  Home medications obtained from pharmacy, and verified with patient. No items in safe. Patient discharged via front hallway in stable condition with all belongings. Patient left building with husband.

## 2016-10-06 NOTE — Discharge Summary (Signed)
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  She was admitted because of suicidal  thoughts, depressed mood and seeing shadows.     HISTORY AND PHYSICAL:  Please refer to dictated History & Physical on electronic medical  record performed by this writer for details of Chief Complaint,  History of Present Illness, Past Psychiatric History, Past Medical  History, Personal and Social History and Mental Status Examination  on admission.    DSM DIAGNOSIS:  Please refer to electronic medical record for details of this.     HOSPITAL COURSE:   The patient was admitted to inpatient Behavioral Health Unit,  managed as a case of unspecified bipolar disorder and other related  disorder, possible bipolar I disorder most recent episode mixed with  psychotic features, and cannabis use disorder severe.   Patient was  placed on standard monitoring and standard precautions, placed on  standard p.r.n. orders.  Patient received group therapy and  participated in group and unit activities.  Patient received  multidisciplinary team care including therapy in the milieu.  Patient was discussed regularly in staffing during the week.  The  patient's medication was verified from the pharmacy and the patient  was placed back on her medication.  It was noted that patient was  starting to feel better, shadows resolved, voices resolved, and her  mood became stable.  She adamantly denied thoughts, she denied  homicidal thoughts after she was started on Risperdal 1 mg p.o.  b.i.d. and Celexa 10 mg p.o. daily.  She started feeling hopeful for  the  future reporting that she was feeling better.  She denied side  effects of the medication. He had made the maximum benefit of acute  inpatient behavioral health care.  At the time of discharge the  patient was not psychotic, she denied depression and her mood was  stable. She adamantly denied suicidal thoughts, she denied homicidal  thoughts and she was capable of making her own financial and medical  decisions.  She verbalized adequate understanding of treatment team  recommendations, medication and aftercare plan.  The patient was  subsequently discharged in good condition as she has made the  maximum benefit of acute inpatient behavioral care.      MENTAL STATUS EVALUATION ON DISCHARGE:  A young woman looking stated age, dressed appropriate to weather  with fair grooming and hygiene.  She is calm, cooperative and  appropriate in behavior.  Speech is spontaneous, clear and coherent.  Her mood is stable.  Her thoughts are logical mostly and goal  directed.  She adamantly denies suicidal thoughts.  She denies  homicidal thoughts.  She denies hallucinations.  She denies  delusions.  She denies any paranoid or persecutory ideation.  She is  alert, awake, oriented to person, place and person.  She has good  attention span.  She has fairly good insight and judgment.      DSM DIAGNOSIS ON DISCHARGE:  AXIS I:   Unspecified bipolar disorder  and other related disorders.  Possibly bipolar I disorder most recent episode mixed with psychotic  features.  Cannabis use disorder severe, rule out opiate use  disorder.    AXIS II: Cluster B trait.  Possibly borderline personality disorder.  AXIS III: History of hypertension and angina.  AXIS IV:  Severe persistent mental illness, history of noncompliance  with medication, poor coping skills, poor problem solving skills,  marital problems, chemical dependency.   AXIS V:  GAF of 60 to 65.     DISCHARGE INSTRUCTIONS:   1. Discharge diet is cardiac.   2. Discharge activity as tolerated.    3. Discharge follow-up-for psychiatry the patient will follow-up  with [facility].  For medical the patient will follow-up with primary  care physician.  The patient is to call 911 or go to the Emergency  Room for any recurrence of symptoms or if suicidal or homicidal.  The patient verbalized adequate understanding. All precautions were  discontinued on discharge.    DISCHARGE MEDICATIONS:   1. Celexa 20 mg p.o. daily.   2. Multivitamin 1 tablet p.o. daily.   3. Risperdal 1 mg p.o. b.i.d.     All precautions were discontinued on discharge.  The patient is to  resume Synthroid upon discharge.          AO:sp  DD: 10/06/2016 16:07:32  DT: 10/10/2016 12:50:16  Job ID:  [id number]  CC:

## 2016-12-10 ENCOUNTER — Ambulatory Visit
Admit: 2016-12-10 | Discharge: 2016-12-10 | Payer: PRIVATE HEALTH INSURANCE | Attending: Neurology | Primary: Family Medicine

## 2016-12-10 DIAGNOSIS — R251 Tremor, unspecified: Secondary | ICD-10-CM

## 2016-12-10 MED ORDER — IBUPROFEN 800 MG TAB
800 mg | ORAL_TABLET | Freq: Three times a day (TID) | ORAL | 0 refills | Status: DC | PRN
Start: 2016-12-10 — End: 2017-06-10

## 2016-12-10 MED ORDER — RANITIDINE 150 MG TAB
150 mg | ORAL_TABLET | Freq: Two times a day (BID) | ORAL | 3 refills | Status: DC
Start: 2016-12-10 — End: 2017-10-24

## 2016-12-10 MED ORDER — ESCITALOPRAM 10 MG TAB
10 mg | ORAL_TABLET | Freq: Every day | ORAL | 3 refills | Status: DC
Start: 2016-12-10 — End: 2017-06-10

## 2016-12-10 MED ORDER — CLONAZEPAM 1 MG TAB
1 mg | ORAL_TABLET | ORAL | 3 refills | Status: DC | PRN
Start: 2016-12-10 — End: 2017-06-10
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
## 2016-12-10 NOTE — Patient Instructions (Signed)
[facility]  Office working hours are Monday - Thursday 9:00 am to 5:00 pm.    Office phone number is [phone] and fax is [phone].  For non-emergent medical care and clinical advice during office hours:   1. Call office or   2.  Send message or request using MyChart  For non-emergent medical care and clinical advice after office hours:  1.  Call [phone] or  2.  Send message or request using MyChart  Emergency care can be obtained at the [facility], Urgent Care or calling 911.    Patient Satisfaction Survey  We appreciate you giving us your e-mail address.  Please watch for our patient satisfaction survey which you will receive by e-mail.  We strive to provide you with the best care possible.  We respect all comments and will take comments into consideration to improve our service.  Thank you for your participation.    /As a valued patient, you will be receiving a survey from [employer].  We encourage you to share your thoughts and opinions about the care you received today.  Thank you for choosing [facility].

## 2017-06-10 ENCOUNTER — Ambulatory Visit
Admit: 2017-06-10 | Discharge: 2017-06-10 | Payer: PRIVATE HEALTH INSURANCE | Attending: Neurology | Primary: Family Medicine

## 2017-06-10 DIAGNOSIS — F419 Anxiety disorder, unspecified: Secondary | ICD-10-CM

## 2017-06-10 MED ORDER — IBUPROFEN 600 MG TAB
600 mg | ORAL_TABLET | Freq: Three times a day (TID) | ORAL | 0 refills | Status: DC | PRN
Start: 2017-06-10 — End: 2017-09-17

## 2017-06-10 MED ORDER — ESCITALOPRAM 20 MG TAB
20 mg | ORAL_TABLET | Freq: Every day | ORAL | 3 refills | Status: DC
Start: 2017-06-10 — End: 2017-08-19

## 2017-06-10 MED ORDER — CLONAZEPAM 1 MG TAB
1 mg | ORAL_TABLET | ORAL | 0 refills | Status: DC | PRN
Start: 2017-06-10 — End: 2017-06-17

## 2017-06-10 MED ORDER — IBUPROFEN 800 MG TAB
800 mg | ORAL_TABLET | Freq: Three times a day (TID) | ORAL | 0 refills | Status: DC | PRN
Start: 2017-06-10 — End: 2017-06-10
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
## 2017-06-10 NOTE — Patient Instructions (Signed)
[facility]  Office working hours are Monday - Thursday 9:00 am to 5:00 pm.    Office phone number is [phone] and fax is [phone].  For non-emergent medical care and clinical advice during office hours:   1. Call office or   2.  Send message or request using MyChart  For non-emergent medical care and clinical advice after office hours:  1.  Call [phone] or  2.  Send message or request using MyChart  Emergency care can be obtained at the [facility], Urgent Care or calling 911.    Patient Satisfaction Survey  We appreciate you giving us your e-mail address.  Please watch for our patient satisfaction survey which you will receive by e-mail.  We strive to provide you with the best care possible.  We respect all comments and will take comments into consideration to improve our service.  Thank you for your participation.    Pg

## 2017-06-17 MED ORDER — CLONAZEPAM 1 MG TAB
1 mg | ORAL_TABLET | ORAL | 0 refills | Status: DC | PRN
Start: 2017-06-17 — End: 2017-07-16

## 2017-06-17 NOTE — Addendum Note (Signed)
Addended by: Arbutus LeasOLUMBRO, Tiaira Arambula on: 06/17/2017 02:56 PM      Modules accepted: Orders

## 2017-07-16 ENCOUNTER — Encounter

## 2017-07-16 MED ORDER — CLONAZEPAM 1 MG TAB
1 mg | ORAL_TABLET | ORAL | 0 refills | Status: DC | PRN
Start: 2017-07-16 — End: 2017-08-19

## 2017-07-16 NOTE — Telephone Encounter (Signed)
[facility] [place]

## 2017-08-19 ENCOUNTER — Encounter

## 2017-08-19 MED ORDER — ESCITALOPRAM 20 MG TAB
20 mg | ORAL_TABLET | Freq: Every day | ORAL | 1 refills | Status: DC
Start: 2017-08-19 — End: 2018-02-19

## 2017-08-19 MED ORDER — CLONAZEPAM 1 MG TAB
1 mg | ORAL_TABLET | ORAL | 0 refills | Status: DC | PRN
Start: 2017-08-19 — End: 2017-09-17

## 2017-08-19 NOTE — Telephone Encounter (Signed)
Patient out of medication.  Has appointment next week w/ Dr. Kerin SalenBoughaba

## 2017-09-10 ENCOUNTER — Encounter: Attending: Neurology | Primary: Family Medicine

## 2017-09-10 ENCOUNTER — Encounter: Attending: Registered Nurse | Primary: Family Medicine

## 2017-09-17 ENCOUNTER — Encounter

## 2017-09-17 NOTE — Telephone Encounter (Signed)
Requested Prescriptions     Pending Prescriptions Disp Refills   ??? ibuprofen (MOTRIN) 600 mg tablet 90 Tab 0     Sig: Take 1 Tab by mouth every eight (8) hours as needed for Pain.   ??? clonazePAM (KLONOPIN) 1 mg tablet 30 Tab 0     Sig: Take 1 Tab by mouth as needed.

## 2017-09-19 ENCOUNTER — Encounter

## 2017-09-20 ENCOUNTER — Encounter

## 2017-09-20 MED ORDER — CLONAZEPAM 1 MG TAB
1 mg | ORAL_TABLET | ORAL | 0 refills | Status: DC
Start: 2017-09-20 — End: 2017-09-20

## 2017-09-20 MED ORDER — CLONAZEPAM 1 MG TAB
1 mg | ORAL_TABLET | ORAL | 0 refills | Status: DC | PRN
Start: 2017-09-20 — End: 2017-09-20

## 2017-09-20 MED ORDER — IBUPROFEN 600 MG TAB
600 mg | ORAL_TABLET | Freq: Three times a day (TID) | ORAL | 0 refills | Status: DC | PRN
Start: 2017-09-20 — End: 2017-10-24

## 2017-09-20 MED ORDER — CLONAZEPAM 1 MG TAB
1 mg | ORAL_TABLET | ORAL | 0 refills | Status: DC | PRN
Start: 2017-09-20 — End: 2017-10-24

## 2017-10-24 ENCOUNTER — Ambulatory Visit
Admit: 2017-10-24 | Discharge: 2017-10-24 | Payer: PRIVATE HEALTH INSURANCE | Attending: Neurology | Primary: Family Medicine

## 2017-10-24 DIAGNOSIS — R251 Tremor, unspecified: Secondary | ICD-10-CM

## 2017-10-24 MED ORDER — IBUPROFEN 600 MG TAB
600 mg | ORAL_TABLET | Freq: Three times a day (TID) | ORAL | 0 refills | Status: DC | PRN
Start: 2017-10-24 — End: 2018-02-11

## 2017-10-24 MED ORDER — CLONAZEPAM 1 MG TAB
1 mg | ORAL_TABLET | ORAL | 0 refills | Status: DC | PRN
Start: 2017-10-24 — End: 2018-02-11
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
## 2017-10-24 NOTE — Patient Instructions (Signed)
[facility]  Office working hours are Monday - Thursday 9:00 am to 5:00 pm.    Office phone number is [phone] and fax is [phone].  For non-emergent medical care and clinical advice during office hours:   1. Call office or   2.  Send message or request using MyChart  For non-emergent medical care and clinical advice after office hours:  1.  Call [phone] or  2.  Send message or request using MyChart  Emergency care can be obtained at the [facility] ER, Urgent Care or calling 911.    Patient Satisfaction Survey  We appreciate you giving us your e-mail address.  Please watch for our patient satisfaction survey which you will receive by e-mail.  We strive to provide you with the best care possible.  We respect all comments and will take comments into consideration to improve our service.  Thank you for your participation.    As a valued patient, you will be receiving a survey from [employer].  We encourage you to share your thoughts and opinions about the care you received today.  Thank you for choosing [facility].

## 2018-02-11 ENCOUNTER — Encounter

## 2018-02-11 NOTE — Telephone Encounter (Signed)
Requested Prescriptions     Pending Prescriptions Disp Refills   ??? ibuprofen (MOTRIN) 600 mg tablet 80 Tab 0     Sig: Take 1 Tab by mouth every eight (8) hours as needed for Pain.   ??? clonazePAM (KLONOPIN) 1 mg tablet 30 Tab 0     Sig: Take 1 Tab by mouth as needed.

## 2018-02-12 MED ORDER — CLONAZEPAM 1 MG TAB
1 mg | ORAL_TABLET | ORAL | 0 refills | Status: DC | PRN
Start: 2018-02-12 — End: 2018-02-19

## 2018-02-12 MED ORDER — IBUPROFEN 600 MG TAB
600 mg | ORAL_TABLET | Freq: Three times a day (TID) | ORAL | 0 refills | Status: DC | PRN
Start: 2018-02-12 — End: 2018-04-13

## 2018-02-12 NOTE — Telephone Encounter (Signed)
Is out of meds.  Please fill 02/12/18.  Out of meds, called yesterday

## 2018-02-19 ENCOUNTER — Ambulatory Visit
Admit: 2018-02-19 | Discharge: 2018-02-19 | Payer: PRIVATE HEALTH INSURANCE | Attending: Neurology | Primary: Family Medicine

## 2018-02-19 DIAGNOSIS — F445 Conversion disorder with seizures or convulsions: Secondary | ICD-10-CM

## 2018-02-19 MED ORDER — GABAPENTIN 100 MG CAP
100 mg | ORAL_CAPSULE | Freq: Three times a day (TID) | ORAL | 0 refills | Status: DC
Start: 2018-02-19 — End: 2018-03-16

## 2018-02-19 MED ORDER — CLONAZEPAM 1 MG TAB
1 mg | ORAL_TABLET | ORAL | 0 refills | Status: DC | PRN
Start: 2018-02-19 — End: 2018-03-16
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
## 2018-03-16 ENCOUNTER — Telehealth

## 2018-03-16 MED ORDER — GABAPENTIN 100 MG CAP
100 mg | ORAL_CAPSULE | Freq: Three times a day (TID) | ORAL | 0 refills | Status: DC
Start: 2018-03-16 — End: 2018-04-13

## 2018-03-16 MED ORDER — CLONAZEPAM 1 MG TAB
1 mg | ORAL_TABLET | ORAL | 0 refills | Status: DC | PRN
Start: 2018-03-16 — End: 2018-04-13

## 2018-03-16 NOTE — Telephone Encounter (Signed)
Received a fax from [facility] stating they needed Clarification on patient's Gabapentin 100 Mg Caps. Take 1 cap by mouth 3 times daily Disp # 90 no refills and her Klonopin 1 Mg Tabs Take 1 tab by mouth every 8-12 hrs as needed. Disp # 30 with no refills. So [place] [facility] [place] spoke to [place] and gave him a verbal for both of these medication and [facility] and he voiced his understanding.

## 2018-04-13 ENCOUNTER — Encounter

## 2018-04-13 MED ORDER — GABAPENTIN 100 MG CAP
100 mg | ORAL_CAPSULE | Freq: Three times a day (TID) | ORAL | 2 refills | Status: AC
Start: 2018-04-13 — End: 2018-05-13

## 2018-04-13 MED ORDER — IBUPROFEN 600 MG TAB
600 mg | ORAL_TABLET | Freq: Three times a day (TID) | ORAL | 0 refills | Status: DC | PRN
Start: 2018-04-13 — End: 2018-06-11

## 2018-04-13 MED ORDER — CLONAZEPAM 1 MG TAB
1 mg | ORAL_TABLET | ORAL | 0 refills | Status: DC | PRN
Start: 2018-04-13 — End: 2018-05-14

## 2018-04-13 NOTE — Telephone Encounter (Signed)
Requested Prescriptions     Pending Prescriptions Disp Refills   ??? clonazePAM (KLONOPIN) 1 mg tablet 30 Tab 0     Sig: Take 1 Tab by mouth every eight to twelve (8-12) hours as needed (as needed). Max Daily Amount: 3 mg. Indications: Essential Tremor, panic disorder   ??? ibuprofen (MOTRIN) 600 mg tablet 30 Tab 0     Sig: Take 1 Tab by mouth every eight (8) hours as needed for Pain. Indications: head pain, Joint Damage causing Pain and Loss of Function   ??? gabapentin (NEURONTIN) 100 mg capsule 90 Cap 0     Sig: Take 1 Cap by mouth three (3) times daily for 30 days. Max Daily Amount: 300 mg.

## 2018-04-13 NOTE — Telephone Encounter (Signed)
Patient called requesting refills on her Klonopin 1 Mg Tabs. Take 1 tab by mouth every 8-12 hrs as needed Disp # 30 with 0 refills and her Gabapentin 100 Mg Caps. Take 1 cap by mouth 3 times daily. Disp # 90 with 2 refills. I verbally [place] these [place] [facility] [place] and left them on the provider line and I E-Scribed the Motrin 600 Mg Tabs she also requested Take 1 tab by mouth every 8 hrs as needed Disp # 30 with 0 refills. Patient has a follow up scheduled in June with Dr. Minus Liberty but I had to put the medication under [facility] because she has [employer] insurance.

## 2018-05-14 ENCOUNTER — Encounter

## 2018-05-14 MED ORDER — CLONAZEPAM 1 MG TAB
1 mg | ORAL_TABLET | ORAL | 0 refills | Status: DC | PRN
Start: 2018-05-14 — End: 2018-06-11

## 2018-06-11 ENCOUNTER — Ambulatory Visit: Attending: Neurology

## 2018-06-11 ENCOUNTER — Encounter: Attending: Neurology | Primary: Family Medicine

## 2018-06-11 ENCOUNTER — Ambulatory Visit
Admit: 2018-06-11 | Discharge: 2018-06-11 | Payer: PRIVATE HEALTH INSURANCE | Attending: Neurology | Primary: Family Medicine

## 2018-06-11 DIAGNOSIS — R200 Anesthesia of skin: Secondary | ICD-10-CM

## 2018-06-11 MED ORDER — IBUPROFEN 600 MG TAB
600 mg | ORAL_TABLET | Freq: Three times a day (TID) | ORAL | 0 refills | Status: DC | PRN
Start: 2018-06-11 — End: 2018-09-10

## 2018-06-11 MED ORDER — CLONAZEPAM 1 MG TAB
1 mg | ORAL_TABLET | ORAL | 0 refills | Status: DC | PRN
Start: 2018-06-11 — End: 2018-07-13
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
## 2018-06-30 ENCOUNTER — Inpatient Hospital Stay: Admit: 2018-06-30 | Payer: BLUE CROSS/BLUE SHIELD | Primary: Family Medicine

## 2018-06-30 DIAGNOSIS — R200 Anesthesia of skin: Secondary | ICD-10-CM

## 2018-06-30 MED ORDER — GADOTERATE MEGLUMINE 0.5 MMOL/ML IV SOLUTION
0.5 mmol/mL (376.9 mg/mL) | Freq: Once | INTRAVENOUS | Status: AC
Start: 2018-06-30 — End: 2018-06-30
  Administered 2018-06-30: 18:00:00 via INTRAVENOUS

## 2018-06-30 MED FILL — DOTAREM 0.5 MMOL/ML (376.9 MG/ML) INTRAVENOUS SOLUTION: 0.5 mmol/mL (376.9 mg/mL) | INTRAVENOUS | Qty: 15

## 2018-07-13 ENCOUNTER — Encounter

## 2018-07-13 NOTE — Telephone Encounter (Signed)
Last seen June 27th and Next appt Sept 26th

## 2018-07-14 MED ORDER — CLONAZEPAM 1 MG TAB
1 mg | ORAL_TABLET | ORAL | 0 refills | Status: DC | PRN
Start: 2018-07-14 — End: 2018-08-13

## 2018-07-14 NOTE — Progress Notes (Signed)
[facility] in [facility] [place], one month with 0 refills

## 2018-07-14 NOTE — Progress Notes (Signed)
Called in Kl[facility]opin at [facility] in [place], one month with 0 refills

## 2018-08-13 ENCOUNTER — Encounter

## 2018-08-13 MED ORDER — CLONAZEPAM 1 MG TAB
1 mg | ORAL_TABLET | ORAL | 0 refills | Status: DC | PRN
Start: 2018-08-13 — End: 2018-09-10

## 2018-08-13 NOTE — Telephone Encounter (Signed)
Left one month supply with 0 refill [facility] [place] [facility] [id number]

## 2018-09-10 ENCOUNTER — Ambulatory Visit: Attending: Neurology

## 2018-09-10 ENCOUNTER — Ambulatory Visit
Admit: 2018-09-10 | Discharge: 2018-09-10 | Payer: PRIVATE HEALTH INSURANCE | Attending: Neurology | Primary: Family Medicine

## 2018-09-10 DIAGNOSIS — R251 Tremor, unspecified: Secondary | ICD-10-CM

## 2018-09-10 MED ORDER — CLONAZEPAM 1 MG TAB
1 mg | ORAL_TABLET | ORAL | 2 refills | Status: DC | PRN
Start: 2018-09-10 — End: 2018-12-07
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
## 2018-10-31 ENCOUNTER — Emergency Department: Admit: 2018-11-01 | Payer: BLUE CROSS/BLUE SHIELD | Primary: Family Medicine

## 2018-10-31 DIAGNOSIS — S7001XA Contusion of right hip, initial encounter: Secondary | ICD-10-CM
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
## 2018-10-31 NOTE — ED Notes (Signed)
Alert female ambulated to ED for cc of Rt sided back pain after falling down the stairs at home, pt reports she took a hydrocodone tab from a friend for pain about 1/2 an hour ago. Pt denies loc, denies hitting head

## 2018-10-31 NOTE — ED Notes (Signed)
I have reviewed discharge instructions with the patient.  The patient verbalized understanding.

## 2018-10-31 NOTE — ED Triage Notes (Signed)
Alert female ambulated to ED for cc of Rt sided back pain after falling down the stairs at home, pt reports she took a hydrocodone tab from a friend for pain about 1/2 an hour ago. Pt denies loc, denies hitting head
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
## 2018-11-01 ENCOUNTER — Inpatient Hospital Stay
Admit: 2018-11-01 | Discharge: 2018-11-01 | Disposition: A | Discharge status: Home / Self Care | Payer: BLUE CROSS/BLUE SHIELD | Attending: Emergency Medicine

## 2018-11-01 MED ORDER — HYDROCODONE-ACETAMINOPHEN 5 MG-325 MG TAB
5-325 mg | ORAL_TABLET | Freq: Four times a day (QID) | ORAL | 0 refills | Status: AC | PRN
Start: 2018-11-01 — End: 2018-11-03

## 2018-11-01 MED ORDER — MORPHINE 2 MG/ML INJECTION
2 mg/mL | INTRAMUSCULAR | Status: AC
Start: 2018-11-01 — End: 2018-10-31
  Administered 2018-11-01: 01:00:00 via INTRAMUSCULAR

## 2018-11-01 MED ORDER — NAPROXEN 375 MG TAB
375 mg | ORAL_TABLET | Freq: Two times a day (BID) | ORAL | 0 refills | Status: AC
Start: 2018-11-01 — End: ?

## 2018-11-01 MED FILL — MORPHINE 2 MG/ML INJECTION: 2 mg/mL | INTRAMUSCULAR | Qty: 1

## 2018-12-07 ENCOUNTER — Encounter

## 2018-12-08 MED ORDER — CLONAZEPAM 1 MG TAB
1 mg | ORAL_TABLET | ORAL | 2 refills | Status: DC | PRN
Start: 2018-12-08 — End: 2019-02-08

## 2018-12-23 ENCOUNTER — Encounter: Attending: Neurology | Primary: Family Medicine

## 2018-12-31 ENCOUNTER — Encounter

## 2018-12-31 ENCOUNTER — Inpatient Hospital Stay: Admit: 2018-12-31 | Payer: BLUE CROSS/BLUE SHIELD | Primary: Family Medicine

## 2018-12-31 DIAGNOSIS — R0602 Shortness of breath: Secondary | ICD-10-CM

## 2019-02-08 ENCOUNTER — Encounter

## 2019-02-08 MED ORDER — CLONAZEPAM 1 MG TAB
1 mg | ORAL_TABLET | ORAL | 0 refills | Status: AC | PRN
Start: 2019-02-08 — End: ?

## 2019-04-06 ENCOUNTER — Encounter: Attending: Neurology | Primary: Family Medicine

## 2022-08-08 IMAGING — MR MRI CERVICAL SPINE WITHOUT CONTRAST
2 of 6 series · 5 of 48 positions shown · IV contrast (gadolinium)
Comparison: X-ray cervical spine from May 15, 2022.

MRI CERVICAL SPINE WITHOUT CONTRAST , 08/08/2022 [DATE]: 
CLINICAL INDICATION: Neck pain.
TECHNIQUE: Multiplanar, multiecho position MR images of the cervical spine were 
performed without intravenous gadolinium enhancement. Patient was scanned on a 
3.0 T magnet.

[Series 2: T2 · sagittal · 3.0mm · 0.23mm/px · 3 of 18 slices shown (1 of 2)]
[im 1/18]
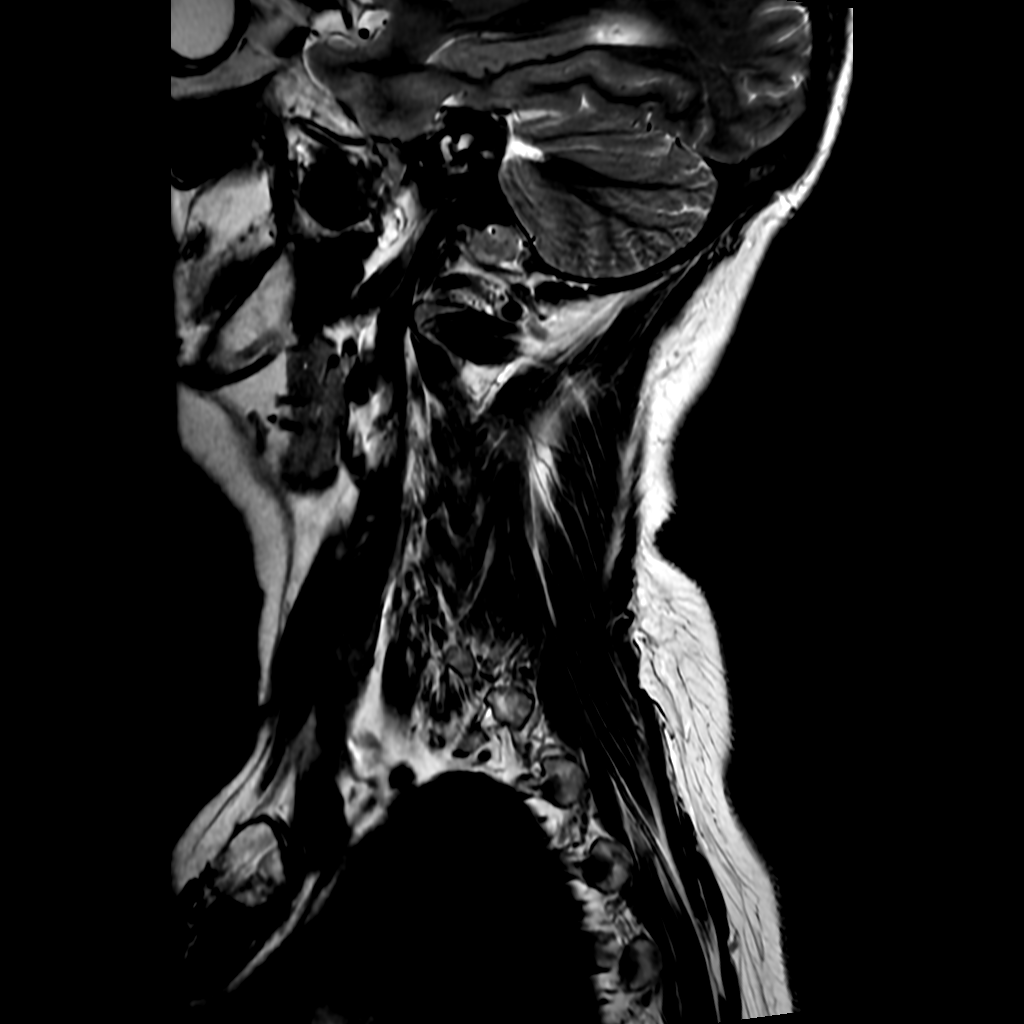
[im 12/18]
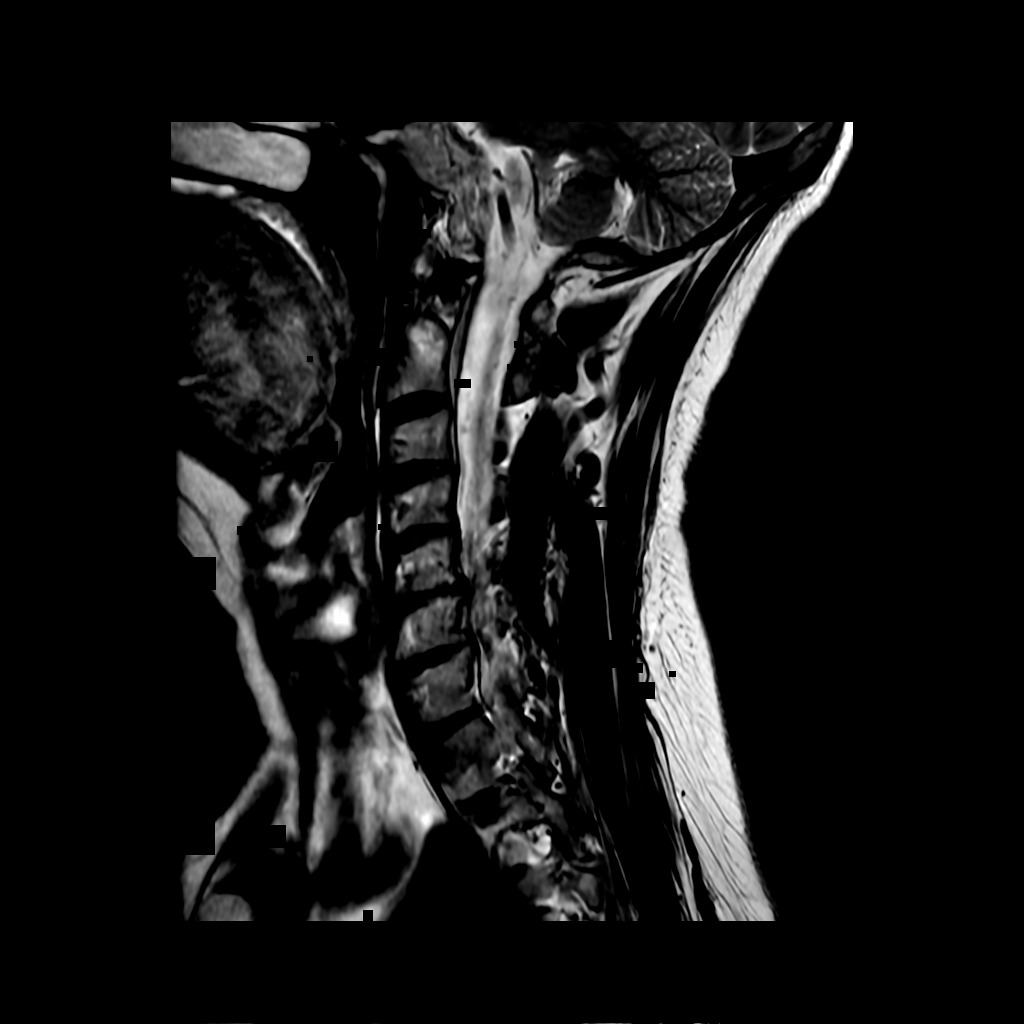
[im 18/18]
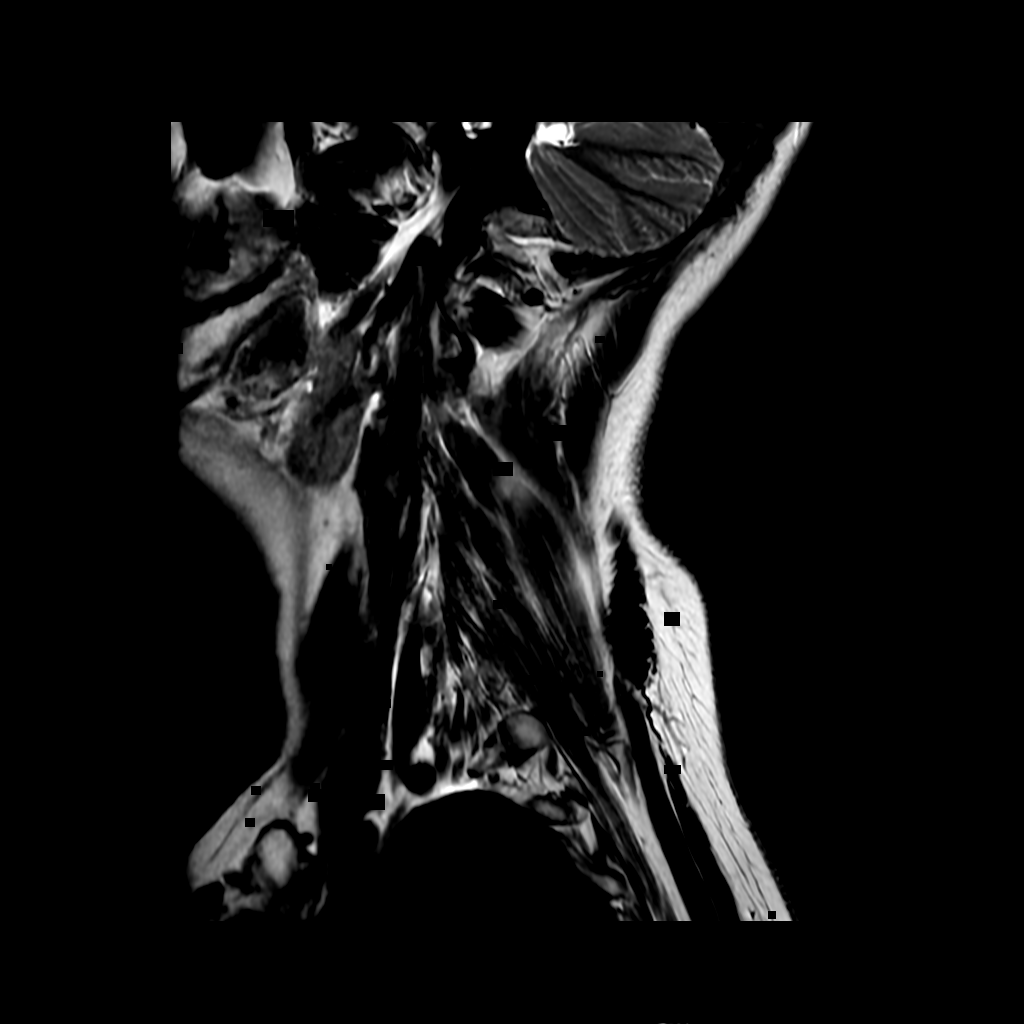

[Series 7: T2 · axial · 3.0mm · 0.14mm/px · z∈[-45,+11]mm · 2 of 18 slices shown (2 of 2)]
[im 1/18]
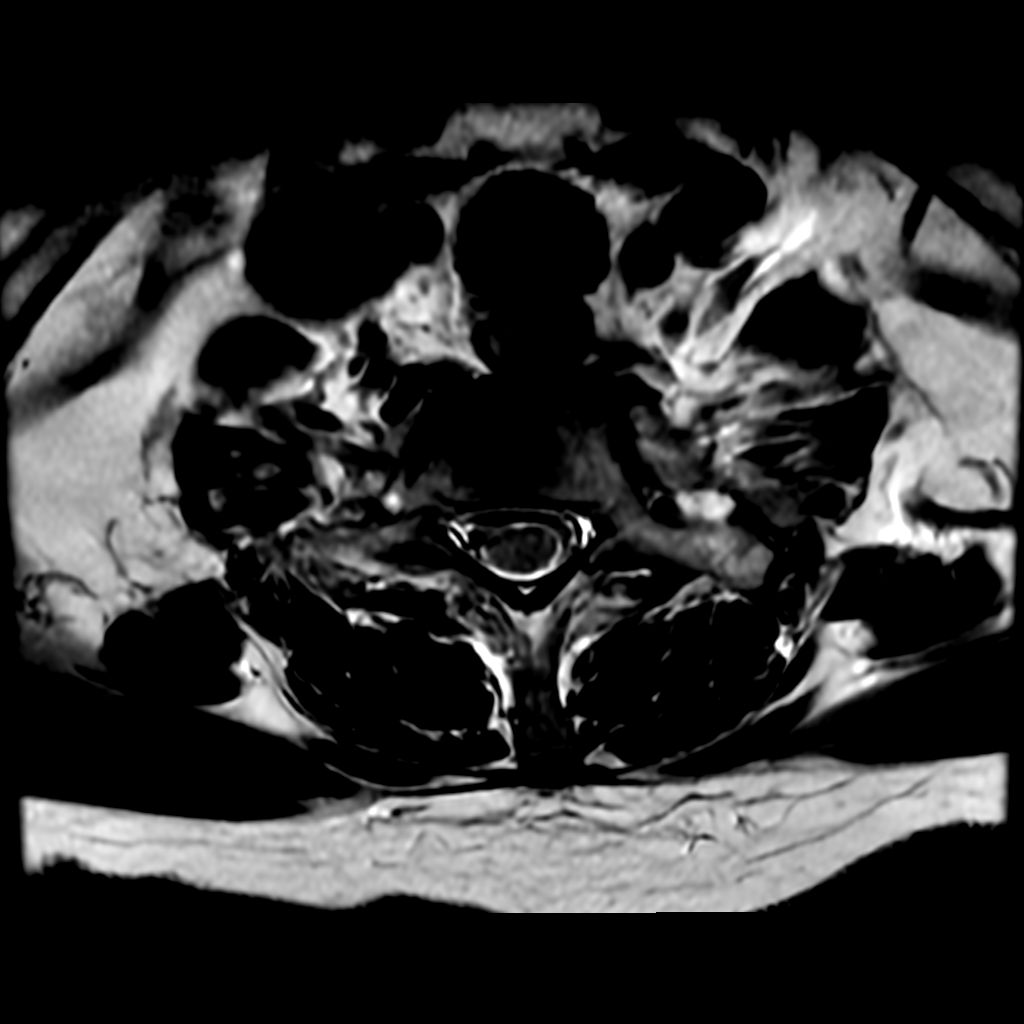
[im 12/18]
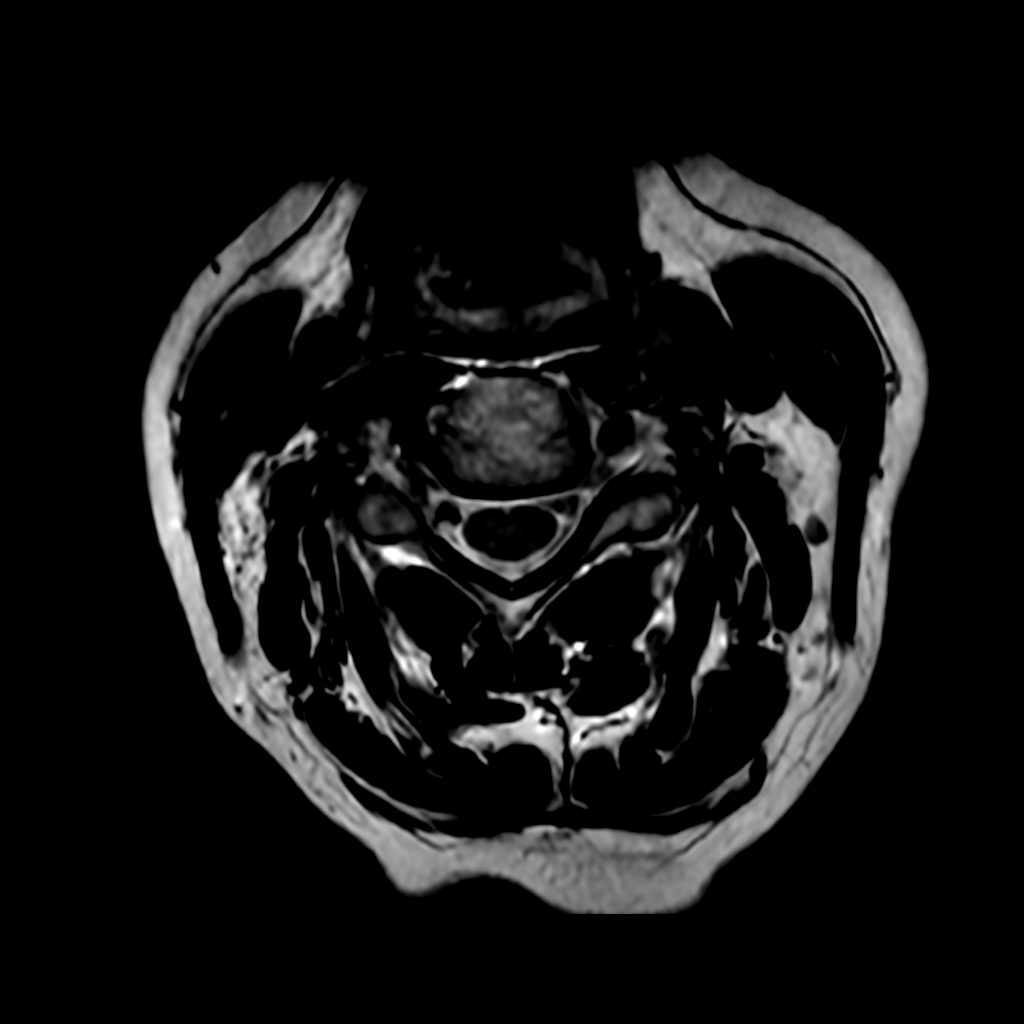

[5 of 48 positions shown; findings below may reference images not displayed]

FINDINGS: -------------------------------------------------------------------------------- 
----------------- 
GENERAL: 
ALIGNMENT: Normal. 
VERTEBRAL BODY HEIGHT: Normal.  
MARROW SIGNAL: No focal suspect signal abnormality. 
CORD SIGNAL: Normal.  
ADDITIONAL FINDINGS: None. 
-------------------------------------------------------------------------------- 
---------------- 
SEGMENTAL: 
CRANIOCERVICAL JUNCTION: No significant stenosis. 
C2-C3: No significant central canal or neural foraminal narrowing. 
C3-C4: Minimal disc osteophyte complex. Mild left uncovertebral joint 
hypertrophy. No significant central canal narrowing. No significant right neural 
foraminal narrowing. Mild left neural foraminal narrowing. 
C4-C5: Minimal disc osteophyte complex mild left uncovertebral joint 
hypertrophy. No significant central canal or neural foraminal narrowing. 
C5-C6: Disc osteophyte complex with bilateral uncovertebral joint hypertrophy. 
Mild central canal narrowing. Severe bilateral neural foraminal narrowing. 
C6-C7: Disc osteophyte complex with bilateral uncovertebral joint hypertrophy. 
Mild central canal narrowing. Severe bilateral neural foraminal narrowing. 
C7-T1: Left facet hypertrophy. No significant central canal or neural foraminal 
narrowing.  
-------------------------------------------------------------------------------- 
---------------
IMPRESSION: 1.  Discogenic/degenerative changes as above. 
2.  No moderate or severe central canal narrowing at any level.  
3.  Worst level(s) of neural foraminal narrowing: C5-C6 (severe bilateral), 
C6-C7 (severe bilateral).

## 2022-10-01 IMAGING — MR MRI LUMBAR WITHOUT CONTRAST
6 of 10 series · 29 of 48 positions shown · non-contrast
Comparison: 05/15/2022 radiographs

MRI LUMBAR WITHOUT CONTRAST , 10/01/2022 [DATE]: 
CLINICAL INDICATION: Degeneration of lumbar intervertebral disc.
TECHNIQUE: Multiplanar, multiecho position MR images of the lumbar spine were 
performed without contrast.

[Series 401: T2 · sagittal · 4.0mm · 0.59mm/px · 4 of 16 slices shown (1 of 3)]
[im 1/16]
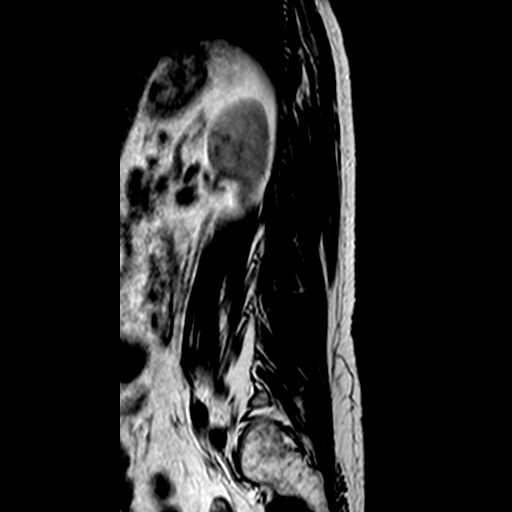
[im 6/16]
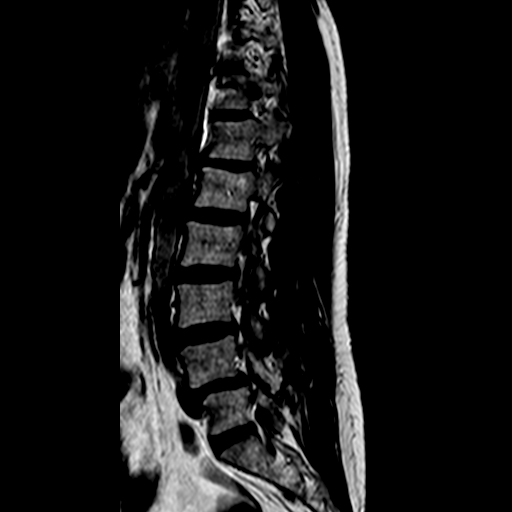
[im 11/16]
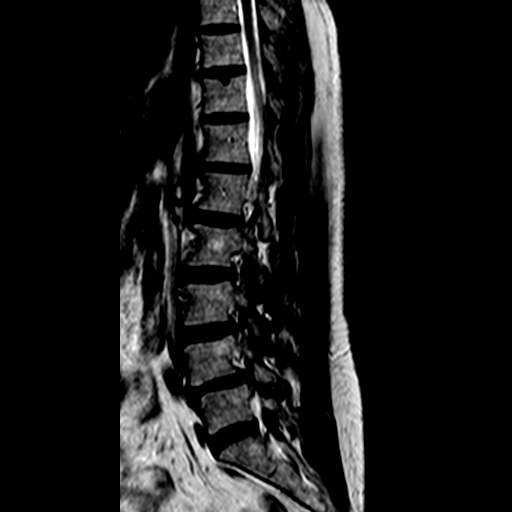
[im 16/16]
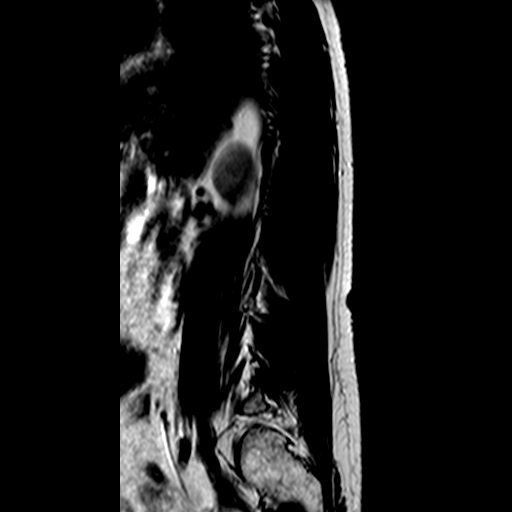

[Series 501: T1 · sagittal · 4.0mm · 0.58mm/px · 4 of 16 slices shown (1 of 3)]
[im 1/16]
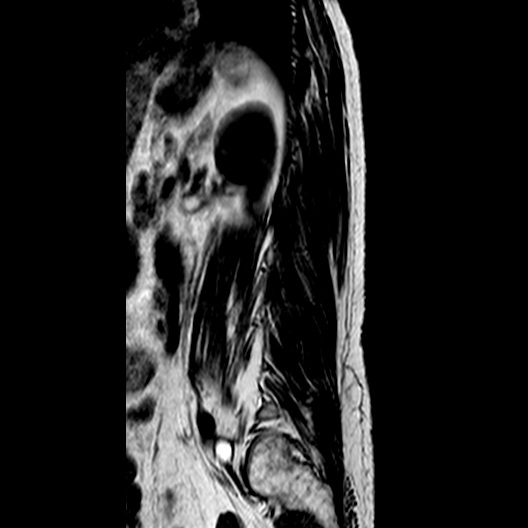
[im 6/16]
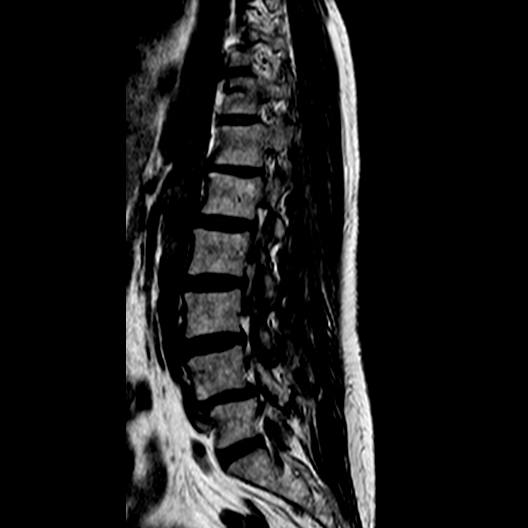
[im 11/16]
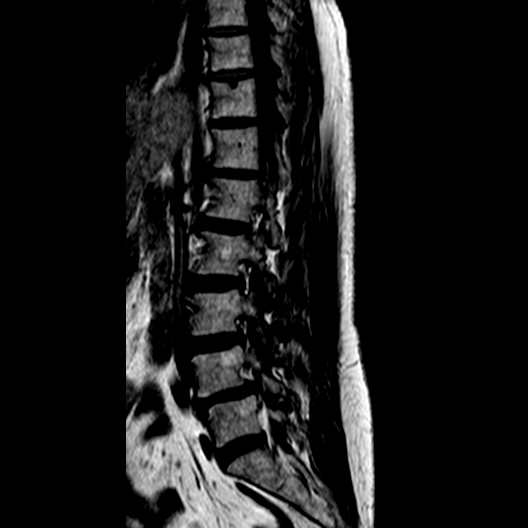
[im 16/16]
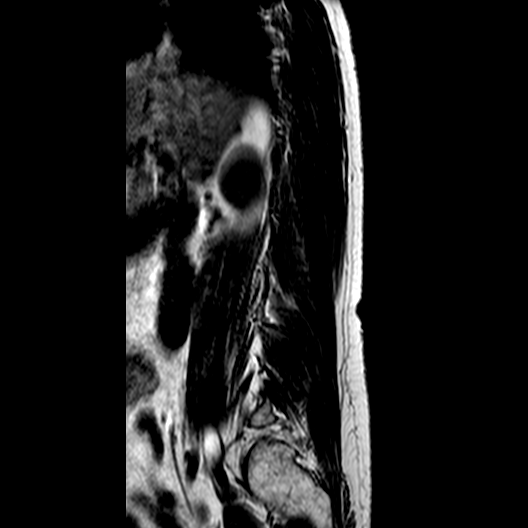

[Series 701: T2 · axial · 4.0mm · 0.48mm/px · z∈[-24,+95]mm · 7 of 28 slices shown (2 of 3)]
[im 1/28]
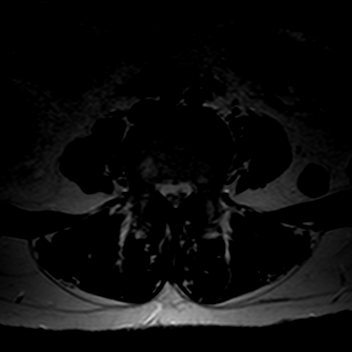
[im 5/28]
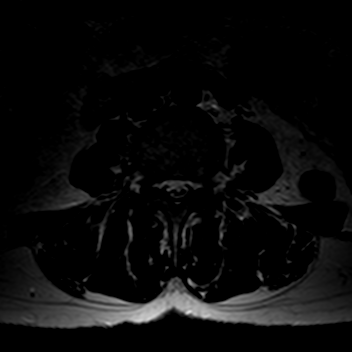
[im 10/28]
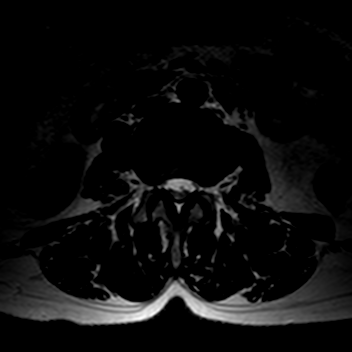
[im 14/28]
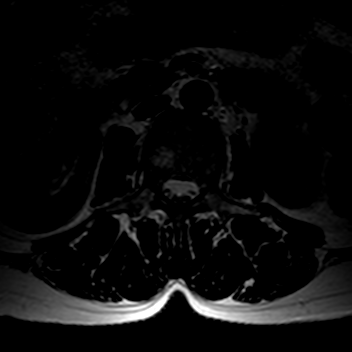
[im 19/28]
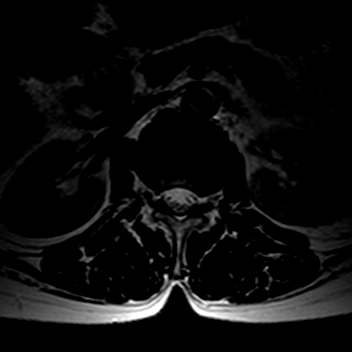
[im 23/28]
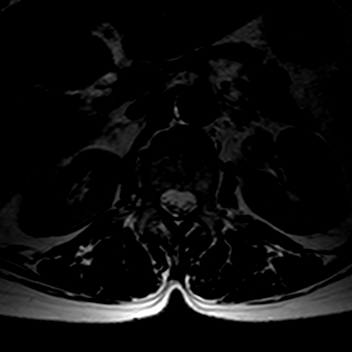
[im 28/28]
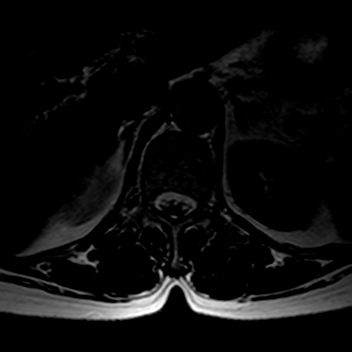

[Series 801: T2 · axial · 4.0mm · 0.48mm/px · z∈[-103,-12]mm · 6 of 22 slices shown (3 of 3)]
[im 1/22]
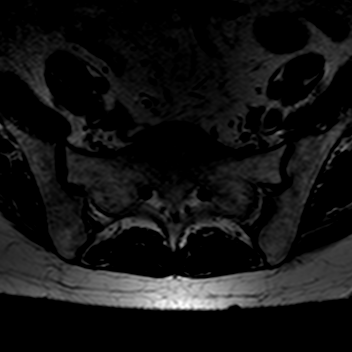
[im 5/22]
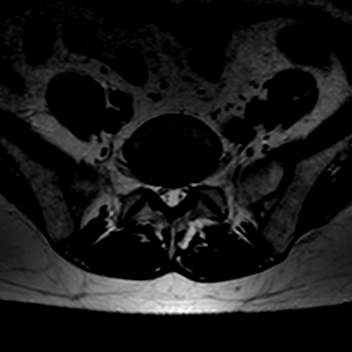
[im 9/22]
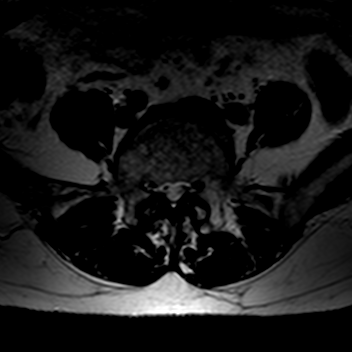
[im 13/22]
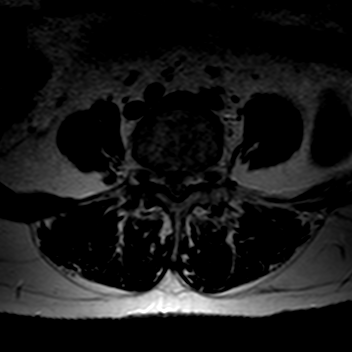
[im 17/22]
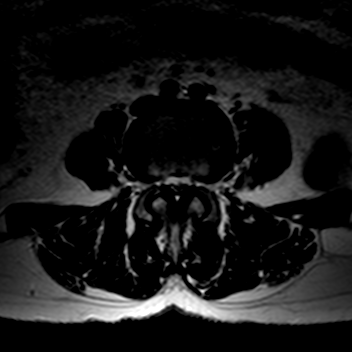
[im 22/22]
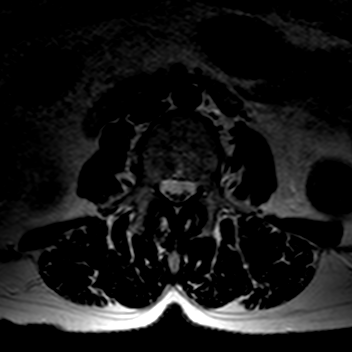

[Series 901: T1 · axial · 4.0mm · 0.48mm/px · z∈[-24,+95]mm · 7 of 28 slices shown (2 of 3)]
[im 1/28]
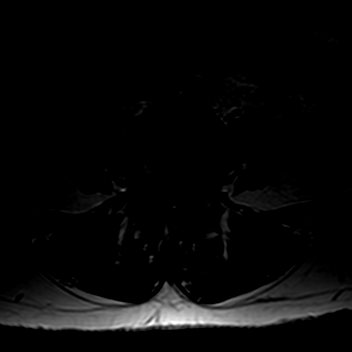
[im 5/28]
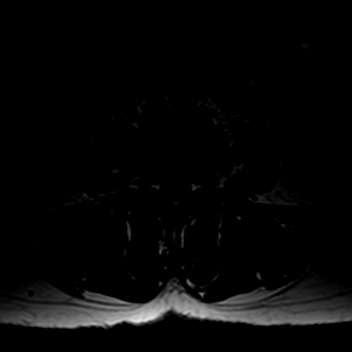
[im 10/28]
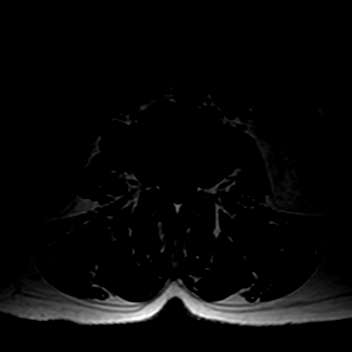
[im 14/28]
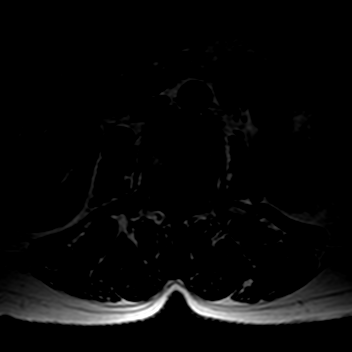
[im 19/28]
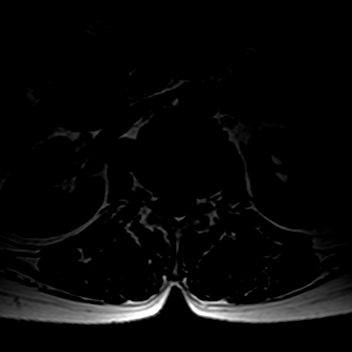
[im 23/28]
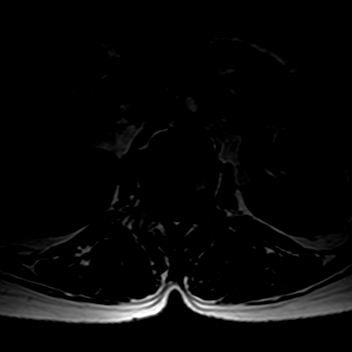
[im 28/28]
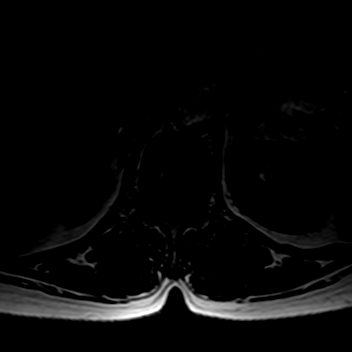

[Series 1001: T1 · axial · 4.0mm · 0.48mm/px · 1 of 22 slices shown (3 of 3)]
[im 1/22]
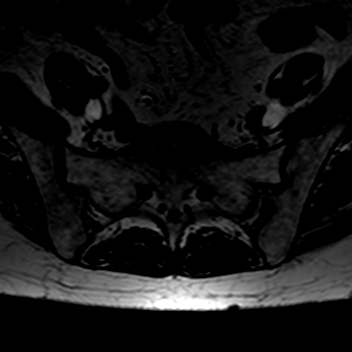

[29 of 48 positions shown; findings below may reference images not displayed]

FINDINGS: GENERAL: 
Nomenclature is based on 5 lumbar type vertebral bodies.     
ALIGNMENT: 0.4 cm anterolisthesis at L4-5. 
VERTEBRAE: No fractures. Multifocal degenerative osteophytes. 
MARROW SIGNAL: No focal suspect signal abnormality. 
CORD SIGNAL: Normal distal spinal cord and cauda equina. Conus medullaris 
terminates at L1. 
ADDITIONAL FINDINGS: None. 
Modic I-II: None. 
Ligamentum Flavum > 2.5 mm: All levels. 
SEGMENTAL: 
T12-L1: Mild left-sided disc bulge, mild disc space narrowing and disc 
desiccation. No herniation. Normal facets. No spinal canal or neural foraminal 
stenosis. 
L1-L2: Mild disc space narrowing and disc desiccation. No herniation. Normal 
facets. No spinal canal or neural foraminal stenosis. 
L2-L3: Normal disc height and signal. No herniation. Bilateral facet 
arthropathy. Mild central canal/lateral recess stenosis. No neural foraminal 
stenosis. 
L3-L4: Normal disc height with disc desiccation. No herniation. Moderate facet 
arthropathy. Mild central canal/lateral recess stenosis. Mild to moderate neural 
foraminal stenosis. 
L4-L5: 0.4 cm anterolisthesis. Broad-based central disc protrusion (superimposed 
upon disc bulge), moderate disc space narrowing and disc desiccation. Moderate 
facet arthropathy. Marked central canal stenosis (thecal sac measures 0.4 cm in 
AP dimensions) and lateral recess stenosis with encroachment on the L5 nerve 
roots. Moderate right and mild left neural foraminal stenosis. 
L5-S1: Normal disc height and signal. No herniation. Mild facet arthropathy. 
Mild central canal stenosis. No neural foraminal stenosis.
IMPRESSION: 1.  Multifocal degenerative change. 
2.  L4-L5 grade 1 anterolisthesis, central disc protrusion (superimposed upon 
disc bulge), marked central/lateral recess stenosis (with encroachment on the L5 
nerve roots) and moderate right/mild left neural foraminal stenosis. 
3.  L2-L3/L3-4 mild central canal stenosis, mild multilevel lateral recess 
stenosis and mild/moderate L3 neural foraminal stenosis.
